# Patient Record
Sex: Female | Born: 1965 | Hispanic: Yes | Marital: Married | Smoking: Never smoker
Health system: Southern US, Community
[De-identification: ages and names within clinical notes are randomized; demographics above are authoritative.]

## PROBLEM LIST (undated history)

## (undated) DIAGNOSIS — K589 Irritable bowel syndrome without diarrhea: Secondary | ICD-10-CM

## (undated) DIAGNOSIS — K219 Gastro-esophageal reflux disease without esophagitis: Secondary | ICD-10-CM

## (undated) DIAGNOSIS — K5909 Other constipation: Secondary | ICD-10-CM

## (undated) DIAGNOSIS — K5792 Diverticulitis of intestine, part unspecified, without perforation or abscess without bleeding: Secondary | ICD-10-CM

## (undated) DIAGNOSIS — M5416 Radiculopathy, lumbar region: Secondary | ICD-10-CM

## (undated) DIAGNOSIS — IMO0001 Reserved for inherently not codable concepts without codable children: Secondary | ICD-10-CM

## (undated) HISTORY — DX: Gastro-esophageal reflux disease without esophagitis: K21.9

## (undated) HISTORY — PX: OTHER SURGICAL HISTORY: SHX169

## (undated) HISTORY — DX: Reserved for inherently not codable concepts without codable children: IMO0001

## (undated) HISTORY — DX: Irritable bowel syndrome, unspecified: K58.9

## (undated) HISTORY — DX: Radiculopathy, lumbar region: M54.16

## (undated) HISTORY — DX: Diverticulitis of intestine, part unspecified, without perforation or abscess without bleeding: K57.92

## (undated) HISTORY — DX: Other constipation: K59.09

---

## 1998-07-12 HISTORY — PX: BUNIONECTOMY: SHX129

## 2009-04-24 ENCOUNTER — Other Ambulatory Visit: Admission: RE | Admit: 2009-04-24 | Discharge: 2009-04-24 | Payer: Self-pay | Admitting: *Deleted

## 2009-05-26 ENCOUNTER — Encounter: Admission: RE | Admit: 2009-05-26 | Discharge: 2009-05-26 | Payer: Self-pay | Admitting: *Deleted

## 2010-04-27 ENCOUNTER — Other Ambulatory Visit: Admission: RE | Admit: 2010-04-27 | Discharge: 2010-04-27 | Payer: Self-pay | Admitting: Family Medicine

## 2010-06-29 ENCOUNTER — Encounter
Admission: RE | Admit: 2010-06-29 | Discharge: 2010-06-29 | Payer: Self-pay | Source: Home / Self Care | Attending: Internal Medicine | Admitting: Internal Medicine

## 2010-07-12 HISTORY — PX: BIOPSY TEAR SAC: PRO35

## 2010-10-12 ENCOUNTER — Ambulatory Visit (INDEPENDENT_AMBULATORY_CARE_PROVIDER_SITE_OTHER): Payer: BC Managed Care – PPO | Admitting: Internal Medicine

## 2010-10-12 ENCOUNTER — Encounter: Payer: Self-pay | Admitting: Internal Medicine

## 2010-10-12 VITALS — BP 118/82 | HR 85 | Temp 98.6°F | Wt 127.8 lb

## 2010-10-12 DIAGNOSIS — R51 Headache: Secondary | ICD-10-CM

## 2010-10-12 MED ORDER — GABAPENTIN 300 MG PO CAPS: 300.0000 mg | ORAL_CAPSULE | ORAL | Status: DC

## 2010-10-12 NOTE — Progress Notes (Signed)
  Subjective:    Patient ID: Karen Case, female    DOB: 21-Nov-1965, 45 y.o.   MRN: [id number]  HPI New patient Symptoms started about 2 months ago, she developed pain at the right upper eye lid. Along with the pain, she has tingling and a "anesthesia sensation" around the right eye, right cheek, right side of the face and neck. She went to her primary doctor, was prescribed antibiotics and nasal steroids, then refer to ophthalmologist who prescribed antibiotics by mouth. As the symptoms did not subside, she was referred to a sub-specialist ophthalmologist in [place], they just did a CT, chest x-ray, blood work and a PPD. They're talking about surgery to see "what is wrong" according to the patient. She is here for my opinion  Past Medical History  Diagnosis Date  . Birth control     husband's vasectomy   Past Surgical History  Procedure Date  . Bunionectomy 2000    Right   History   Social History  . Marital Status: Married    Spouse Name: N/A    Number of Children: N/A  . Years of Education: N/A   Occupational History  . home    . student     Social History Main Topics  . Smoking status: Never Smoker   . Smokeless tobacco: Not on file  . Alcohol Use: 0.0 oz/week  . Drug Use: Not on file  . Sexually Active: Not on file   Other Topics Concern  . Not on file   Social History Narrative   Original from [place], moved to [place] in 1994,  2 children       Review of Systems No fever or recent weight loss No history of facial rash No actual sinus symptoms like runny nose, sore throat or sinus congestion Some bilateral ear pressure and No neck pain per se although the tingling involves part of the neck      Objective:   Physical Exam Alert, oriented x3. Face is symmetric except for the right upper eyelid is slightly larger than the left. External ocular movements intact  throat is normal, uvula midline. Ears: Tympanic membranes normal TMJs without click Lungs  are clear to auscultation bilaterally CV: RRR, no murmur        Assessment & Plan:

## 2010-10-12 NOTE — Assessment & Plan Note (Signed)
Right eye lid pain along with a tingling feeling in the right face; ophthalmology workup in progress and according to the patient w/u negative so far I recommended patient to followup with ophthalmology but at the same time her symptoms suggest neuralgia.   Plan: Neurology referral Trial with Neurontin at bedtime (pain is worse at night)

## 2010-10-12 NOTE — Patient Instructions (Signed)
Call in 1 week, let me know how you are doing  Call if side effects

## 2010-11-06 ENCOUNTER — Other Ambulatory Visit: Payer: Self-pay | Admitting: Physician Assistant

## 2010-11-06 ENCOUNTER — Other Ambulatory Visit: Payer: Self-pay | Admitting: Unknown Physician Specialty

## 2010-11-06 DIAGNOSIS — IMO0002 Reserved for concepts with insufficient information to code with codable children: Secondary | ICD-10-CM

## 2010-11-09 ENCOUNTER — Ambulatory Visit
Admission: RE | Admit: 2010-11-09 | Discharge: 2010-11-09 | Disposition: A | Discharge status: Home / Self Care | Payer: BC Managed Care – PPO | Source: Ambulatory Visit | Attending: Physician Assistant | Admitting: Physician Assistant

## 2010-11-09 DIAGNOSIS — IMO0002 Reserved for concepts with insufficient information to code with codable children: Secondary | ICD-10-CM

## 2010-11-09 MED ORDER — GADOBENATE DIMEGLUMINE 529 MG/ML IV SOLN
11.0000 mL | Freq: Once | INTRAVENOUS | Status: AC | PRN
  Administered 2010-11-09: 11 mL via INTRAVENOUS

## 2010-12-21 ENCOUNTER — Ambulatory Visit (INDEPENDENT_AMBULATORY_CARE_PROVIDER_SITE_OTHER): Payer: BC Managed Care – PPO | Admitting: Internal Medicine

## 2010-12-21 ENCOUNTER — Encounter: Payer: Self-pay | Admitting: Internal Medicine

## 2010-12-21 DIAGNOSIS — R51 Headache: Secondary | ICD-10-CM

## 2010-12-21 MED ORDER — HYDROCODONE-ACETAMINOPHEN 2.5-500 MG PO TABS: 1.0000 | ORAL_TABLET | Freq: Four times a day (QID) | ORAL | Status: DC | PRN

## 2010-12-21 NOTE — Patient Instructions (Signed)
Tylenol-motrin for ear pain If the pain continue, use hydrocodone (drowsinress) Nasonex: 2 sprays on each side of the nose daily, call for a refill if needed

## 2010-12-21 NOTE — Assessment & Plan Note (Addendum)
Presents today with right otalgia and R>L sinus pressure The patient was recently diagnosed with possible trigeminal neuralgia, on Neurontin. She also just had a biopsy of the right lacrimal gland. She does not seem to have sinusitis, otitis or a TMJ issue. No dental problems that I can tell. Otalgia may be related to primary process which is neuralgia. Plan: Nasonex samples to alleviate the sinus symptoms Tylenol and Motrin, hydrocodone if pain continued. Patient will call if symptoms change, fever. nasal d/c [facility] Neu[facility]

## 2010-12-21 NOTE — Progress Notes (Signed)
  Subjective:    Patient ID: Karen Case, female    DOB: 03-18-1966, 45 y.o.   MRN: [id number]  HPI Today she complains of feet and had edema, feels like she is "retaining water" Also for the last day, has developed severe right otalgia described as "fluid in the ear" She was recently seen with the right facial pain, saw neurology, working diagnosis is neuralgia, currently on Neurontin. Had MRI of the brain recently ordered by neurology, reportedly it was okay but needs to be repeated in 3 months d/t some minor abnormalities (according to the patient). She also had a right lacrimal gland biopsy 12-16-10 under general anesthesia, results pending.  Past Medical History  Diagnosis Date  . Birth control     husband's vasectomy   Past Surgical History  Procedure Date  . Bunionectomy 2000    Right    Review of Systems Denies fevers Occasional dizziness, no cough, no nasal discharge, no URI type symptoms. No dental pain. Some discomfort in the face, R>L      Objective:   Physical Exam  Constitutional: She appears well-developed and well-nourished. No distress.  HENT:  Head: Normocephalic and atraumatic.  Right Ear: External ear normal.  Left Ear: External ear normal.  Nose: Nose normal.  Mouth/Throat: No oropharyngeal exudate.       TMJ without click or tenderness. Percussion of the teeth caused no pain. Face is symmetric, slightly tender over the right maxillary sinus  Neck: Normal range of motion. Neck supple.  Musculoskeletal: She exhibits no edema.  Skin: She is not diaphoretic.       No facial or otic rash           Assessment & Plan:  Reports feet and hand edema, on exam there is no objective evidence of pitting edema. Recommend observation and a low salt diet.

## 2011-01-21 ENCOUNTER — Other Ambulatory Visit: Payer: Self-pay | Admitting: Unknown Physician Specialty

## 2011-01-21 ENCOUNTER — Other Ambulatory Visit: Payer: Self-pay | Admitting: *Deleted

## 2011-01-21 DIAGNOSIS — R51 Headache: Secondary | ICD-10-CM

## 2011-01-21 DIAGNOSIS — R930 Abnormal findings on diagnostic imaging of skull and head, not elsewhere classified: Secondary | ICD-10-CM

## 2011-02-09 ENCOUNTER — Ambulatory Visit
Admission: RE | Admit: 2011-02-09 | Discharge: 2011-02-09 | Disposition: A | Discharge status: Home / Self Care | Payer: BC Managed Care – PPO | Source: Ambulatory Visit | Attending: *Deleted | Admitting: *Deleted

## 2011-02-09 ENCOUNTER — Other Ambulatory Visit: Payer: BC Managed Care – PPO

## 2011-02-09 DIAGNOSIS — R51 Headache: Secondary | ICD-10-CM

## 2011-02-09 DIAGNOSIS — R930 Abnormal findings on diagnostic imaging of skull and head, not elsewhere classified: Secondary | ICD-10-CM

## 2011-02-09 MED ORDER — GADOBENATE DIMEGLUMINE 529 MG/ML IV SOLN
12.0000 mL | Freq: Once | INTRAVENOUS | Status: AC | PRN
  Administered 2011-02-09: 12 mL via INTRAVENOUS

## 2011-06-14 ENCOUNTER — Other Ambulatory Visit: Payer: Self-pay | Admitting: Internal Medicine

## 2011-06-14 DIAGNOSIS — Z1231 Encounter for screening mammogram for malignant neoplasm of breast: Secondary | ICD-10-CM

## 2011-06-29 ENCOUNTER — Encounter: Payer: Self-pay | Admitting: Internal Medicine

## 2011-06-30 ENCOUNTER — Ambulatory Visit (INDEPENDENT_AMBULATORY_CARE_PROVIDER_SITE_OTHER): Payer: BC Managed Care – PPO | Admitting: Internal Medicine

## 2011-06-30 ENCOUNTER — Other Ambulatory Visit: Payer: Self-pay | Admitting: Internal Medicine

## 2011-06-30 ENCOUNTER — Encounter: Payer: Self-pay | Admitting: Internal Medicine

## 2011-06-30 DIAGNOSIS — R519 Headache, unspecified: Secondary | ICD-10-CM

## 2011-06-30 DIAGNOSIS — Z23 Encounter for immunization: Secondary | ICD-10-CM

## 2011-06-30 DIAGNOSIS — R1013 Epigastric pain: Secondary | ICD-10-CM

## 2011-06-30 DIAGNOSIS — M542 Cervicalgia: Secondary | ICD-10-CM

## 2011-06-30 DIAGNOSIS — K5909 Other constipation: Secondary | ICD-10-CM

## 2011-06-30 DIAGNOSIS — Z0000 Encounter for general adult medical examination without abnormal findings: Secondary | ICD-10-CM

## 2011-06-30 HISTORY — DX: Other constipation: K59.09

## 2011-06-30 MED ORDER — HYDROCODONE-ACETAMINOPHEN 2.5-500 MG PO TABS: 1.0000 | ORAL_TABLET | Freq: Four times a day (QID) | ORAL | Status: AC | PRN

## 2011-06-30 NOTE — Progress Notes (Signed)
  Subjective:    Patient ID: Karen Case, female    DOB: 1966-06-07, 45 y.o.   MRN: [id number]  HPI CPX X 2 months having early saciety-mild nausea- upper abdominal bloating feeling after meals, sx are worse if she over eats. Not taking nsaids  Past Medical History  Diagnosis Date  . Birth control     husband's vasectomy   Past Surgical History  Procedure Date  . Bunionectomy 2000    Right  . Skin biopsy     R eye lid    History   Social History  . Marital Status: Married    Spouse Name: N/A    Number of Children: 2  . Years of Education: N/A   Occupational History  . home    . student     Social History Main Topics  . Smoking status: Never Smoker   . Smokeless tobacco: Never Used  . Alcohol Use: 0.0 oz/week     socially   . Drug Use: No  . Sexually Active: Not on file   Other Topics Concern  . Not on file   Social History Narrative   Original from [place], moved to [place] in 1994 2 children     Family History  Problem Relation Age of Onset  . Hypertension      M  . Hyperlipidemia Neg Hx   . Coronary artery disease Neg Hx   . Stroke Neg Hx   . Colon cancer Neg Hx   . Breast cancer Neg Hx      Review of Systems No f/c, slt decreased appetite, no wt loss No CP (occ discomfort anteriorly when she stretches) No cough-SOB No vomiting-diarrhea-blood in stools occ has LLQ mild pain Periods irregular  occ GERD sx x 2 months No anxiety-depression    Objective:   Physical Exam  Constitutional: She is oriented to person, place, and time. She appears well-developed and well-nourished. No distress.  HENT:  Head: Normocephalic and atraumatic.  Neck: No thyromegaly present.  Cardiovascular: Normal rate, regular rhythm and normal heart sounds.   No murmur heard. Pulmonary/Chest: Effort normal and breath sounds normal. No respiratory distress. She has no wheezes. She exhibits no tenderness.  Abdominal: Soft. She exhibits no distension and no mass. There is no  rebound and no guarding.    Musculoskeletal: She exhibits no edema.  Neurological: She is alert and oriented to person, place, and time.  Skin: She is not diaphoretic.  Psychiatric: She has a normal mood and affect. Her behavior is normal. Judgment and thought content normal.       Assessment & Plan:

## 2011-06-30 NOTE — Assessment & Plan Note (Signed)
Today he also complains of neck pain on and off for a while, she was prescribed hydrocodone actually for facial pain but when she took it the neck pain responded very well. Request a prescription for hydrocodone, it was provided. Counseled about the possible overuse.

## 2011-06-30 NOTE — Patient Instructions (Addendum)
Please came back fasting: FLP CMP CBC TSH--- dx v70 prilosec every day x 6 weeks, if the stomach problem continue or if it gets worse: please call , will schedule a ultrasound

## 2011-06-30 NOTE — Assessment & Plan Note (Signed)
Td today Flu shot today Never had a cscope To see gyn soon, encouraged to discuss irregular periods and episodic RLQ pain (?ovulation) Has a MMG schedule  Diet-exercise discussed

## 2011-06-30 NOTE — Assessment & Plan Note (Signed)
Sx c/w dyspepsia Trial w/ prilosec If no better consider GB u/s and GI referral

## 2011-07-01 ENCOUNTER — Encounter: Payer: Self-pay | Admitting: Internal Medicine

## 2011-07-01 ENCOUNTER — Other Ambulatory Visit (INDEPENDENT_AMBULATORY_CARE_PROVIDER_SITE_OTHER): Payer: BC Managed Care – PPO

## 2011-07-01 DIAGNOSIS — Z0000 Encounter for general adult medical examination without abnormal findings: Secondary | ICD-10-CM

## 2011-07-01 LAB — COMPREHENSIVE METABOLIC PANEL
CO2: 30 mEq/L (ref 19–32)
Calcium: 9.2 mg/dL (ref 8.4–10.5)
Chloride: 104 mEq/L (ref 96–112)
GFR: 96.32 mL/min (ref >60.00)
Glucose, Bld: 95 mg/dL (ref 70–99)
Potassium: 3.8 mEq/L (ref 3.5–5.1)
Sodium: 141 mEq/L (ref 135–145)

## 2011-07-01 LAB — LIPID PANEL
Cholesterol: 178 mg/dL (ref 0–200)
LDL Cholesterol: 106 mg/dL — ABNORMAL HIGH (ref 0–99)
Total CHOL/HDL Ratio: 3

## 2011-07-01 LAB — CBC WITH DIFFERENTIAL/PLATELET
Eosinophils Relative: 1.6 % (ref 0.0–5.0)
HCT: 37.5 % (ref 36.0–46.0)
Hemoglobin: 13.1 g/dL (ref 12.0–15.0)
Lymphocytes Relative: 18.4 % (ref 12.0–46.0)
MCV: 95.8 fl (ref 78.0–100.0)
Monocytes Relative: 9.5 % (ref 3.0–12.0)
Neutrophils Relative %: 70.2 % (ref 43.0–77.0)
RBC: 3.91 Mil/uL (ref 3.87–5.11)
RDW: 12.2 % (ref 11.5–14.6)

## 2011-07-04 ENCOUNTER — Encounter: Payer: Self-pay | Admitting: Internal Medicine

## 2011-07-07 ENCOUNTER — Ambulatory Visit: Payer: BC Managed Care – PPO

## 2011-07-29 ENCOUNTER — Other Ambulatory Visit (HOSPITAL_COMMUNITY)
Admission: RE | Admit: 2011-07-29 | Discharge: 2011-07-29 | Disposition: A | Discharge status: Home / Self Care | Payer: BC Managed Care – PPO | Source: Ambulatory Visit | Attending: Obstetrics and Gynecology | Admitting: Obstetrics and Gynecology

## 2011-07-29 ENCOUNTER — Other Ambulatory Visit: Payer: Self-pay | Admitting: Obstetrics and Gynecology

## 2011-07-29 ENCOUNTER — Ambulatory Visit
Admission: RE | Admit: 2011-07-29 | Discharge: 2011-07-29 | Disposition: A | Discharge status: Home / Self Care | Payer: BC Managed Care – PPO | Source: Ambulatory Visit | Attending: Internal Medicine | Admitting: Internal Medicine

## 2011-07-29 DIAGNOSIS — Z1231 Encounter for screening mammogram for malignant neoplasm of breast: Secondary | ICD-10-CM

## 2011-07-29 DIAGNOSIS — Z01419 Encounter for gynecological examination (general) (routine) without abnormal findings: Secondary | ICD-10-CM | POA: Insufficient documentation

## 2012-07-28 ENCOUNTER — Other Ambulatory Visit: Payer: Self-pay | Admitting: Internal Medicine

## 2012-07-28 DIAGNOSIS — Z1231 Encounter for screening mammogram for malignant neoplasm of breast: Secondary | ICD-10-CM

## 2012-07-31 ENCOUNTER — Ambulatory Visit
Admission: RE | Admit: 2012-07-31 | Discharge: 2012-07-31 | Disposition: A | Discharge status: Home / Self Care | Payer: BC Managed Care – PPO | Source: Ambulatory Visit | Attending: Internal Medicine | Admitting: Internal Medicine

## 2012-07-31 DIAGNOSIS — Z1231 Encounter for screening mammogram for malignant neoplasm of breast: Secondary | ICD-10-CM

## 2012-08-11 ENCOUNTER — Other Ambulatory Visit (HOSPITAL_COMMUNITY)
Admission: RE | Admit: 2012-08-11 | Discharge: 2012-08-11 | Disposition: A | Discharge status: Home / Self Care | Payer: BC Managed Care – PPO | Source: Ambulatory Visit | Attending: Obstetrics and Gynecology | Admitting: Obstetrics and Gynecology

## 2012-08-11 ENCOUNTER — Other Ambulatory Visit: Payer: Self-pay | Admitting: Obstetrics and Gynecology

## 2012-08-11 DIAGNOSIS — Z01419 Encounter for gynecological examination (general) (routine) without abnormal findings: Secondary | ICD-10-CM | POA: Insufficient documentation

## 2012-08-11 DIAGNOSIS — Z1151 Encounter for screening for human papillomavirus (HPV): Secondary | ICD-10-CM | POA: Insufficient documentation

## 2012-08-16 ENCOUNTER — Encounter: Payer: Self-pay | Admitting: Internal Medicine

## 2012-08-16 ENCOUNTER — Ambulatory Visit (INDEPENDENT_AMBULATORY_CARE_PROVIDER_SITE_OTHER): Payer: BC Managed Care – PPO | Admitting: Internal Medicine

## 2012-08-16 VITALS — BP 118/74 | HR 86 | Temp 98.1°F | Ht 63.25 in | Wt 127.0 lb

## 2012-08-16 DIAGNOSIS — Z0000 Encounter for general adult medical examination without abnormal findings: Secondary | ICD-10-CM

## 2012-08-16 DIAGNOSIS — R1013 Epigastric pain: Secondary | ICD-10-CM

## 2012-08-16 MED ORDER — OMEPRAZOLE 40 MG PO CPDR: 40.0000 mg | DELAYED_RELEASE_CAPSULE | Freq: Every day | ORAL | Status: DC

## 2012-08-16 NOTE — Assessment & Plan Note (Addendum)
Continue with several GI symptoms: GERD, chronic constipation, feeling bloated. Plan: Take PPIs consistently, increase omeprazole from 20-40 mg.  Metamucil and MiraLax daily. Refer to GI --->  IBS?

## 2012-08-16 NOTE — Progress Notes (Signed)
  Subjective:    Patient ID: Karen Case, female    DOB: 03-Oct-1965, 47 y.o.   MRN: [id number]  HPI Complete physical exam  Past Medical History  Diagnosis Date  . Birth control     husband's vasectomy   Past Surgical History  Procedure Date  . Bunionectomy 2000    Right  . Skin biopsy     R eye lid    History   Social History  . Marital Status: Married    Spouse Name: N/A    Number of Children: 2  . Years of Education: N/A   Occupational History  . home , student   .      Social History Main Topics  . Smoking status: Never Smoker   . Smokeless tobacco: Never Used  . Alcohol Use: 0.0 oz/week     Comment: socially   . Drug Use: No  . Sexually Active: Not on file   Other Topics Concern  . Not on file   Social History Narrative   Original from [place], moved to [place] in 1994 ----2 daughters ---Diet: healthy ---Exercise: still active   Family History  Problem Relation Age of Onset  . Hypertension      M  . Hyperlipidemia Neg Hx   . Coronary artery disease Neg Hx   . Stroke Neg Hx   . Colon cancer Neg Hx   . Breast cancer Neg Hx      Review of Systems In general feels well, she still has acid reflux very frequently. She tried Prilosec 20 mg which helped but she stopped taking it consistently. Also has chronic constipation, occasionally gets really constipated and takes Dulcolax. She feels bloated frequently. Denies any nausea, vomiting or blood per rectum. No depression or anxiety. No fever chills No chest pain or shortness of breath. Next    Objective:   Physical Exam General -- alert, well-developed, and well-nourished.   Neck --no thyromegaly  Lungs -- normal respiratory effort, no intercostal retractions, no accessory muscle use, and normal breath sounds.   Heart-- normal rate, regular rhythm, no murmur, and no gallop.   Abdomen--soft, non-tender, no distention, no masses, no HSM, no guarding, and no rigidity.   Extremities-- no pretibial edema  bilaterally  Neurologic-- alert & oriented X3 and strength normal in all extremities. Psych-- Cognition and judgment appear intact. Alert and cooperative with normal attention span and concentration.  not anxious appearing and not depressed appearing.       Assessment & Plan:

## 2012-08-16 NOTE — Patient Instructions (Signed)
Please come back fasting: FLP, TSH, CBC, BMP--- dx V70 ---- MiraLax 17 g OTC 17 every day with lots of fluids Metamucil capsules, 2 a day.

## 2012-08-16 NOTE — Assessment & Plan Note (Addendum)
Td 2012 Never had a cscope Female care per gyn, MMG (-) 07-2012, PAP last week   Patient reports she was diagnosed with premenopause, taking BCPs, despite that she  has only 2 or 3 periods a year. Interestingly, her headaches are gone since she started [facility] Diet-exercise discussed  Labs

## 2012-08-17 ENCOUNTER — Encounter: Payer: Self-pay | Admitting: Gastroenterology

## 2012-08-17 ENCOUNTER — Other Ambulatory Visit (INDEPENDENT_AMBULATORY_CARE_PROVIDER_SITE_OTHER): Payer: BC Managed Care – PPO

## 2012-08-17 DIAGNOSIS — Z0000 Encounter for general adult medical examination without abnormal findings: Secondary | ICD-10-CM

## 2012-08-17 LAB — CBC WITH DIFFERENTIAL/PLATELET
Basophils Absolute: 0 10*3/uL (ref 0.0–0.1)
Eosinophils Relative: 2.5 % (ref 0.0–5.0)
Hemoglobin: 12.7 g/dL (ref 12.0–15.0)
MCHC: 33.9 g/dL (ref 30.0–36.0)
Monocytes Absolute: 0.3 10*3/uL (ref 0.1–1.0)
Monocytes Relative: 8 % (ref 3.0–12.0)
Neutro Abs: 2.4 10*3/uL (ref 1.4–7.7)
RDW: 12.8 % (ref 11.5–14.6)
WBC: 4.1 10*3/uL — ABNORMAL LOW (ref 4.5–10.5)

## 2012-08-17 LAB — LIPID PANEL
Cholesterol: 169 mg/dL (ref 0–200)
HDL: 59.1 mg/dL (ref >39.00)
LDL Cholesterol: 97 mg/dL (ref 0–99)
Total CHOL/HDL Ratio: 3
VLDL: 12.6 mg/dL (ref 0.0–40.0)

## 2012-08-17 LAB — BASIC METABOLIC PANEL
CO2: 28 mEq/L (ref 19–32)
Chloride: 101 mEq/L (ref 96–112)
Creatinine, Ser: 0.8 mg/dL (ref 0.4–1.2)
GFR: 77.65 mL/min (ref >60.00)
Glucose, Bld: 79 mg/dL (ref 70–99)

## 2012-08-17 LAB — TSH: TSH: 0.93 u[IU]/mL (ref 0.35–5.50)

## 2012-08-21 ENCOUNTER — Encounter: Payer: Self-pay | Admitting: *Deleted

## 2012-09-04 ENCOUNTER — Ambulatory Visit (INDEPENDENT_AMBULATORY_CARE_PROVIDER_SITE_OTHER): Payer: BC Managed Care – PPO | Admitting: Gastroenterology

## 2012-09-04 ENCOUNTER — Encounter: Payer: Self-pay | Admitting: Gastroenterology

## 2012-09-04 VITALS — BP 90/60 | HR 80

## 2012-09-04 DIAGNOSIS — K5900 Constipation, unspecified: Secondary | ICD-10-CM

## 2012-09-04 DIAGNOSIS — R109 Unspecified abdominal pain: Secondary | ICD-10-CM

## 2012-09-04 MED ORDER — LINACLOTIDE 145 MCG PO CAPS: 145.0000 ug | ORAL_CAPSULE | Freq: Every day | ORAL | Status: DC

## 2012-09-04 NOTE — Progress Notes (Signed)
History of Present Illness: This is a 47 year old female who has chronic constipation. She her symptoms have been present for many years and have worsened over the past few years. She reports epigastric pain and lower abdominal pain, occasionally her symptoms are burning. Pain is not routinely relieved with a bowel movement. She notes bloating and increased gas as well. Omeprazole has not been effective in controlling her symptoms. She tried MiraLax daily which was not effective. She tried Ducolax which led to abdominal cramping. Recent blood work obtained by Dr. Drue Novel was unremarkable. Denies weight loss, diarrhea, change in stool caliber, melena, hematochezia, nausea, vomiting, dysphagia, reflux symptoms, chest pain.  Review of Systems: Pertinent positive and negative review of systems were noted in the above HPI section. All other review of systems were otherwise negative.  Current Medications, Allergies, Past Medical History, Past Surgical History, Family History and Social History were reviewed in [employer] record.  Physical Exam: General: Well developed , well nourished, no acute distress Head: Normocephalic and atraumatic Eyes:  sclerae anicteric, EOMI Ears: Normal auditory acuity Mouth: No deformity or lesions Neck: Supple, no masses or thyromegaly Lungs: Clear throughout to auscultation Heart: Regular rate and rhythm; no murmurs, rubs or bruits Abdomen: Soft, mild tenderness in the epigastrium and lower abdomen-right greater than left-no rebound or guarding and non distended. No masses, hepatosplenomegaly or hernias noted. Normal Bowel sounds Rectal: Deferred Musculoskeletal: Symmetrical with no gross deformities  Skin: No lesions on visible extremities Pulses:  Normal pulses noted Extremities: No clubbing, cyanosis, edema or deformities noted Neurological: Alert oriented x 4, grossly nonfocal Cervical Nodes:  No significant cervical adenopathy Inguinal Nodes:  No significant inguinal adenopathy Psychological:  Alert and cooperative. Normal mood and affect  Assessment and Recommendations:  1. Chronic constipation and abdominal pain in multiple sites. Begin Linzess daily. Obtain stool Hemoccults. Schedule abdominal/pelvic CT. Consider colonoscopy. Discontinue omeprazole. Return office visit in 4 weeks.

## 2012-09-04 NOTE — Patient Instructions (Addendum)
Follow instructions on Hemoccult cards and mail them back to [place] when finished.   Stop taking your omeprazole.   You have been given information on constipation and a high fiber diet.   Start Linzess samples one tablet by mouth once daily x 2 weeks.    You have been scheduled for a CT scan of the abdomen and pelvis at [facility] ([address]---this is in the same building as Architectural [facility]).   You are scheduled on 09/06/12 at 10:00am. You should arrive 15 minutes prior to your appointment time for registration. Please follow the written instructions below on the day of your exam:  WARNING: IF YOU ARE ALLERGIC TO IODINE/X-RAY DYE, PLEASE NOTIFY RADIOLOGY IMMEDIATELY AT [phone]! YOU WILL BE GIVEN A 13 HOUR PREMEDICATION PREP.  1) Do not eat or drink anything after 6:00am (4 hours prior to your test) 2) You have been given 2 bottles of oral contrast to drink. The solution may taste better if refrigerated, but do NOT add ice or any other liquid to this solution. Shake well before drinking.    Drink 1 bottle of contrast @ 8:00am (2 hours prior to your exam)  Drink 1 bottle of contrast @ 9:00am (1 hour prior to your exam)  You may take any medications as prescribed with a small amount of water except for the following: Metformin, Glucophage, Glucovance, Avandamet, Riomet, Fortamet, Actoplus Met, Janumet, Glumetza or Metaglip. The above medications must be held the day of the exam AND 48 hours after the exam.  The purpose of you drinking the oral contrast is to aid in the visualization of your intestinal tract. The contrast solution may cause some diarrhea. Before your exam is started, you will be given a small amount of fluid to drink. Depending on your individual set of symptoms, you may also receive an intravenous injection of x-ray contrast/dye. Plan on being at [facility] for 30 minutes or long, depending on the type of exam you are having performed.  If you  have any questions regarding your exam or if you need to reschedule, you may call the CT department at [phone] between the hours of 8:00 am and 5:00 pm, Monday-Friday.  ________________________________________________________________________  Thank you for choosing me and [facility].  Venita Lick. Pleas Koch., MD., Clementeen Graham

## 2012-09-06 ENCOUNTER — Ambulatory Visit (INDEPENDENT_AMBULATORY_CARE_PROVIDER_SITE_OTHER)
Admission: RE | Admit: 2012-09-06 | Discharge: 2012-09-06 | Disposition: A | Discharge status: Home / Self Care | Payer: BC Managed Care – PPO | Source: Ambulatory Visit | Attending: Gastroenterology | Admitting: Gastroenterology

## 2012-09-06 DIAGNOSIS — K5900 Constipation, unspecified: Secondary | ICD-10-CM

## 2012-09-06 DIAGNOSIS — R109 Unspecified abdominal pain: Secondary | ICD-10-CM

## 2012-09-06 MED ORDER — IOHEXOL 300 MG/ML  SOLN
100.0000 mL | Freq: Once | INTRAMUSCULAR | Status: AC | PRN
  Administered 2012-09-06: 100 mL via INTRAVENOUS

## 2012-09-13 ENCOUNTER — Other Ambulatory Visit (INDEPENDENT_AMBULATORY_CARE_PROVIDER_SITE_OTHER): Payer: BC Managed Care – PPO

## 2012-09-13 DIAGNOSIS — K5900 Constipation, unspecified: Secondary | ICD-10-CM

## 2012-09-13 DIAGNOSIS — R109 Unspecified abdominal pain: Secondary | ICD-10-CM

## 2012-09-14 LAB — HEMOCCULT SLIDES (X 3 CARDS)
Fecal Occult Blood: NEGATIVE
OCCULT 1: NEGATIVE
OCCULT 4: NEGATIVE
OCCULT 5: NEGATIVE

## 2013-07-19 ENCOUNTER — Other Ambulatory Visit: Payer: Self-pay

## 2013-07-19 DIAGNOSIS — Z1231 Encounter for screening mammogram for malignant neoplasm of breast: Secondary | ICD-10-CM

## 2013-08-06 ENCOUNTER — Ambulatory Visit
Admission: RE | Admit: 2013-08-06 | Discharge: 2013-08-06 | Disposition: A | Discharge status: Home / Self Care | Payer: Self-pay | Source: Ambulatory Visit

## 2013-08-06 DIAGNOSIS — Z1231 Encounter for screening mammogram for malignant neoplasm of breast: Secondary | ICD-10-CM

## 2013-08-10 ENCOUNTER — Other Ambulatory Visit: Payer: Self-pay | Admitting: Obstetrics and Gynecology

## 2013-08-10 ENCOUNTER — Other Ambulatory Visit (HOSPITAL_COMMUNITY)
Admission: RE | Admit: 2013-08-10 | Discharge: 2013-08-10 | Disposition: A | Discharge status: Home / Self Care | Payer: BC Managed Care – PPO | Source: Ambulatory Visit | Attending: Obstetrics and Gynecology | Admitting: Obstetrics and Gynecology

## 2013-08-10 DIAGNOSIS — Z01419 Encounter for gynecological examination (general) (routine) without abnormal findings: Secondary | ICD-10-CM | POA: Insufficient documentation

## 2013-08-20 ENCOUNTER — Encounter: Payer: BC Managed Care – PPO | Admitting: Internal Medicine

## 2013-08-28 ENCOUNTER — Encounter: Payer: BC Managed Care – PPO | Admitting: Internal Medicine

## 2013-09-10 ENCOUNTER — Telehealth: Payer: Self-pay

## 2013-09-10 NOTE — Telephone Encounter (Addendum)
Left message for call back Non identifiable  Medication and allergies:  Reviewed and updated  90 day supply/mail order: na Local pharmacy: [facility] [address]   Immunizations due:  Flu?  A/P:   No changes to FH, PSH or Personal Hx Flu vaccine--did not get this season Pap--07/2013--benign findings  MMG--07/2013--neg bi rads Tdap--06/2011  To Discuss with Provider: Not at this time

## 2013-09-11 ENCOUNTER — Ambulatory Visit (INDEPENDENT_AMBULATORY_CARE_PROVIDER_SITE_OTHER): Payer: BC Managed Care – PPO | Admitting: Internal Medicine

## 2013-09-11 ENCOUNTER — Encounter: Payer: Self-pay | Admitting: Internal Medicine

## 2013-09-11 VITALS — BP 100/64 | HR 77 | Temp 97.9°F | Ht 63.0 in | Wt 130.0 lb

## 2013-09-11 DIAGNOSIS — Z0000 Encounter for general adult medical examination without abnormal findings: Secondary | ICD-10-CM

## 2013-09-11 DIAGNOSIS — K5909 Other constipation: Secondary | ICD-10-CM

## 2013-09-11 NOTE — Patient Instructions (Signed)
Schedule labs, fasting: FLP, CBC, TSH, C80, folic acid: --- dx E23   Metamucil 2 capsules daily with fluids  MiraLax 17 g daily with fluids  If you're not improving let us know.  Take Tylenol as needed for pain, if the pain at the thorax is not improving let us know   Next visit is for a physical exam in 1 year,  fasting Please make an appointment

## 2013-09-11 NOTE — Assessment & Plan Note (Addendum)
Was referred to GI w/ dyspepsia, eventually dx w chronic constipation, CT of the abdomen was unremarkable. She was prescribed linzess twice a day but it causes severe diarrhea consequently she discontinued it. Currently taking Dulcolax as needed but it caused cramps. Already has a high fiber diet Plan: High fiber diet, MiraLax Recommend to call if symptoms are not improving, re refer to GI as they wereconsidering a colonoscopy.

## 2013-09-11 NOTE — Progress Notes (Signed)
Subjective:    Patient ID: Karen Case, female    DOB: [birth year], 48 y.o.   MRN: [id number]  DOS:  09/11/2013 Type of  visit: CPX --Also represent continue dyspepsia, constipation. See assessment and plan. --Reports mild fatigue, she is able to do her Zumba classes without dyspnea on exertion or CP  but after she finished she gets really tired more than what she thinks is normal. --2 weeks history of on and off pain in the mid thoracic area, no radiation, symptoms   with or without bending her thorax.  ROS Diet-- regular to healthy Exercise-- x 3/week No  CP, SOB No palpitations, no lower extremity edema + Thirst  Denies  nausea, vomiting   Denies  blood in the stools No GERD  Sx. (-) cough, sputum production No anxiety, mild depression (weather related?) states is not a  major issue. Denies suicidal ideas     Past Medical History  Diagnosis Date  . Birth control     husband's vasectomy  . GERD (gastroesophageal reflux disease)   . Anxiety   . Chronic constipation 06/30/2011    Past Surgical History  Procedure Laterality Date  . Bunionectomy  2000    Right  . Skin biopsy      R eye lid     History   Social History  . Marital Status: Married    Spouse Name: N/A    Number of Children: 2  . Years of Education: N/A   Occupational History  . home , student   .      Social History Main Topics  . Smoking status: Never Smoker   . Smokeless tobacco: Never Used  . Alcohol Use: No     Comment: socially   . Drug Use: No  . Sexual Activity: Not on file   Other Topics Concern  . Not on file   Social History Narrative   Original from [place], moved to [place] in 1994    2 daughters (teens)     Family History  Problem Relation Age of Onset  . Hypertension Mother     M  . Hyperlipidemia Neg Hx   . Coronary artery disease Neg Hx   . Stroke Neg Hx   . Colon cancer Neg Hx   . Breast cancer Neg Hx        Medication List       This list is accurate as of:  09/11/13  5:05 PM.  Always use your most recent med list.               Linaclotide 145 MCG Caps capsule  Commonly known as:  LINZESS  Take 1 capsule (145 mcg total) by mouth daily.     norethindrone-ethinyl estradiol 1-20 MG-MCG tablet  Commonly known as:  JUNEL FE,GILDESS FE,LOESTRIN FE  Take 1 tablet by mouth daily.           Objective:   Physical Exam  Musculoskeletal:       Arms:  BP 100/64  Pulse 77  Temp(Src) 97.9 F (36.6 C)  Ht 5\' 3"  (1.6 m)  Wt 130 lb (58.968 kg)  BMI 23.03 kg/m2  SpO2 97% General -- alert, well-developed, NAD.  Neck --no thyromegaly , normal carotid pulse  HEENT-- Not pale.  Lungs -- normal respiratory effort, no intercostal retractions, no accessory muscle use, and normal breath sounds.  Heart-- normal rate, regular rhythm, no murmur.  Abdomen-- Not distended, good bowel sounds,soft, non-tender. Extremities-- no pretibial edema  bilaterally  Neurologic--  alert & oriented X3. Speech normal, gait normal, strength normal in all extremities.  Psych-- Cognition and judgment appear intact. Cooperative with normal attention span and concentration. No anxious or depressed appearing.      Assessment & Plan:  Thoracic pain, Symptoms started 2 weeks ago, no radiation, recommend patient to call me if not improving in the next 2 weeks, will need further eval.

## 2013-09-11 NOTE — Progress Notes (Signed)
Pre visit review using our clinic review tool, if applicable. No additional management support is needed unless otherwise documented below in the visit note. 

## 2013-09-11 NOTE — Assessment & Plan Note (Signed)
Td 2012 Never had a cscope Female care per gyn, MMG (-) 07-2013, PAP 07-2013 at gyn   Patient reports she was diagnosed with pre menopause s/p BCPs, occ hot flashes   Diet-exercise discussed  Labs Including a O83 and folic acid do do perceive fatigue, see history of present illness. She also reports being more thirsty than usual, we are checking a blood sugar.

## 2013-09-12 ENCOUNTER — Other Ambulatory Visit (INDEPENDENT_AMBULATORY_CARE_PROVIDER_SITE_OTHER): Payer: BC Managed Care – PPO

## 2013-09-12 DIAGNOSIS — Z0000 Encounter for general adult medical examination without abnormal findings: Secondary | ICD-10-CM

## 2013-09-12 LAB — CBC WITH DIFFERENTIAL/PLATELET
BASOS PCT: 0.5 % (ref 0.0–3.0)
Basophils Absolute: 0 10*3/uL (ref 0.0–0.1)
EOS ABS: 0.2 10*3/uL (ref 0.0–0.7)
Eosinophils Relative: 3.6 % (ref 0.0–5.0)
HCT: 41.3 % (ref 36.0–46.0)
Hemoglobin: 13.9 g/dL (ref 12.0–15.0)
LYMPHS PCT: 34.7 % (ref 12.0–46.0)
Lymphs Abs: 1.5 10*3/uL (ref 0.7–4.0)
MCHC: 33.6 g/dL (ref 30.0–36.0)
MCV: 97.2 fl (ref 78.0–100.0)
Monocytes Absolute: 0.4 10*3/uL (ref 0.1–1.0)
Monocytes Relative: 8.8 % (ref 3.0–12.0)
NEUTROS PCT: 52.4 % (ref 43.0–77.0)
Neutro Abs: 2.3 10*3/uL (ref 1.4–7.7)
Platelets: 339 10*3/uL (ref 150.0–400.0)
RBC: 4.26 Mil/uL (ref 3.87–5.11)
RDW: 12.3 % (ref 11.5–14.6)
WBC: 4.5 10*3/uL (ref 4.5–10.5)

## 2013-09-12 LAB — VITAMIN B12: VITAMIN B 12: 324 pg/mL (ref 211–911)

## 2013-09-12 LAB — TSH: TSH: 1.36 u[IU]/mL (ref 0.35–5.50)

## 2013-09-12 LAB — LIPID PANEL
CHOLESTEROL: 191 mg/dL (ref 0–200)
HDL: 50.2 mg/dL (ref >39.00)
LDL CALC: 124 mg/dL — AB (ref 0–99)
Total CHOL/HDL Ratio: 4
Triglycerides: 85 mg/dL (ref 0.0–149.0)
VLDL: 17 mg/dL (ref 0.0–40.0)

## 2013-09-12 LAB — FOLATE: FOLATE: 21.6 ng/mL (ref >5.9)

## 2013-09-13 ENCOUNTER — Encounter: Payer: Self-pay | Admitting: *Deleted

## 2014-08-09 ENCOUNTER — Other Ambulatory Visit (HOSPITAL_COMMUNITY)
Admission: RE | Admit: 2014-08-09 | Discharge: 2014-08-09 | Disposition: A | Discharge status: Home / Self Care | Payer: BLUE CROSS/BLUE SHIELD | Source: Ambulatory Visit | Attending: Obstetrics and Gynecology | Admitting: Obstetrics and Gynecology

## 2014-08-09 ENCOUNTER — Other Ambulatory Visit: Payer: Self-pay | Admitting: Obstetrics & Gynecology

## 2014-08-09 DIAGNOSIS — Z01419 Encounter for gynecological examination (general) (routine) without abnormal findings: Secondary | ICD-10-CM | POA: Diagnosis present

## 2014-08-12 LAB — CYTOLOGY - PAP

## 2014-08-22 ENCOUNTER — Ambulatory Visit (INDEPENDENT_AMBULATORY_CARE_PROVIDER_SITE_OTHER): Payer: BLUE CROSS/BLUE SHIELD | Admitting: Internal Medicine

## 2014-08-22 ENCOUNTER — Encounter: Payer: Self-pay | Admitting: Internal Medicine

## 2014-08-22 VITALS — BP 105/73 | HR 89 | Temp 98.2°F | Ht 63.0 in | Wt 127.1 lb

## 2014-08-22 DIAGNOSIS — M545 Low back pain, unspecified: Secondary | ICD-10-CM | POA: Insufficient documentation

## 2014-08-22 MED ORDER — CYCLOBENZAPRINE HCL 10 MG PO TABS: 10.0000 mg | ORAL_TABLET | Freq: Every evening | ORAL | Status: DC | PRN

## 2014-08-22 MED ORDER — HYDROCODONE-ACETAMINOPHEN 5-325 MG PO TABS: 1.0000 | ORAL_TABLET | Freq: Every evening | ORAL | Status: DC | PRN

## 2014-08-22 MED ORDER — PREDNISONE 10 MG PO TABS: ORAL_TABLET | ORAL | Status: DC

## 2014-08-22 NOTE — Progress Notes (Signed)
Pre visit review using our clinic review tool, if applicable. No additional management support is needed unless otherwise documented below in the visit note. 

## 2014-08-22 NOTE — Patient Instructions (Signed)
Take prednisone as prescribed For pain take OTC Tylenol 500 mg 2 tablets every 8 hours as needed At nighttime take Flexeril, a muscle relaxant. Will cause drowsiness. If the pain continue use hydrocodone as needed, again will cause drowsiness  Call me if you are not gradually improving in the next 2 weeks, call anytime if you get  worse.  Next physical exam by March 2016, please make an appointment

## 2014-08-22 NOTE — Progress Notes (Signed)
Subjective:    Patient ID: Karen Case, female    DOB: 02/02/1966, 49 y.o.   MRN: [id number]  DOS:  08/22/2014 Type of visit - description : acute Interval history: Symptoms started approximately 3 weeks ago with mild left lower back pain, symptoms got much worse 3 days after she shoveling snow. The pain is moderate to intense, decrease with Aleve and OTC local patches but never goes away. It is sharp, increased by any sort of movement: Walking, bending, twisting her torso. Pain is also present at night.   Review of Systems Denies fever or chills No abdominal pain nausea vomiting No rash in her back No dysuria or gross hematuria.   Past Medical History  Diagnosis Date  . Birth control     husband's vasectomy  . GERD (gastroesophageal reflux disease)   . Anxiety   . Chronic constipation 06/30/2011    Past Surgical History  Procedure Laterality Date  . Bunionectomy  2000    Right  . Skin biopsy      R eye lid     History   Social History  . Marital Status: Married    Spouse Name: N/A  . Number of Children: 2  . Years of Education: N/A   Occupational History  . home , student   .      Social History Main Topics  . Smoking status: Never Smoker   . Smokeless tobacco: Never Used  . Alcohol Use: No     Comment: socially   . Drug Use: No  . Sexual Activity: Not on file   Other Topics Concern  . Not on file   Social History Narrative   Original from [place], moved to [place] in 1994    2 daughters (teens)        Medication List       This list is accurate as of: 08/22/14 11:59 PM.  Always use your most recent med list.               cyclobenzaprine 10 MG tablet  Commonly known as:  FLEXERIL  Take 1 tablet (10 mg total) by mouth at bedtime as needed for muscle spasms.     HYDROcodone-acetaminophen 5-325 MG per tablet  Commonly known as:  NORCO/VICODIN  Take 1-2 tablets by mouth at bedtime as needed.     predniSONE 10 MG tablet  Commonly known as:   DELTASONE  4 tablets x 2 days, 3 tabs x 2 days, 2 tabs x 2 days, 1 tab x 2 days           Objective:   Physical Exam  Constitutional: She is oriented to person, place, and time. She appears well-developed and well-nourished. She appears distressed ( + antalgic gait and posture ).  Abdominal: Soft. Bowel sounds are normal. She exhibits no distension and no mass. There is no tenderness. There is no rebound and no guarding.  Musculoskeletal: She exhibits no edema.  Slightly TTP at the left sacroiliac area  Neurological: She is alert and oriented to person, place, and time. She displays normal reflexes. No cranial nerve deficit. She exhibits normal muscle tone. Coordination normal.  Straight leg test neg  Skin: Skin is warm and dry. She is not diaphoretic.     Psychiatric: She has a normal mood and affect. Her behavior is normal. Judgment and thought content normal.         Assessment & Plan:   Problem List Items Addressed This Visit  Other   Lumbalgia - Primary    Acute lumbalgia for 3 weeks, exacerbated after snow shoveling. Neurological exam normal. DDX includes sacroiliac syndrome, radiculopathy, sciatica. Plan: Prednisone, Tylenol, Flexeril, hydrocodone, heating pad. See instructions.        Relevant Medications   predniSONE (DELTASONE) tablet   cyclobenzaprine (FLEXERIL) tablet   vicodin 5-325-- 1-2 q 8 prn

## 2014-08-22 NOTE — Assessment & Plan Note (Signed)
Acute lumbalgia for 3 weeks, exacerbated after snow shoveling. Neurological exam normal. DDX includes sacroiliac syndrome, radiculopathy, sciatica. Plan: Prednisone, Tylenol, Flexeril, hydrocodone, heating pad. See instructions.

## 2014-09-16 ENCOUNTER — Encounter: Payer: BC Managed Care – PPO | Admitting: Internal Medicine

## 2014-10-16 ENCOUNTER — Other Ambulatory Visit: Payer: Self-pay

## 2014-10-30 ENCOUNTER — Telehealth: Payer: Self-pay | Admitting: Internal Medicine

## 2014-10-30 NOTE — Telephone Encounter (Signed)
Pre Visit letter sent  °

## 2014-11-20 ENCOUNTER — Encounter: Payer: Self-pay | Admitting: *Deleted

## 2014-11-20 ENCOUNTER — Telehealth: Payer: Self-pay | Admitting: *Deleted

## 2014-11-20 NOTE — Telephone Encounter (Signed)
Pre-Visit Call completed with patient and chart updated.   Pre-Visit Info documented in Specialty Comments under [employer].    

## 2014-11-22 ENCOUNTER — Encounter: Payer: BLUE CROSS/BLUE SHIELD | Admitting: Internal Medicine

## 2015-03-28 ENCOUNTER — Ambulatory Visit (INDEPENDENT_AMBULATORY_CARE_PROVIDER_SITE_OTHER): Payer: BLUE CROSS/BLUE SHIELD | Admitting: Internal Medicine

## 2015-03-28 ENCOUNTER — Encounter: Payer: Self-pay | Admitting: Internal Medicine

## 2015-03-28 ENCOUNTER — Ambulatory Visit (HOSPITAL_BASED_OUTPATIENT_CLINIC_OR_DEPARTMENT_OTHER)
Admission: RE | Admit: 2015-03-28 | Discharge: 2015-03-28 | Disposition: A | Discharge status: Home / Self Care | Payer: BLUE CROSS/BLUE SHIELD | Source: Ambulatory Visit | Attending: Internal Medicine | Admitting: Internal Medicine

## 2015-03-28 ENCOUNTER — Telehealth: Payer: Self-pay | Admitting: *Deleted

## 2015-03-28 VITALS — BP 116/74 | HR 67 | Temp 98.0°F | Ht 63.0 in | Wt 130.4 lb

## 2015-03-28 DIAGNOSIS — R109 Unspecified abdominal pain: Secondary | ICD-10-CM | POA: Diagnosis not present

## 2015-03-28 DIAGNOSIS — M545 Low back pain, unspecified: Secondary | ICD-10-CM

## 2015-03-28 DIAGNOSIS — R319 Hematuria, unspecified: Secondary | ICD-10-CM | POA: Diagnosis not present

## 2015-03-28 DIAGNOSIS — Z09 Encounter for follow-up examination after completed treatment for conditions other than malignant neoplasm: Secondary | ICD-10-CM

## 2015-03-28 LAB — CBC WITH DIFFERENTIAL/PLATELET
BASOS PCT: 0.5 % (ref 0.0–3.0)
Basophils Absolute: 0 10*3/uL (ref 0.0–0.1)
Eosinophils Absolute: 0.1 10*3/uL (ref 0.0–0.7)
Eosinophils Relative: 2.1 % (ref 0.0–5.0)
HEMATOCRIT: 38.2 % (ref 36.0–46.0)
Hemoglobin: 13.1 g/dL (ref 12.0–15.0)
LYMPHS PCT: 32.7 % (ref 12.0–46.0)
Lymphs Abs: 1.6 10*3/uL (ref 0.7–4.0)
MCHC: 34.3 g/dL (ref 30.0–36.0)
MCV: 95.3 fl (ref 78.0–100.0)
MONOS PCT: 7.3 % (ref 3.0–12.0)
Monocytes Absolute: 0.3 10*3/uL (ref 0.1–1.0)
NEUTROS ABS: 2.7 10*3/uL (ref 1.4–7.7)
Neutrophils Relative %: 57.4 % (ref 43.0–77.0)
PLATELETS: 318 10*3/uL (ref 150.0–400.0)
RBC: 4.01 Mil/uL (ref 3.87–5.11)
RDW: 12.3 % (ref 11.5–15.5)
WBC: 4.8 10*3/uL (ref 4.0–10.5)

## 2015-03-28 LAB — POCT URINALYSIS DIPSTICK
Bilirubin, UA: NEGATIVE
Blood, UA: NEGATIVE
Glucose, UA: NEGATIVE
KETONES UA: NEGATIVE
LEUKOCYTES UA: NEGATIVE
Nitrite, UA: NEGATIVE
PROTEIN UA: NEGATIVE
Spec Grav, UA: <=1.005
UROBILINOGEN UA: 0.2
pH, UA: 6

## 2015-03-28 LAB — URINALYSIS, ROUTINE W REFLEX MICROSCOPIC
BILIRUBIN URINE: NEGATIVE
Hgb urine dipstick: NEGATIVE
Ketones, ur: NEGATIVE
LEUKOCYTES UA: NEGATIVE
Nitrite: NEGATIVE
PH: 6.5 (ref 5.0–8.0)
Total Protein, Urine: NEGATIVE
Urine Glucose: NEGATIVE
Urobilinogen, UA: 0.2 (ref 0.0–1.0)

## 2015-03-28 LAB — BASIC METABOLIC PANEL
BUN: 14 mg/dL (ref 6–23)
CHLORIDE: 102 meq/L (ref 96–112)
CO2: 32 meq/L (ref 19–32)
Calcium: 9.4 mg/dL (ref 8.4–10.5)
Creatinine, Ser: 0.69 mg/dL (ref 0.40–1.20)
GFR: 96.35 mL/min (ref >60.00)
Glucose, Bld: 91 mg/dL (ref 70–99)
POTASSIUM: 3.7 meq/L (ref 3.5–5.1)
SODIUM: 137 meq/L (ref 135–145)

## 2015-03-28 MED ORDER — KETOROLAC TROMETHAMINE 10 MG PO TABS: 10.0000 mg | ORAL_TABLET | Freq: Four times a day (QID) | ORAL | Status: DC | PRN

## 2015-03-28 MED ORDER — CYCLOBENZAPRINE HCL 10 MG PO TABS: 10.0000 mg | ORAL_TABLET | Freq: Three times a day (TID) | ORAL | Status: DC | PRN

## 2015-03-28 MED ORDER — PREDNISONE 10 MG PO TABS: ORAL_TABLET | ORAL | Status: DC

## 2015-03-28 NOTE — Progress Notes (Signed)
Subjective:    Patient ID: Karen Case, female    DOB: December 22, 1965, 49 y.o.   MRN: [id number]  DOS:  03/28/2015 Type of visit - description : acute visit Interval history: Symptoms started accutely 03/26/2015 with left flank pain with some radiation anteriorly. no radiation to the buttocks or leg. Pain is steady,somewhat worse when she moves her torso. Went to urgent care yesterday, was told that she had someblood in the urine but was not prescribing antibiotics, they provide a injection for pain.(Toradol?) It only helped modestly. The pain is persisting today.  Review of Systems Denies any fever or chills. Over the last week she had occasional nausea but no vomiting, diarrhea or blood in the stools. Appetite is okay. No dysuria, gross hematuria. occ  has urinary frequency. No rash. Last menstrual period was2 years ago. Husband had a vasectomy, denies any spotting or vaginal discharge.  Past Medical History  Diagnosis Date  . Birth control     husband's vasectomy  . GERD (gastroesophageal reflux disease)   . Anxiety   . Chronic constipation 06/30/2011    Past Surgical History  Procedure Laterality Date  . Bunionectomy  2000    Right  . Skin biopsy      R eye lid     Social History   Social History  . Marital Status: Married    Spouse Name: N/A  . Number of Children: 2  . Years of Education: N/A   Occupational History  . home , student   .      Social History Main Topics  . Smoking status: Never Smoker   . Smokeless tobacco: Never Used  . Alcohol Use: No     Comment: socially   . Drug Use: No  . Sexual Activity: Not on file   Other Topics Concern  . Not on file   Social History Narrative   Original from [place], moved to [place] in 1994    2 daughters (teens)        Medication List       This list is accurate as of: 03/28/15  5:29 PM.  Always use your most recent med list.               cyclobenzaprine 10 MG tablet  Commonly known as:  FLEXERIL    Take 1 tablet (10 mg total) by mouth 3 (three) times daily as needed for muscle spasms.     ketorolac 10 MG tablet  Commonly known as:  TORADOL  Take 1 tablet (10 mg total) by mouth every 6 (six) hours as needed.     predniSONE 10 MG tablet  Commonly known as:  DELTASONE  4 tablets x 2 days, 3 tabs x 2 days, 2 tabs x 2 days, 1 tab x 2 days           Objective:   Physical Exam BP 116/74 mmHg  Pulse 67  Temp(Src) 98 F (36.7 C) (Oral)  Ht 5\' 3"  (1.6 m)  Wt 130 lb 6 oz (59.138 kg)  BMI 23.10 kg/m2  SpO2 98%  LMP 07/15/2012 General:   Well developed, well nourished . NAD.  HEENT:  Normocephalic . Face symmetric, atraumatic Lungs:  CTA B Normal respiratory effort, no intercostal retractions, no accessory muscle use. Heart: RRR,  no murmur.  no pretibial edema bilaterally  Abdomen:  Not distended, soft, lightly tender at the left lower quadrant without mass or rebound. + CVA tenderness on the left. MSK: No TTP  at the thoracic and cervical spine Skin: Not pale. Not jaundice. No rash Neurologic:  alert & oriented X3.  Speech normal, gait appropriate , not antalgic. Posterior normal Psych--  Cognition and judgment appear intact.  Cooperative with normal attention span and concentration.  Behavior appropriate. No anxious or depressed appearing.    Assessment & Plan:   Problem list> GERD Anxiety Chronic constipation  Dx  2012; saw GI, CT abd neg Menopausal: LMP ~ 2014 Birth-control: Husbands vasectomy  A/P Left flank pain. 49 year old female presented with left flank pain for 2 days, yesterday at urgent care reportedly had some blood in the urine, patient denies gross hematuria. She endorses some nausea, is a slightly TTP on the left lower quadrant. DDX: Diverticulitis,MSK pain, UTI, kidney stones, others. UA negative. Plan: BMP, CBC urine culture. CT abdomen( insurance won't okay with contrast)  Addendum: CT negative, MSK pain? Results discussed with the  patient, we agreed on try prednisone, Flexeril, Toradol. Discontinue hydrocodone it didn't help much this morning. Will call if not improving within the next few days, will call anytime if symptoms severe.

## 2015-03-28 NOTE — Progress Notes (Signed)
Pre visit review using our clinic review tool, if applicable. No additional management support is needed unless otherwise documented below in the visit note. 

## 2015-03-28 NOTE — Assessment & Plan Note (Signed)
Left flank pain. 48 year old female presented with left flank pain for 2 days, yesterday at urgent care reportedly had some blood in the urine, patient denies gross hematuria. She endorses some nausea, is a slightly TTP on the left lower quadrant. DDX: Diverticulitis,MSK pain, UTI, kidney stones, others. UA negative. Plan: BMP, CBC urine culture. CT abdomen( insurance won't okay with contrast)  Addendum: CT negative, MSK pain? Results discussed with the patient, we agreed on try prednisone, Flexeril, Toradol. Discontinue hydrocodone it didn't help much this morning. Will call if not improving within the next few days, will call anytime if symptoms severe.

## 2015-03-28 NOTE — Telephone Encounter (Signed)
CT negative.  MD notified.

## 2015-03-28 NOTE — Patient Instructions (Signed)
Get your blood work before you leave   We are doing the CT of the abdomen today  Call anytime if he has severe symptoms, fever, chills or a rash

## 2015-03-29 LAB — URINE CULTURE: Colony Count: 30000

## 2015-04-02 ENCOUNTER — Encounter: Payer: Self-pay | Admitting: Internal Medicine

## 2015-04-02 ENCOUNTER — Ambulatory Visit (INDEPENDENT_AMBULATORY_CARE_PROVIDER_SITE_OTHER): Payer: BLUE CROSS/BLUE SHIELD | Admitting: Internal Medicine

## 2015-04-02 VITALS — BP 116/64 | HR 90 | Temp 98.2°F | Ht 63.0 in | Wt 130.4 lb

## 2015-04-02 DIAGNOSIS — Z23 Encounter for immunization: Secondary | ICD-10-CM | POA: Diagnosis not present

## 2015-04-02 DIAGNOSIS — Z0000 Encounter for general adult medical examination without abnormal findings: Secondary | ICD-10-CM

## 2015-04-02 DIAGNOSIS — Z09 Encounter for follow-up examination after completed treatment for conditions other than malignant neoplasm: Secondary | ICD-10-CM

## 2015-04-02 MED ORDER — LUBIPROSTONE 8 MCG PO CAPS: 8.0000 ug | ORAL_CAPSULE | Freq: Two times a day (BID) | ORAL | Status: DC

## 2015-04-02 NOTE — Progress Notes (Signed)
Subjective:    Patient ID: Karen Case, female    DOB: 1966-02-16, 49 y.o.   MRN: [id number]  DOS:  04/02/2015 Type of visit - description : CPX Interval history: Was seen last week with left flank pain, left lower quadrant pain. The flank pain is better, no problems moving her torso, she still has residual left lower quadrant abdominal discomfort, steady, mild. No fever, chills, chronic constipation at baseline, no blood in the stools. Appetite and fluid tolerance normal     Review of Systems  Constitutional: No fever. No chills. No unexplained wt changes. No unusual sweats  HEENT: No dental problems, no ear discharge, no facial swelling, no voice changes. No eye discharge, no eye  redness , no  intolerance to light   Respiratory: No wheezing , no  difficulty breathing. No cough , no mucus production  Cardiovascular: No CP, no leg swelling , no  Palpitations  GI:  occ  Nausea which is a chronic problem, no vomiting, no diarrhea   No blood in the stools. No dysphagia, no odynophagia    Endocrine: No polyphagia, no polyuria , no polydipsia  GU: No dysuria, gross hematuria, difficulty urinating. No urinary urgency, no frequency.  Musculoskeletal: No joint swellings or unusual aches or pains  Skin: No change in the color of the skin, palor , no  Rash  Allergic, immunologic: No environmental allergies , no  food allergies  Neurological: No dizziness no  syncope. No headaches. No diplopia, no slurred, no slurred speech, no motor deficits, no facial  Numbness  Hematological: No enlarged lymph nodes, no easy bruising , no unusual bleedings  Psychiatry: No suicidal ideas, no hallucinations, no beavior problems, no confusion.  No unusual/severe anxiety, no depression   Past Medical History  Diagnosis Date  . Birth control     husband's vasectomy  . GERD (gastroesophageal reflux disease)   . Anxiety   . Chronic constipation 06/30/2011    Past Surgical History    Procedure Laterality Date  . Bunionectomy  2000    Right  . Skin biopsy      R eye lid    Family History  Problem Relation Age of Onset  . Hypertension Mother     M  . Hyperlipidemia Neg Hx   . Coronary artery disease Neg Hx   . Stroke Neg Hx   . Colon cancer Neg Hx   . Breast cancer Neg Hx     Social History   Social History  . Marital Status: Married    Spouse Name: N/A  . Number of Children: 2  . Years of Education: N/A   Occupational History  . home , student   .      Social History Main Topics  . Smoking status: Never Smoker   . Smokeless tobacco: Never Used  . Alcohol Use: 0.0 oz/week    0 Standard drinks or equivalent per week     Comment: socially   . Drug Use: No  . Sexual Activity: Not on file   Other Topics Concern  . Not on file   Social History Narrative   Original from [place], moved to [place] in 1994    2 daughters (teens)        Medication List       This list is accurate as of: 04/02/15 11:59 PM.  Always use your most recent med list.               cyclobenzaprine 10  MG tablet  Commonly known as:  FLEXERIL  Take 1 tablet (10 mg total) by mouth 3 (three) times daily as needed for muscle spasms.     ketorolac 10 MG tablet  Commonly known as:  TORADOL  Take 1 tablet (10 mg total) by mouth every 6 (six) hours as needed.     lubiprostone 8 MCG capsule  Commonly known as:  AMITIZA  Take 1 capsule (8 mcg total) by mouth 2 (two) times daily with a meal.     predniSONE 10 MG tablet  Commonly known as:  DELTASONE  4 tablets x 2 days, 3 tabs x 2 days, 2 tabs x 2 days, 1 tab x 2 days           Objective:   Physical Exam BP 116/64 mmHg  Pulse 90  Temp(Src) 98.2 F (36.8 C) (Oral)  Ht 5\' 3"  (1.6 m)  Wt 130 lb 6 oz (59.138 kg)  BMI 23.10 kg/m2  SpO2 99%  LMP 07/15/2012 General:   Well developed, well nourished . NAD.  HEENT:  Normocephalic . Face symmetric, atraumatic Neck: No thyromegaly Lungs:  CTA B Normal respiratory  effort, no intercostal retractions, no accessory muscle use. Heart: RRR,  no murmur.  no pretibial edema bilaterally  Abdomen:  Not distended, soft, slightly tender at the left lower quadrant, no mass, rebound. Back: No TTP, no CVA tenderness Skin: Not pale. Not jaundice Neurologic:  alert & oriented X3.  Speech normal, gait appropriate for age and unassisted Psych--  Cognition and judgment appear intact.  Cooperative with normal attention span and concentration.  Behavior appropriate. No anxious or depressed appearing.    Assessment & Plan:   Assessment> GERD Anxiety Chronic constipation  Dx  2012; saw GI 2014, CT abd neg; linzess >>diarrhea, trial w/ amitiza (03-2015) Menopausal: LMP ~ 2014 Birth-control: Husbands vasectomy  Plan: Left flank pain: Flank pain resolved, has residual left lower quadrant discomfort. Urine culture, CT were negative. White count was normal. Recommend observation for now, this may have been after all MSK pain. Chronic constipation: Since 2012, GI rx a CT in 2014 and prescribed Linzess ---> caused diarrhea Plan: Continue with MiraLAX daily, trial with Amitiza (once the LLQ pain is gone) Next visit in 3 months.

## 2015-04-02 NOTE — Patient Instructions (Addendum)
Please schedule labs to be done within few days (fasting)  Continue with MiraLAX  Try AMITIZA  1 tablet twice a day, okay to take only one tablet a day if you feel is  is too much. Start a trial after the pain at the left abdomen is resolved.     Next visit  for a  routine checkup, in 3 months, nonfasting  (15 minutes) Please schedule an appointment at the front desk

## 2015-04-02 NOTE — Assessment & Plan Note (Signed)
Td 2012, flu shot today Never had a cscope Female care per gyn  Diet-exercise discussed  Labs : LFTs, FLP, TSH

## 2015-04-02 NOTE — Progress Notes (Signed)
Pre visit review using our clinic review tool, if applicable. No additional management support is needed unless otherwise documented below in the visit note. 

## 2015-04-03 NOTE — Assessment & Plan Note (Signed)
Left flank pain: Flank pain resolved, has residual left lower quadrant discomfort. Urine culture, CT were negative. White count was normal. Recommend observation for now, this may have been after all MSK pain. Chronic constipation: Since 2012, GI rx a CT in 2014 and prescribed Linzess ---> caused diarrhea Plan: Continue with MiraLAX daily, trial with Amitiza (once the LLQ pain is gone) Next visit in 3 months.

## 2015-04-04 ENCOUNTER — Other Ambulatory Visit (INDEPENDENT_AMBULATORY_CARE_PROVIDER_SITE_OTHER): Payer: BLUE CROSS/BLUE SHIELD

## 2015-04-04 DIAGNOSIS — Z0000 Encounter for general adult medical examination without abnormal findings: Secondary | ICD-10-CM | POA: Diagnosis not present

## 2015-04-04 LAB — LIPID PANEL
CHOL/HDL RATIO: 3
Cholesterol: 179 mg/dL (ref 0–200)
HDL: 51.4 mg/dL (ref >39.00)
LDL Cholesterol: 101 mg/dL — ABNORMAL HIGH (ref 0–99)
NONHDL: 127.47
Triglycerides: 134 mg/dL (ref 0.0–149.0)
VLDL: 26.8 mg/dL (ref 0.0–40.0)

## 2015-04-04 LAB — AST: AST: 14 U/L (ref 0–37)

## 2015-04-04 LAB — TSH: TSH: 2.14 u[IU]/mL (ref 0.35–4.50)

## 2015-04-04 LAB — ALT: ALT: 14 U/L (ref 0–35)

## 2015-07-02 ENCOUNTER — Ambulatory Visit: Payer: BLUE CROSS/BLUE SHIELD | Admitting: Internal Medicine

## 2015-09-29 ENCOUNTER — Other Ambulatory Visit: Payer: Self-pay

## 2015-09-29 DIAGNOSIS — Z1231 Encounter for screening mammogram for malignant neoplasm of breast: Secondary | ICD-10-CM

## 2015-10-10 ENCOUNTER — Ambulatory Visit
Admission: RE | Admit: 2015-10-10 | Discharge: 2015-10-10 | Disposition: A | Discharge status: Home / Self Care | Payer: BLUE CROSS/BLUE SHIELD | Source: Ambulatory Visit

## 2015-10-10 DIAGNOSIS — Z1231 Encounter for screening mammogram for malignant neoplasm of breast: Secondary | ICD-10-CM

## 2016-05-19 ENCOUNTER — Other Ambulatory Visit: Payer: Self-pay | Admitting: Obstetrics & Gynecology

## 2016-05-19 DIAGNOSIS — N63 Unspecified lump in unspecified breast: Secondary | ICD-10-CM

## 2016-05-25 ENCOUNTER — Ambulatory Visit
Admission: RE | Admit: 2016-05-25 | Discharge: 2016-05-25 | Disposition: A | Discharge status: Home / Self Care | Payer: BLUE CROSS/BLUE SHIELD | Source: Ambulatory Visit | Attending: Obstetrics & Gynecology | Admitting: Obstetrics & Gynecology

## 2016-05-25 ENCOUNTER — Other Ambulatory Visit: Payer: Self-pay | Admitting: Obstetrics & Gynecology

## 2016-05-25 DIAGNOSIS — N63 Unspecified lump in unspecified breast: Secondary | ICD-10-CM

## 2016-06-22 ENCOUNTER — Ambulatory Visit
Admission: RE | Admit: 2016-06-22 | Discharge: 2016-06-22 | Disposition: A | Discharge status: Home / Self Care | Payer: BLUE CROSS/BLUE SHIELD | Source: Ambulatory Visit | Attending: Obstetrics & Gynecology | Admitting: Obstetrics & Gynecology

## 2016-06-22 DIAGNOSIS — N63 Unspecified lump in unspecified breast: Secondary | ICD-10-CM

## 2017-07-21 ENCOUNTER — Encounter: Payer: Self-pay | Admitting: Internal Medicine

## 2017-07-21 ENCOUNTER — Ambulatory Visit (INDEPENDENT_AMBULATORY_CARE_PROVIDER_SITE_OTHER): Payer: BLUE CROSS/BLUE SHIELD | Admitting: Internal Medicine

## 2017-07-21 VITALS — BP 116/70 | HR 76 | Temp 97.8°F | Resp 14 | Ht 63.0 in | Wt 136.0 lb

## 2017-07-21 DIAGNOSIS — Z114 Encounter for screening for human immunodeficiency virus [HIV]: Secondary | ICD-10-CM

## 2017-07-21 DIAGNOSIS — Z1211 Encounter for screening for malignant neoplasm of colon: Secondary | ICD-10-CM

## 2017-07-21 DIAGNOSIS — R399 Unspecified symptoms and signs involving the genitourinary system: Secondary | ICD-10-CM | POA: Diagnosis not present

## 2017-07-21 DIAGNOSIS — Z0000 Encounter for general adult medical examination without abnormal findings: Secondary | ICD-10-CM | POA: Diagnosis not present

## 2017-07-21 LAB — LIPID PANEL
CHOL/HDL RATIO: 3
Cholesterol: 183 mg/dL (ref 0–200)
HDL: 57.1 mg/dL (ref >39.00)
LDL Cholesterol: 106 mg/dL — ABNORMAL HIGH (ref 0–99)
NONHDL: 125.82
TRIGLYCERIDES: 98 mg/dL (ref 0.0–149.0)
VLDL: 19.6 mg/dL (ref 0.0–40.0)

## 2017-07-21 LAB — URINALYSIS, ROUTINE W REFLEX MICROSCOPIC
Bilirubin Urine: NEGATIVE
KETONES UR: NEGATIVE
LEUKOCYTES UA: NEGATIVE
Nitrite: NEGATIVE
PH: 5.5 (ref 5.0–8.0)
Total Protein, Urine: NEGATIVE
Urine Glucose: NEGATIVE
Urobilinogen, UA: 0.2 (ref 0.0–1.0)

## 2017-07-21 LAB — CBC WITH DIFFERENTIAL/PLATELET
Basophils Absolute: 0 10*3/uL (ref 0.0–0.1)
Basophils Relative: 0.5 % (ref 0.0–3.0)
EOS PCT: 2.6 % (ref 0.0–5.0)
Eosinophils Absolute: 0.1 10*3/uL (ref 0.0–0.7)
HCT: 40.4 % (ref 36.0–46.0)
Hemoglobin: 13.4 g/dL (ref 12.0–15.0)
LYMPHS ABS: 1.4 10*3/uL (ref 0.7–4.0)
Lymphocytes Relative: 30.7 % (ref 12.0–46.0)
MCHC: 33.1 g/dL (ref 30.0–36.0)
MCV: 98 fl (ref 78.0–100.0)
MONO ABS: 0.4 10*3/uL (ref 0.1–1.0)
Monocytes Relative: 9.5 % (ref 3.0–12.0)
NEUTROS ABS: 2.5 10*3/uL (ref 1.4–7.7)
NEUTROS PCT: 56.7 % (ref 43.0–77.0)
PLATELETS: 330 10*3/uL (ref 150.0–400.0)
RBC: 4.13 Mil/uL (ref 3.87–5.11)
RDW: 12.9 % (ref 11.5–15.5)
WBC: 4.5 10*3/uL (ref 4.0–10.5)

## 2017-07-21 LAB — COMPREHENSIVE METABOLIC PANEL
ALK PHOS: 77 U/L (ref 39–117)
ALT: 24 U/L (ref 0–35)
AST: 18 U/L (ref 0–37)
Albumin: 4.4 g/dL (ref 3.5–5.2)
BILIRUBIN TOTAL: 0.8 mg/dL (ref 0.2–1.2)
BUN: 12 mg/dL (ref 6–23)
CO2: 30 mEq/L (ref 19–32)
Calcium: 9.4 mg/dL (ref 8.4–10.5)
Chloride: 104 mEq/L (ref 96–112)
Creatinine, Ser: 0.67 mg/dL (ref 0.40–1.20)
GFR: 98.73 mL/min (ref >60.00)
Glucose, Bld: 101 mg/dL — ABNORMAL HIGH (ref 70–99)
Potassium: 3.4 mEq/L — ABNORMAL LOW (ref 3.5–5.1)
SODIUM: 142 meq/L (ref 135–145)
TOTAL PROTEIN: 7.3 g/dL (ref 6.0–8.3)

## 2017-07-21 LAB — TSH: TSH: 2.11 u[IU]/mL (ref 0.35–4.50)

## 2017-07-21 NOTE — Progress Notes (Signed)
Pre visit review using our clinic review tool, if applicable. No additional management support is needed unless otherwise documented below in the visit note. 

## 2017-07-21 NOTE — Assessment & Plan Note (Signed)
Index finger pain: Overuse?  She works with her hands a lot.  Observation for now, call if severe. Varicose veins: Recommend to use compression stocking daily when she goes to work. RTC 1 year

## 2017-07-21 NOTE — Progress Notes (Signed)
Subjective:    Patient ID: Karen Case, female    DOB: 03/26/1966, 52 y.o.   MRN: [id number]  DOS:  07/21/2017 Type of visit - description : cpx Interval history: In general feeling well, does have some symptoms   Review of Systems Occasional pain at the PIP in both indexes (fingers), no redness or swelling. Started a new position at work, stands most of the time, her varicose veins has been more noticeable and occasionally feels some heaviness in the legs, worse on the right. Occasionally has some urinary urgency.  No gross hematuria, minimal dysuria sometimes.  Denies vaginal dryness or discharge.  No history of UTIs.  Other than above, a 14 point review of systems is negative    Past Medical History:  Diagnosis Date  . Birth control    husband's vasectomy  . Chronic constipation 06/30/2011  . GERD (gastroesophageal reflux disease)     Past Surgical History:  Procedure Laterality Date  . BUNIONECTOMY  2000   Right  . SKIN BIOPSY     R eye lid     Social History   Socioeconomic History  . Marital status: Married    Spouse name: Not on file  . Number of children: 2  . Years of education: Not on file  . Highest education level: Not on file  Social Needs  . Financial resource strain: Not on file  . Food insecurity - worry: Not on file  . Food insecurity - inability: Not on file  . Transportation needs - medical: Not on file  . Transportation needs - non-medical: Not on file  Occupational History  . Occupation:  [employer]  Tobacco Use  . Smoking status: Never Smoker  . Smokeless tobacco: Never Used  Substance and Sexual Activity  . Alcohol use: Yes    Alcohol/week: 0.0 oz    Comment: socially   . Drug use: No  . Sexual activity: Not on file  Other Topics Concern  . Not on file  Social History Narrative   Original from [place], moved to [place] in 1994    2 daughters   [place]       Family History  Problem Relation Age of Onset  .  Hypertension Mother        M  . Hyperlipidemia Neg Hx   . Coronary artery disease Neg Hx   . Stroke Neg Hx   . Colon cancer Neg Hx   . Breast cancer Neg Hx      Allergies as of 07/21/2017   No Known Allergies     Medication List    as of 07/21/2017  4:05 PM   You have not been prescribed any medications.        Objective:   Physical Exam BP 116/70 (BP Location: Left Arm, Patient Position: Sitting, Cuff Size: Small)   Pulse 76   Temp 97.8 F (36.6 C) (Oral)   Resp 14   Ht 5\' 3"  (1.6 m)   Wt 136 lb (61.7 kg)   LMP 07/15/2012   SpO2 99%   BMI 24.09 kg/m  General:   Well developed, well nourished . NAD.  Neck: No  thyromegaly  HEENT:  Normocephalic . Face symmetric, atraumatic Lungs:  CTA B Normal respiratory effort, no intercostal retractions, no accessory muscle use. Heart: RRR,  no murmur.  Lower extremities: No edema, skin normal, has few bilateral varicose veins without redness or tenderness. Abdomen:  Not distended, soft, non-tender.  No rebound or rigidity.   Skin: Exposed areas without rash. Not pale. Not jaundice MSK: Hands and wrists without synovitis Neurologic:  alert & oriented X3.  Speech normal, gait appropriate for age and unassisted Strength symmetric and appropriate for age.  Psych: Cognition and judgment appear intact.  Cooperative with normal attention span and concentration.  Behavior appropriate. No anxious or depressed appearing.     Assessment & Plan:    Assessment Constipation, GERD Menopausal age ~37  Plan: Index finger pain: Overuse?  She works with her hands a lot.  Observation for now, call if severe. Varicose veins: Recommend to use compression stocking daily when she goes to work. RTC 1 year

## 2017-07-21 NOTE — Assessment & Plan Note (Signed)
-  Td 2012, flu shot declined d/t s/e  -CCS: Never had a cscope, 3 modalities d/w pt, elected Cscope, GI referral -Female care per gyn next OV few months , [facility] 05-2016.  Will consider a DEXA in the next few years -Diet-exercise discussed.  Recommend vitamin D daily, avoid calcium due to history of constipation. -Labs : CMP, FLP, CBC, TSH, HIV, UA urine culture (has few urinary symptoms, see ROS)

## 2017-07-21 NOTE — Patient Instructions (Addendum)
GO TO THE LAB : Get the blood work     GO TO THE FRONT DESK Schedule your next appointment for a  Physical exam in 1 year  Use compression stockings at work

## 2017-07-22 LAB — URINE CULTURE
MICRO NUMBER: 90040378
SPECIMEN QUALITY: ADEQUATE

## 2017-07-22 LAB — HIV ANTIBODY (ROUTINE TESTING W REFLEX): HIV: NONREACTIVE

## 2017-09-05 ENCOUNTER — Other Ambulatory Visit: Payer: Self-pay

## 2017-09-05 ENCOUNTER — Ambulatory Visit (AMBULATORY_SURGERY_CENTER): Payer: Self-pay

## 2017-09-05 VITALS — Ht 62.0 in | Wt 136.4 lb

## 2017-09-05 DIAGNOSIS — Z1211 Encounter for screening for malignant neoplasm of colon: Secondary | ICD-10-CM

## 2017-09-05 MED ORDER — NA SULFATE-K SULFATE-MG SULF 17.5-3.13-1.6 GM/177ML PO SOLN: 1.0000 | Freq: Once | ORAL | 0 refills | Status: AC

## 2017-09-05 NOTE — Progress Notes (Signed)
No egg or soy allergy known to patient  No past sedation  No diet pills per patient No home 02 use per patient  No blood thinners per patient  Pt has history of chronic constipation  No A fib or A flutter  EMMI video sent to pt's e mail

## 2017-09-19 ENCOUNTER — Encounter: Payer: BLUE CROSS/BLUE SHIELD | Admitting: Internal Medicine

## 2017-09-21 ENCOUNTER — Encounter: Payer: Self-pay | Admitting: Gastroenterology

## 2017-09-23 ENCOUNTER — Encounter: Payer: Self-pay | Admitting: Internal Medicine

## 2017-09-23 ENCOUNTER — Ambulatory Visit: Payer: BLUE CROSS/BLUE SHIELD | Admitting: Internal Medicine

## 2017-09-23 VITALS — BP 118/68 | HR 72 | Temp 97.8°F | Resp 14 | Ht 63.0 in | Wt 134.0 lb

## 2017-09-23 DIAGNOSIS — M25562 Pain in left knee: Secondary | ICD-10-CM

## 2017-09-23 NOTE — Patient Instructions (Signed)
ICE  Knee sleeve  IBUPROFEN (Advil or Motrin) 200 mg 2 tablets every 6 hours as needed for pain.  Always take it with food because may cause gastritis and ulcers.  If you notice nausea, stomach pain, change in the color of stools --->  Stop the medicine and let us know

## 2017-09-23 NOTE — Progress Notes (Signed)
Subjective:    Patient ID: Karen Case, female    DOB: [birth year], 52 y.o.   MRN: [id number]  DOS:  09/23/2017 Type of visit - description : acute Interval history:  Left knee pain for 3 weeks: Most of the pain is anteriorly, worse with certain movements, is even painful at night depending on her position. She went to [place] last week and was able to walk for hours but did experience pain.  Review of Systems Denies any injury. No calf swelling or pain No previous injury  Past Medical History:  Diagnosis Date  . Birth control    husband's vasectomy  . Chronic constipation 06/30/2011  . GERD (gastroesophageal reflux disease)     Past Surgical History:  Procedure Laterality Date  . BUNIONECTOMY  2000   Right  . SKIN BIOPSY     R eye lid     Social History   Socioeconomic History  . Marital status: Married    Spouse name: Not on file  . Number of children: 2  . Years of education: Not on file  . Highest education level: Not on file  Social Needs  . Financial resource strain: Not on file  . Food insecurity - worry: Not on file  . Food insecurity - inability: Not on file  . Transportation needs - medical: Not on file  . Transportation needs - non-medical: Not on file  Occupational History  . Occupation:  [employer]  Tobacco Use  . Smoking status: Never Smoker  . Smokeless tobacco: Never Used  Substance and Sexual Activity  . Alcohol use: Yes    Alcohol/week: 0.0 oz    Comment: socially   . Drug use: No  . Sexual activity: Not on file  Other Topics Concern  . Not on file  Social History Narrative   Original from [place], moved to [place] in 1994    2 daughters   [place]        Allergies as of 09/23/2017   No Known Allergies     Medication List        Accurate as of 09/23/17 11:48 AM. Always use your most recent med list.          DULCOLAX 5 MG EC tablet Generic drug:  bisacodyl Take 5 mg by mouth once. Colon x 4   MIRALAX  powder Generic drug:  polyethylene glycol powder Take 1 Container by mouth once.          Objective:   Physical Exam BP 118/68 (BP Location: Left Arm, Patient Position: Sitting, Cuff Size: Small)   Pulse 72   Temp 97.8 F (36.6 C) (Oral)   Resp 14   Ht 5\' 3"  (1.6 m)   Wt 134 lb (60.8 kg)   LMP 07/15/2012   SpO2 97%   BMI 23.74 kg/m  General:   Well developed, well nourished . NAD.  HEENT:  Normocephalic . Face symmetric, atraumatic MSK: Right knee normal Left knee: No effusion, deformity, range of motion is normal (+ pain with hyperflexion).  Twisting the knee with pressure cause some pain. Calves symmetric. Skin: Not pale. Not jaundice Neurologic:  alert & oriented X3.  Speech normal, gait appropriate for age and unassisted Psych--  Cognition and judgment appear intact.  Cooperative with normal attention span and concentration.  Behavior appropriate. No anxious or depressed appearing.      Assessment & Plan:   Assessment Constipation, GERD Menopausal age ~53  Plan: Left  knee pain: tendinitis vs  internal derangement, meniscal tear?   Rx to see sports medicine.  In the meantime will do ice, knee sleeve, Motrin with GI precautions.  Patient in agreement.

## 2017-09-23 NOTE — Progress Notes (Signed)
Pre visit review using our clinic review tool, if applicable. No additional management support is needed unless otherwise documented below in the visit note. 

## 2017-09-25 NOTE — Assessment & Plan Note (Signed)
Left knee pain: tendinitis vs  internal derangement, meniscal tear?   Rx to see sports medicine.  In the meantime will do ice, knee sleeve, Motrin with GI precautions.  Patient in agreement.

## 2017-09-29 ENCOUNTER — Encounter: Payer: Self-pay | Admitting: Gastroenterology

## 2017-09-29 ENCOUNTER — Ambulatory Visit (AMBULATORY_SURGERY_CENTER): Payer: BLUE CROSS/BLUE SHIELD | Admitting: Gastroenterology

## 2017-09-29 ENCOUNTER — Other Ambulatory Visit: Payer: Self-pay

## 2017-09-29 VITALS — BP 95/62 | HR 66 | Temp 98.4°F | Resp 13 | Ht 62.0 in | Wt 136.0 lb

## 2017-09-29 DIAGNOSIS — Z1211 Encounter for screening for malignant neoplasm of colon: Secondary | ICD-10-CM | POA: Diagnosis present

## 2017-09-29 DIAGNOSIS — D120 Benign neoplasm of cecum: Secondary | ICD-10-CM

## 2017-09-29 DIAGNOSIS — K635 Polyp of colon: Secondary | ICD-10-CM

## 2017-09-29 DIAGNOSIS — D123 Benign neoplasm of transverse colon: Secondary | ICD-10-CM | POA: Diagnosis not present

## 2017-09-29 DIAGNOSIS — D126 Benign neoplasm of colon, unspecified: Secondary | ICD-10-CM | POA: Diagnosis not present

## 2017-09-29 MED ORDER — SODIUM CHLORIDE 0.9 % IV SOLN: 500.0000 mL | Freq: Once | INTRAVENOUS | Status: DC

## 2017-09-29 NOTE — Progress Notes (Signed)
Pt's states no medical or surgical changes since previsit or office visit. 

## 2017-09-29 NOTE — Progress Notes (Signed)
A and O x3. Report to RN. Tolerated MAC anesthesia well.

## 2017-09-29 NOTE — Patient Instructions (Signed)
**  Handouts given on polyps, Diverticulosis and High fiber diet**   YOU HAD AN ENDOSCOPIC PROCEDURE TODAY: Refer to the procedure report and other information in the discharge instructions given to you for any specific questions about what was found during the examination. If this information does not answer your questions, please call [place] office at [phone] to clarify.   YOU SHOULD EXPECT: Some feelings of bloating in the abdomen. Passage of more gas than usual. Walking can help get rid of the air that was put into your GI tract during the procedure and reduce the bloating. If you had a lower endoscopy (such as a colonoscopy or flexible sigmoidoscopy) you may notice spotting of blood in your stool or on the toilet paper. Some abdominal soreness may be present for a day or two, also.  DIET: Your first meal following the procedure should be a light meal and then it is ok to progress to your normal diet. A half-sandwich or bowl of soup is an example of a good first meal. Heavy or fried foods are harder to digest and may make you feel nauseous or bloated. Drink plenty of fluids but you should avoid alcoholic beverages for 24 hours. If you had a esophageal dilation, please see attached instructions for diet.    ACTIVITY: Your care partner should take you home directly after the procedure. You should plan to take it easy, moving slowly for the rest of the day. You can resume normal activity the day after the procedure however YOU SHOULD NOT DRIVE, use power tools, machinery or perform tasks that involve climbing or major physical exertion for 24 hours (because of the sedation medicines used during the test).   SYMPTOMS TO REPORT IMMEDIATELY: A gastroenterologist can be reached at any hour. Please call [phone]  for any of the following symptoms:  Following lower endoscopy (colonoscopy, flexible sigmoidoscopy) Excessive amounts of blood in the stool  Significant tenderness, worsening of abdominal  pains  Swelling of the abdomen that is new, acute  Fever of 100 or higher    FOLLOW UP:  If any biopsies were taken you will be contacted by phone or by letter within the next 1-3 weeks. Call [phone]  if you have not heard about the biopsies in 3 weeks.  Please also call with any specific questions about appointments or follow up tests.

## 2017-09-29 NOTE — Progress Notes (Signed)
Called to room to assist during endoscopic procedure.  Patient ID and intended procedure confirmed with present staff. Received instructions for my participation in the procedure from the performing physician.  

## 2017-09-29 NOTE — Op Note (Signed)
[place] Patient Name: Karen Case Procedure Date: 09/29/2017 11:17 AM MRN: [id number] Endoscopist: Ladene Artist , MD Age: 52 Referring MD:  Date of Birth: Nov 16, 1965 Gender: Female Account #: [id number] Procedure:                Colonoscopy Indications:              Screening for colorectal malignant neoplasm Medicines:                Monitored Anesthesia Care Procedure:                Pre-Anesthesia Assessment:                           - Prior to the procedure, a History and Physical                            was performed, and patient medications and                            allergies were reviewed. The patient's tolerance of                            previous anesthesia was also reviewed. The risks                            and benefits of the procedure and the sedation                            options and risks were discussed with the patient.                            All questions were answered, and informed consent                            was obtained. Prior Anticoagulants: The patient has                            taken no previous anticoagulant or antiplatelet                            agents. ASA Grade Assessment: II - A patient with                            mild systemic disease. After reviewing the risks                            and benefits, the patient was deemed in                            satisfactory condition to undergo the procedure.                           After obtaining informed consent, the colonoscope  was passed under direct vision. Throughout the                            procedure, the patient's blood pressure, pulse, and                            oxygen saturations were monitored continuously. The                            Model PCF-[id number]) scope was introduced                            through the anus and advanced to the the cecum,                            identified by  appendiceal orifice and ileocecal                            valve. The ileocecal valve, appendiceal orifice,                            and rectum were photographed. The quality of the                            bowel preparation was excellent. The colonoscopy                            was performed without difficulty. The patient                            tolerated the procedure well. Scope In: 11:24:02 AM Scope Out: 11:40:50 AM Scope Withdrawal Time: 0 hours 14 minutes 7 seconds  Total Procedure Duration: 0 hours 16 minutes 48 seconds  Findings:                 The perianal and digital rectal examinations were                            normal.                           Four sessile polyps were found in the transverse                            colon (1), hepatic flexure (2) and cecum (1). The                            polyps were 5 to 7 mm in size. These polyps were                            removed with a cold snare. Resection and retrieval                            were complete.  A few small-mouthed diverticula were found in the                            left colon.                           The exam was otherwise without abnormality on                            direct and retroflexion views. Complications:            No immediate complications. Estimated blood loss:                            None. Estimated Blood Loss:     Estimated blood loss: none. Impression:               - Four 5 to 7 mm polyps in the transverse colon, at                            the hepatic flexure and in the cecum, removed with                            a cold snare. Resected and retrieved.                           - Diverticulosis in the left colon.                           - The examination was otherwise normal on direct                            and retroflexion views. Recommendation:           - Repeat colonoscopy in 3 - 5 years for                             surveillance pending pathology review.                           - Patient has a contact number available for                            emergencies. The signs and symptoms of potential                            delayed complications were discussed with the                            patient. Return to normal activities tomorrow.                            Written discharge instructions were provided to the                            patient.                           -  High fiber diet.                           - Continue present medications.                           - Await pathology results. Ladene Artist, MD 09/29/2017 11:44:11 AM This report has been signed electronically.

## 2017-09-30 ENCOUNTER — Telehealth: Payer: Self-pay

## 2017-09-30 ENCOUNTER — Telehealth: Payer: Self-pay | Admitting: *Deleted

## 2017-09-30 ENCOUNTER — Ambulatory Visit (INDEPENDENT_AMBULATORY_CARE_PROVIDER_SITE_OTHER): Payer: BLUE CROSS/BLUE SHIELD | Admitting: Family Medicine

## 2017-09-30 ENCOUNTER — Encounter: Payer: Self-pay | Admitting: Family Medicine

## 2017-09-30 DIAGNOSIS — M25562 Pain in left knee: Secondary | ICD-10-CM

## 2017-09-30 MED ORDER — METHYLPREDNISOLONE ACETATE 40 MG/ML IJ SUSP
40.0000 mg | Freq: Once | INTRAMUSCULAR | Status: AC
  Administered 2017-09-30: 40 mg via INTRA_ARTICULAR

## 2017-09-30 NOTE — Patient Instructions (Signed)
Your pain is due to arthritis. These are the different medications you can take for this: Tylenol 500mg 1-2 tabs three times a day for pain. Capsaicin, aspercreme, or biofreeze topically up to four times a day may also help with pain. Some supplements that may help for arthritis: Boswellia extract, curcumin, pycnogenol Aleve 1-2 tabs twice a day with food Cortisone injections are an option - you were given this today If cortisone injections do not help, there are different types of shots that may help but they take longer to take effect. It's important that you continue to stay active. Straight leg raises, knee extensions 3 sets of 10 once a day (add ankle weight if these become too easy). Consider physical therapy to strengthen muscles around the joint that hurts to take pressure off of the joint itself. Shoe inserts with good arch support may be helpful. Heat or ice 15 minutes at a time 3-4 times a day as needed to help with pain. Water aerobics and cycling with low resistance are the best two types of exercise for arthritis though any exercise is ok as long as it doesn't worsen the pain. Follow up with me in 1 month.  

## 2017-09-30 NOTE — Telephone Encounter (Signed)
  Follow up Call-  Call back number 09/29/2017  Post procedure Call Back phone  # [phone] cell  Permission to leave phone message Yes  Some recent data might be hidden     Patient questions:  Do you have a fever, pain , or abdominal swelling? No. Pain Score  0 *  Have you tolerated food without any problems? Yes.    Have you been able to return to your normal activities? Yes.    Do you have any questions about your discharge instructions: Diet   No. Medications  No. Follow up visit  No.  Do you have questions or concerns about your Care? No.  Actions: * If pain score is 4 or above: No action needed, pain <4.

## 2017-09-30 NOTE — Telephone Encounter (Signed)
Attempted to reach pt. With follow up call following endoscopic procedure 09/29/17.  LM on pt. Ans. Machine.  Will try to reach pt. Again later today.

## 2017-10-02 ENCOUNTER — Encounter: Payer: Self-pay | Admitting: Family Medicine

## 2017-10-02 DIAGNOSIS — M25562 Pain in left knee: Secondary | ICD-10-CM | POA: Insufficient documentation

## 2017-10-02 NOTE — Assessment & Plan Note (Signed)
patient exam is reassuring, consistent with tricompartmental arthritis.  Discussed tylenol, topical medications, aleve, supplements that may help.  Intraarticular cortisone injection given today.  Shown home exercises to do daily.  Heat/ice.  F/u in 1 month.  After informed written consent timeout was performed, patient was seated on exam table. Left knee was prepped with alcohol swab and utilizing anteromedial approach, patient's left knee was injected intraarticularly with 3:1 bupivicaine: depomedrol. Patient tolerated the procedure well without immediate complications.

## 2017-10-02 NOTE — Progress Notes (Signed)
PCP and consultation requested by: Colon Branch, MD  Subjective:   HPI: Patient is a 52 y.o. female here for left knee pain.  Patient reports for about 2 months she's had medial and lateral left knee pain. Pain worsened more past 3 weeks and is sharp. Pain level 10/10. Cannot lie on either side due to pain when knees rub against each other. Taking tylenol and motrin. Can radiate up her leg. No skin changes, numbness.  Past Medical History:  Diagnosis Date  . Birth control    husband's vasectomy  . Chronic constipation 06/30/2011  . GERD (gastroesophageal reflux disease)     Current Outpatient Medications on File Prior to Visit  Medication Sig Dispense Refill  . Aspirin-Acetaminophen-Caffeine (EXCEDRIN PO) Take by mouth.     Current Facility-Administered Medications on File Prior to Visit  Medication Dose Route Frequency Provider Last Rate Last Dose  . 0.9 %  sodium chloride infusion  500 mL Intravenous Once Ladene Artist, MD        Past Surgical History:  Procedure Laterality Date  . BUNIONECTOMY  2000   Right  . SKIN BIOPSY     R eye lid     No Known Allergies  Social History   Socioeconomic History  . Marital status: Married    Spouse name: Not on file  . Number of children: 2  . Years of education: Not on file  . Highest education level: Not on file  Occupational History  . Occupation:  Engineer, production  . Financial resource strain: Not on file  . Food insecurity:    Worry: Not on file    Inability: Not on file  . Transportation needs:    Medical: Not on file    Non-medical: Not on file  Tobacco Use  . Smoking status: Never Smoker  . Smokeless tobacco: Never Used  Substance and Sexual Activity  . Alcohol use: Yes    Alcohol/week: 0.0 oz    Comment: socially   . Drug use: No  . Sexual activity: Not on file  Lifestyle  . Physical activity:    Days per week: Not on file    Minutes per session: Not on file  . Stress: Not on file   Relationships  . Social connections:    Talks on phone: Not on file    Gets together: Not on file    Attends religious service: Not on file    Active member of club or organization: Not on file    Attends meetings of clubs or organizations: Not on file    Relationship status: Not on file  . Intimate partner violence:    Fear of current or ex partner: Not on file    Emotionally abused: Not on file    Physically abused: Not on file    Forced sexual activity: Not on file  Other Topics Concern  . Not on file  Social History Narrative   Original from [place], moved to [place] in 1994    2 daughters   [place]      Family History  Problem Relation Age of Onset  . Hypertension Mother        M  . Hyperlipidemia Neg Hx   . Coronary artery disease Neg Hx   . Stroke Neg Hx   . Colon cancer Neg Hx   . Breast cancer Neg Hx   . Esophageal cancer Neg Hx   . Liver cancer  Neg Hx   . Pancreatic cancer Neg Hx   . Prostate cancer Neg Hx   . Rectal cancer Neg Hx   . Stomach cancer Neg Hx     BP 106/70   Pulse 85   Ht 5' (1.524 m)   Wt 134 lb (60.8 kg)   LMP 07/15/2012   BMI 26.17 kg/m   Review of Systems: See HPI above.     Objective:  Physical Exam:  Gen: NAD, comfortable in exam room  Left knee: No gross deformity, ecchymoses, effusion. TTP medial, lateral joint lines and post patellar facets. FROM with 5/5 strength. Negative ant/post drawers. Negative valgus/varus testing. Negative lachmanns. Negative mcmurrays, apleys, patellar apprehension. NV intact distally.  Right knee: No deformity. FROM with 5/5 strength. No tenderness to palpation. NVI distally.   Assessment & Plan:  1. Left knee pain - patient exam is reassuring, consistent with tricompartmental arthritis.  Discussed tylenol, topical medications, aleve, supplements that may help.  Intraarticular cortisone injection given today.  Shown home exercises to do daily.  Heat/ice.  F/u in 1  month.  After informed written consent timeout was performed, patient was seated on exam table. Left knee was prepped with alcohol swab and utilizing anteromedial approach, patient's left knee was injected intraarticularly with 3:1 bupivicaine: depomedrol. Patient tolerated the procedure well without immediate complications.

## 2017-10-10 ENCOUNTER — Other Ambulatory Visit: Payer: Self-pay | Admitting: Obstetrics & Gynecology

## 2017-10-10 DIAGNOSIS — Z1231 Encounter for screening mammogram for malignant neoplasm of breast: Secondary | ICD-10-CM

## 2017-10-10 DIAGNOSIS — Z01419 Encounter for gynecological examination (general) (routine) without abnormal findings: Secondary | ICD-10-CM | POA: Diagnosis not present

## 2017-10-10 LAB — HM PAP SMEAR

## 2017-10-12 ENCOUNTER — Encounter: Payer: Self-pay | Admitting: Gastroenterology

## 2017-10-19 DIAGNOSIS — M79674 Pain in right toe(s): Secondary | ICD-10-CM | POA: Diagnosis not present

## 2017-10-19 DIAGNOSIS — L03031 Cellulitis of right toe: Secondary | ICD-10-CM | POA: Diagnosis not present

## 2017-10-19 DIAGNOSIS — L602 Onychogryphosis: Secondary | ICD-10-CM | POA: Diagnosis not present

## 2017-10-31 ENCOUNTER — Ambulatory Visit (INDEPENDENT_AMBULATORY_CARE_PROVIDER_SITE_OTHER): Payer: BLUE CROSS/BLUE SHIELD | Admitting: Family Medicine

## 2017-10-31 ENCOUNTER — Encounter: Payer: Self-pay | Admitting: Family Medicine

## 2017-10-31 DIAGNOSIS — M25562 Pain in left knee: Secondary | ICD-10-CM | POA: Diagnosis not present

## 2017-10-31 NOTE — Assessment & Plan Note (Signed)
2/2 flare of tricompartmental DJD.  Much improved following injection.  Tylenol or aleve only if needed.  Encouraged exercise and quad strengthening.  F/u prn.

## 2017-10-31 NOTE — Patient Instructions (Signed)
You're doing great! Continue with straight leg raises, knee extensions, exercising. Call me if you need anything otherwise follow up as needed.

## 2017-10-31 NOTE — Progress Notes (Signed)
PCP and consultation requested by: Colon Branch, MD  Subjective:   HPI: Patient is a 52 y.o. female here for left knee pain.  3/22: Patient reports for about 2 months she's had medial and lateral left knee pain. Pain worsened more past 3 weeks and is sharp. Pain level 10/10. Cannot lie on either side due to pain when knees rub against each other. Taking tylenol and motrin. Can radiate up her leg. No skin changes, numbness.  4/22: Patient reports she's doing better. Pain up to 1-2/10 at worst, a soreness now. Has been walking for exercise and doing well. Not taking any medication for this. No skin changes.  Past Medical History:  Diagnosis Date  . Birth control    husband's vasectomy  . Chronic constipation 06/30/2011  . GERD (gastroesophageal reflux disease)     Current Outpatient Medications on File Prior to Visit  Medication Sig Dispense Refill  . Aspirin-Acetaminophen-Caffeine (EXCEDRIN PO) Take by mouth.     Current Facility-Administered Medications on File Prior to Visit  Medication Dose Route Frequency Provider Last Rate Last Dose  . 0.9 %  sodium chloride infusion  500 mL Intravenous Once Ladene Artist, MD        Past Surgical History:  Procedure Laterality Date  . BUNIONECTOMY  2000   Right  . SKIN BIOPSY     R eye lid     No Known Allergies  Social History   Socioeconomic History  . Marital status: Married    Spouse name: Not on file  . Number of children: 2  . Years of education: Not on file  . Highest education level: Not on file  Occupational History  . Occupation:  Engineer, production  . Financial resource strain: Not on file  . Food insecurity:    Worry: Not on file    Inability: Not on file  . Transportation needs:    Medical: Not on file    Non-medical: Not on file  Tobacco Use  . Smoking status: Never Smoker  . Smokeless tobacco: Never Used  Substance and Sexual Activity  . Alcohol use: Yes    Alcohol/week: 0.0 oz    Comment:  socially   . Drug use: No  . Sexual activity: Not on file  Lifestyle  . Physical activity:    Days per week: Not on file    Minutes per session: Not on file  . Stress: Not on file  Relationships  . Social connections:    Talks on phone: Not on file    Gets together: Not on file    Attends religious service: Not on file    Active member of club or organization: Not on file    Attends meetings of clubs or organizations: Not on file    Relationship status: Not on file  . Intimate partner violence:    Fear of current or ex partner: Not on file    Emotionally abused: Not on file    Physically abused: Not on file    Forced sexual activity: Not on file  Other Topics Concern  . Not on file  Social History Narrative   Original from [place], moved to [place] in 1994    2 daughters   [place]      Family History  Problem Relation Age of Onset  . Hypertension Mother        M  . Hyperlipidemia Neg Hx   . Coronary artery disease  Neg Hx   . Stroke Neg Hx   . Colon cancer Neg Hx   . Breast cancer Neg Hx   . Esophageal cancer Neg Hx   . Liver cancer Neg Hx   . Pancreatic cancer Neg Hx   . Prostate cancer Neg Hx   . Rectal cancer Neg Hx   . Stomach cancer Neg Hx     BP 106/73   Pulse 78   Ht 5\' 2"  (1.575 m)   Wt 138 lb (62.6 kg)   LMP 07/15/2012   BMI 25.24 kg/m   Review of Systems: See HPI above.     Objective:  Physical Exam:  Gen: NAD, comfortable in exam room  Left knee: No gross deformity, ecchymoses, swelling. No TTP. FROM with 5/5 strength. Negative ant/post drawers. Negative valgus/varus testing. Negative lachmanns. Negative mcmurrays, apleys, patellar apprehension. NV intact distally.   Assessment & Plan:  1. Left knee pain - 2/2 flare of tricompartmental DJD.  Much improved following injection.  Tylenol or aleve only if needed.  Encouraged exercise and quad strengthening.  F/u prn.

## 2017-11-01 ENCOUNTER — Ambulatory Visit
Admission: RE | Admit: 2017-11-01 | Discharge: 2017-11-01 | Disposition: A | Discharge status: Home / Self Care | Payer: BLUE CROSS/BLUE SHIELD | Source: Ambulatory Visit | Attending: Obstetrics & Gynecology | Admitting: Obstetrics & Gynecology

## 2017-11-01 DIAGNOSIS — Z1231 Encounter for screening mammogram for malignant neoplasm of breast: Secondary | ICD-10-CM | POA: Diagnosis not present

## 2017-11-01 LAB — HM MAMMOGRAPHY

## 2018-01-13 ENCOUNTER — Ambulatory Visit: Payer: BLUE CROSS/BLUE SHIELD | Admitting: Internal Medicine

## 2018-01-13 ENCOUNTER — Encounter: Payer: Self-pay | Admitting: Internal Medicine

## 2018-01-13 VITALS — BP 126/70 | HR 73 | Temp 98.0°F | Resp 16 | Ht 62.0 in | Wt 139.0 lb

## 2018-01-13 DIAGNOSIS — J069 Acute upper respiratory infection, unspecified: Secondary | ICD-10-CM | POA: Diagnosis not present

## 2018-01-13 DIAGNOSIS — M545 Low back pain: Secondary | ICD-10-CM

## 2018-01-13 DIAGNOSIS — R300 Dysuria: Secondary | ICD-10-CM | POA: Diagnosis not present

## 2018-01-13 MED ORDER — AZELASTINE HCL 0.1 % NA SOLN: 2.0000 | Freq: Every evening | NASAL | 3 refills | Status: DC | PRN

## 2018-01-13 MED ORDER — CYCLOBENZAPRINE HCL 10 MG PO TABS: 10.0000 mg | ORAL_TABLET | Freq: Every evening | ORAL | 0 refills | Status: DC | PRN

## 2018-01-13 NOTE — Progress Notes (Signed)
Pre visit review using our clinic review tool, if applicable. No additional management support is needed unless otherwise documented below in the visit note. 

## 2018-01-13 NOTE — Progress Notes (Signed)
Subjective:    Patient ID: Karen Case, female    DOB: [birth year], 52 y.o.   MRN: [id number]  DOS:  01/13/2018 Type of visit - description : Acute visit, multiple symptoms Interval history: 34-month history of on and off low back pain, sometimes left, other times  right or bilateral. Denies any injury however the last 4 weeks she has been packing and  moving furniture. Symptoms occasionally worse when she urinates.  Also, 3 days history of sinus pain and congestion, mild nasal discharge. Today she developed sore throat and has a mild headache.  Also, found a nail on her right shoe but reports no bleeding or skin breakdown, wonders about her Tdap (last was 2012, no need for a booster at this point)   Review of Systems No fever chills Occasionally has dysuria but no gross hematuria No abdominal pain No sputum production. Occasional radiation of the back pain to the R leg? Occasional paresthesias of the right leg and cramps?  Patient not sure.  Past Medical History:  Diagnosis Date  . Birth control    husband's vasectomy  . Chronic constipation 06/30/2011  . GERD (gastroesophageal reflux disease)     Past Surgical History:  Procedure Laterality Date  . BUNIONECTOMY  2000   Right  . SKIN BIOPSY     R eye lid     Social History   Socioeconomic History  . Marital status: Married    Spouse name: Not on file  . Number of children: 2  . Years of education: Not on file  . Highest education level: Not on file  Occupational History  . Occupation:  Engineer, production  . Financial resource strain: Not on file  . Food insecurity:    Worry: Not on file    Inability: Not on file  . Transportation needs:    Medical: Not on file    Non-medical: Not on file  Tobacco Use  . Smoking status: Never Smoker  . Smokeless tobacco: Never Used  Substance and Sexual Activity  . Alcohol use: Yes    Alcohol/week: 0.0 oz    Comment: socially   . Drug use: No  . Sexual activity:  Not on file  Lifestyle  . Physical activity:    Days per week: Not on file    Minutes per session: Not on file  . Stress: Not on file  Relationships  . Social connections:    Talks on phone: Not on file    Gets together: Not on file    Attends religious service: Not on file    Active member of club or organization: Not on file    Attends meetings of clubs or organizations: Not on file    Relationship status: Not on file  . Intimate partner violence:    Fear of current or ex partner: Not on file    Emotionally abused: Not on file    Physically abused: Not on file    Forced sexual activity: Not on file  Other Topics Concern  . Not on file  Social History Narrative   Original from [place], moved to [place] in 1994    2 daughters   [place]        Allergies as of 01/13/2018   No Known Allergies     Medication List        Accurate as of 01/13/18  2:17 PM. Always use your most recent med list.  EXCEDRIN PO Take by mouth.          Objective:   Physical Exam BP 126/70 (BP Location: Left Arm, Patient Position: Sitting, Cuff Size: Small)   Pulse 73   Temp 98 F (36.7 C) (Oral)   Resp 16   Ht 5\' 2"  (1.575 m)   Wt 139 lb (63 kg)   LMP 07/15/2012   SpO2 96%   BMI 25.42 kg/m  General:   Well developed, NAD, see BMI.  HEENT:  Normocephalic . Face symmetric, atraumatic. TMs normal, throat symmetric, no red.  EOMI. Neck: Full range of motion Lungs:  CTA B Normal respiratory effort, no intercostal retractions, no accessory muscle use. Heart: RRR,  no murmur.  No pretibial edema bilaterally  Skin: Not pale. Not jaundice MSK: No TTP of the lumbar spine or SI joints Neurologic:  alert & oriented X3.  Speech normal, gait appropriate for age and unassisted. DTRs and motor symmetric.  Straight leg test: question of + on the R  side Psych--  Cognition and judgment appear intact.  Cooperative with normal attention span and concentration.    Behavior appropriate. No anxious or depressed appearing.      Assessment & Plan:    Assessment Constipation, GERD Menopausal age ~68  Plan: Acute visit, here with multiple concerns: Low back pain: Neurological exam is benign, question of mild right-sided radiculopathy?. Plan: Stretching, NSAIDs, GI precautions discussed.  Also Flexeril at night.  Drowsiness discussed.  Will call if not improving in few days for further eval. Dysuria: Sporadic symptom, check a UA urine culture. URI?:  Symptoms started to 3 days ago with sinus pain congestion and a mild headache.  Exam is benign, will treat conservatively with Flonase, Astelin.  Call if not better.

## 2018-01-13 NOTE — Patient Instructions (Signed)
GO TO THE LAB : provide a urine sample  For back pain: --Stretching --IBUPROFEN (Advil or Motrin) 200 mg 2 tablets every 8 hours as needed for pain.  Always take it with food because may cause gastritis and ulcers.  If you notice nausea, stomach pain, change in the color of stools --->  Stop the medicine and let us know --Flexeril, a muscle relaxant at night.  Will cause drowsiness   For sinus congestion: If cough:  Take Mucinex DM twice a day as needed until better  For nasal congestion: Use OTC Nasocort or Flonase : 2 nasal sprays on each side of the nose in the morning until you feel better Use ASTELIN a prescribed spray : 2 nasal sprays on each side of the nose at night until you feel better    Call if not gradually better over the next  10 days  Call anytime if the symptoms are severe

## 2018-01-14 LAB — URINE CULTURE
MICRO NUMBER:: 90799227
SPECIMEN QUALITY:: ADEQUATE

## 2018-01-14 LAB — URINALYSIS, ROUTINE W REFLEX MICROSCOPIC
Bacteria, UA: NONE SEEN /HPF
Bilirubin Urine: NEGATIVE
Glucose, UA: NEGATIVE
HYALINE CAST: NONE SEEN /LPF
Hgb urine dipstick: NEGATIVE
Ketones, ur: NEGATIVE
Nitrite: NEGATIVE
PROTEIN: NEGATIVE
RBC / HPF: NONE SEEN /HPF (ref 0–2)
SPECIFIC GRAVITY, URINE: 1.008 (ref 1.001–1.03)
Squamous Epithelial / LPF: NONE SEEN /HPF (ref <=5)
pH: 7 (ref 5.0–8.0)

## 2018-01-14 NOTE — Assessment & Plan Note (Signed)
Acute visit, here with multiple concerns: Low back pain: Neurological exam is benign, question of mild right-sided radiculopathy?. Plan: Stretching, NSAIDs, GI precautions discussed.  Also Flexeril at night.  Drowsiness discussed.  Will call if not improving in few days for further eval. Dysuria: Sporadic symptom, check a UA urine culture. URI?:  Symptoms started to 3 days ago with sinus pain congestion and a mild headache.  Exam is benign, will treat conservatively with Flonase, Astelin.  Call if not better.

## 2018-03-01 DIAGNOSIS — S6392XA Sprain of unspecified part of left wrist and hand, initial encounter: Secondary | ICD-10-CM | POA: Diagnosis not present

## 2018-03-03 ENCOUNTER — Telehealth: Payer: Self-pay | Admitting: *Deleted

## 2018-03-03 NOTE — Telephone Encounter (Signed)
Received Medical records from [facility]; forwarded to provider/SLS 08/23

## 2018-03-15 ENCOUNTER — Encounter: Payer: Self-pay | Admitting: Internal Medicine

## 2018-03-17 ENCOUNTER — Ambulatory Visit (HOSPITAL_BASED_OUTPATIENT_CLINIC_OR_DEPARTMENT_OTHER)
Admission: RE | Admit: 2018-03-17 | Discharge: 2018-03-17 | Disposition: A | Discharge status: Home / Self Care | Payer: BLUE CROSS/BLUE SHIELD | Source: Ambulatory Visit | Attending: Internal Medicine | Admitting: Internal Medicine

## 2018-03-17 ENCOUNTER — Encounter: Payer: Self-pay | Admitting: Internal Medicine

## 2018-03-17 ENCOUNTER — Ambulatory Visit: Payer: BLUE CROSS/BLUE SHIELD | Admitting: Internal Medicine

## 2018-03-17 VITALS — BP 122/62 | HR 91 | Temp 98.1°F | Resp 14 | Ht 62.0 in | Wt 141.1 lb

## 2018-03-17 DIAGNOSIS — M79645 Pain in left finger(s): Secondary | ICD-10-CM

## 2018-03-17 DIAGNOSIS — S6992XA Unspecified injury of left wrist, hand and finger(s), initial encounter: Secondary | ICD-10-CM | POA: Diagnosis not present

## 2018-03-17 MED ORDER — PREDNISONE 10 MG PO TABS: ORAL_TABLET | ORAL | 0 refills | Status: DC

## 2018-03-17 NOTE — Progress Notes (Signed)
Subjective:    Patient ID: Karen Case, female    DOB: January 01, 1966, 52 y.o.   MRN: [id number]  DOS:  03/17/2018 Type of visit - description : Acute visit Interval history: 4 weeks ago she fell, injury to her left thumb.  Denies other injuries. Developed pain and swelling at the base of the left thumb, went to urgent care, x-ray was normal, was recommended Aleve. Here because the area continues to bother her. Pain increases with gripping.   Review of Systems Denies wrist or other fingers pain. Overall, swelling has minimally decreased over the last 2 weeks  Past Medical History:  Diagnosis Date  . Birth control    husband's vasectomy  . Chronic constipation 06/30/2011  . GERD (gastroesophageal reflux disease)     Past Surgical History:  Procedure Laterality Date  . BUNIONECTOMY  2000   Right  . SKIN BIOPSY     R eye lid     Social History   Socioeconomic History  . Marital status: Married    Spouse name: Not on file  . Number of children: 2  . Years of education: Not on file  . Highest education level: Not on file  Occupational History  . Occupation:  Engineer, production  . Financial resource strain: Not on file  . Food insecurity:    Worry: Not on file    Inability: Not on file  . Transportation needs:    Medical: Not on file    Non-medical: Not on file  Tobacco Use  . Smoking status: Never Smoker  . Smokeless tobacco: Never Used  Substance and Sexual Activity  . Alcohol use: Yes    Alcohol/week: 0.0 standard drinks    Comment: socially   . Drug use: No  . Sexual activity: Not on file  Lifestyle  . Physical activity:    Days per week: Not on file    Minutes per session: Not on file  . Stress: Not on file  Relationships  . Social connections:    Talks on phone: Not on file    Gets together: Not on file    Attends religious service: Not on file    Active member of club or organization: Not on file    Attends meetings of clubs or organizations:  Not on file    Relationship status: Not on file  . Intimate partner violence:    Fear of current or ex partner: Not on file    Emotionally abused: Not on file    Physically abused: Not on file    Forced sexual activity: Not on file  Other Topics Concern  . Not on file  Social History Narrative   Original from [place], moved to [place] in 1994    2 daughters   [place]        Allergies as of 03/17/2018   No Known Allergies     Medication List        Accurate as of 03/17/18 11:59 PM. Always use your most recent med list.          azelastine 0.1 % nasal spray Commonly known as:  ASTELIN Place 2 sprays into both nostrils at bedtime as needed for rhinitis. Use in each nostril as directed   cyclobenzaprine 10 MG tablet Commonly known as:  FLEXERIL Take 1 tablet (10 mg total) by mouth at bedtime as needed for muscle spasms.   EXCEDRIN PO Take by mouth.   predniSONE  10 MG tablet Commonly known as:  DELTASONE 2 tabs a day x 5 days          Objective:   Physical Exam BP 122/62 (BP Location: Left Arm, Patient Position: Sitting, Cuff Size: Small)   Pulse 91   Temp 98.1 F (36.7 C) (Oral)   Resp 14   Ht 5\' 2"  (1.575 m)   Wt 141 lb 2 oz (64 kg)   LMP 07/15/2012   SpO2 98%   BMI 25.81 kg/m  General:   Well developed, NAD, see BMI.  HEENT:  Normocephalic . Face symmetric, atraumatic MSK: Right hand normal Left hand: Normal to inspection and palpation except for minimal swelling at the base of the thumb.  Range of motion essentially normal, no deformities Neurologic:  alert & oriented X3.  Speech normal, gait appropriate for age and unassisted Psych--  Cognition and judgment appear intact.  Cooperative with normal attention span and concentration.  Behavior appropriate. No anxious or depressed appearing.      Assessment & Plan:    Assessment Constipation, GERD Menopausal age ~28  Plan: Left thumb injury: To be sure will re-x-ray the  area, anti-inflammatory treatment with prednisone for 5 days, call if not gradually improving, suspect pain probably will self resolve.

## 2018-03-17 NOTE — Patient Instructions (Signed)
    STOP BY THE FIRST FLOOR:  get the XR    take prednisone as prescribed  Call if no better in 2 weeks

## 2018-03-17 NOTE — Progress Notes (Signed)
Pre visit review using our clinic review tool, if applicable. No additional management support is needed unless otherwise documented below in the visit note. 

## 2018-03-18 NOTE — Assessment & Plan Note (Signed)
Left thumb injury: To be sure will re-x-ray the area, anti-inflammatory treatment with prednisone for 5 days, call if not gradually improving, suspect pain probably will self resolve.

## 2018-04-18 ENCOUNTER — Ambulatory Visit: Payer: BLUE CROSS/BLUE SHIELD | Admitting: Internal Medicine

## 2018-04-18 ENCOUNTER — Encounter: Payer: Self-pay | Admitting: Internal Medicine

## 2018-04-18 VITALS — BP 118/64 | HR 64 | Temp 98.2°F | Resp 16 | Ht 62.0 in | Wt 142.2 lb

## 2018-04-18 DIAGNOSIS — M25561 Pain in right knee: Secondary | ICD-10-CM

## 2018-04-18 DIAGNOSIS — S6992XD Unspecified injury of left wrist, hand and finger(s), subsequent encounter: Secondary | ICD-10-CM

## 2018-04-18 NOTE — Progress Notes (Signed)
Pre visit review using our clinic review tool, if applicable. No additional management support is needed unless otherwise documented below in the visit note. 

## 2018-04-18 NOTE — Progress Notes (Signed)
Subjective:    Patient ID: Karen Case, female    DOB: [birth year], 52 y.o.   MRN: [id number]  DOS:  04/18/2018 Type of visit - description : acute Interval history: See last visit, she had a injury of the left thumb, continue with pain, range of motion has decreased a little. No swelling, no reinjury. Also having right anterior knee pain. Denies any redness, warmness, swelling. Worse with certain movements, sometimes has pain at night.   Review of Systems No fever chills  Past Medical History:  Diagnosis Date  . Birth control    husband's vasectomy  . Chronic constipation 06/30/2011  . GERD (gastroesophageal reflux disease)     Past Surgical History:  Procedure Laterality Date  . BUNIONECTOMY  2000   Right  . SKIN BIOPSY     R eye lid     Social History   Socioeconomic History  . Marital status: Married    Spouse name: Not on file  . Number of children: 2  . Years of education: Not on file  . Highest education level: Not on file  Occupational History  . Occupation:  Engineer, production  . Financial resource strain: Not on file  . Food insecurity:    Worry: Not on file    Inability: Not on file  . Transportation needs:    Medical: Not on file    Non-medical: Not on file  Tobacco Use  . Smoking status: Never Smoker  . Smokeless tobacco: Never Used  Substance and Sexual Activity  . Alcohol use: Yes    Alcohol/week: 0.0 standard drinks    Comment: socially   . Drug use: No  . Sexual activity: Not on file  Lifestyle  . Physical activity:    Days per week: Not on file    Minutes per session: Not on file  . Stress: Not on file  Relationships  . Social connections:    Talks on phone: Not on file    Gets together: Not on file    Attends religious service: Not on file    Active member of club or organization: Not on file    Attends meetings of clubs or organizations: Not on file    Relationship status: Not on file  . Intimate partner violence:   Fear of current or ex partner: Not on file    Emotionally abused: Not on file    Physically abused: Not on file    Forced sexual activity: Not on file  Other Topics Concern  . Not on file  Social History Narrative   Original from [place], moved to [place] in 1994    2 daughters   [place]        Allergies as of 04/18/2018   No Known Allergies     Medication List        Accurate as of 04/18/18 11:59 PM. Always use your most recent med list.          azelastine 0.1 % nasal spray Commonly known as:  ASTELIN Place 2 sprays into both nostrils at bedtime as needed for rhinitis. Use in each nostril as directed   cyclobenzaprine 10 MG tablet Commonly known as:  FLEXERIL Take 1 tablet (10 mg total) by mouth at bedtime as needed for muscle spasms.   EXCEDRIN PO Take by mouth.   predniSONE 10 MG tablet Commonly known as:  DELTASONE 2 tabs a day x 5 days  Objective:   Physical Exam  Musculoskeletal:       Hands:  BP 118/64 (BP Location: Left Arm, Patient Position: Sitting, Cuff Size: Small)   Pulse 64   Temp 98.2 F (36.8 C) (Oral)   Resp 16   Ht 5\' 2"  (1.575 m)   Wt 142 lb 4 oz (64.5 kg)   LMP 07/15/2012   SpO2 98%   BMI 26.02 kg/m  General:   Well developed, NAD, see BMI.  HEENT:  Normocephalic . Face symmetric, atraumatic  Skin: Not pale. Not jaundice MSK: Knees: Symmetric, no swelling, range of motion normal.  No deformity, no TTP. Neurologic:  alert & oriented X3.  Speech normal, gait appropriate for age and unassisted Psych--  Cognition and judgment appear intact.  Cooperative with normal attention span and concentration.  Behavior appropriate. No anxious or depressed appearing.      Assessment & Plan:     Assessment Constipation, GERD Menopausal age ~44  Plan: Left thumb injury: Incident happened approximately 8 weeks ago, x-ray at a urgent care was negative, ongoing symptoms, refer to orthopedic/hand. Knee  pain: Exam is benign, pain anterior, recommend conservative treatment with ice, rest, judicious use of Tylenol and ibuprofen, call if not better, sports medicine referral?.

## 2018-04-18 NOTE — Patient Instructions (Signed)
Will refer you to the hand specialist

## 2018-04-19 NOTE — Assessment & Plan Note (Signed)
Left thumb injury: Incident happened approximately 8 weeks ago, x-ray at a urgent care was negative, ongoing symptoms, refer to orthopedic/hand. Knee pain: Exam is benign, pain anterior, recommend conservative treatment with ice, rest, judicious use of Tylenol and ibuprofen, call if not better, sports medicine referral?.

## 2018-05-04 DIAGNOSIS — M1812 Unilateral primary osteoarthritis of first carpometacarpal joint, left hand: Secondary | ICD-10-CM | POA: Diagnosis not present

## 2018-05-04 DIAGNOSIS — M79645 Pain in left finger(s): Secondary | ICD-10-CM | POA: Diagnosis not present

## 2018-05-17 DIAGNOSIS — H16223 Keratoconjunctivitis sicca, not specified as Sjogren's, bilateral: Secondary | ICD-10-CM | POA: Diagnosis not present

## 2018-05-30 DIAGNOSIS — M79645 Pain in left finger(s): Secondary | ICD-10-CM | POA: Diagnosis not present

## 2018-06-21 DIAGNOSIS — H16223 Keratoconjunctivitis sicca, not specified as Sjogren's, bilateral: Secondary | ICD-10-CM | POA: Diagnosis not present

## 2018-07-24 ENCOUNTER — Encounter: Payer: Self-pay | Admitting: Internal Medicine

## 2018-07-24 ENCOUNTER — Ambulatory Visit (INDEPENDENT_AMBULATORY_CARE_PROVIDER_SITE_OTHER): Payer: BLUE CROSS/BLUE SHIELD | Admitting: Internal Medicine

## 2018-07-24 VITALS — BP 98/60 | HR 71 | Temp 98.1°F | Resp 16 | Ht 62.0 in | Wt 141.2 lb

## 2018-07-24 DIAGNOSIS — W57XXXA Bitten or stung by nonvenomous insect and other nonvenomous arthropods, initial encounter: Secondary | ICD-10-CM

## 2018-07-24 DIAGNOSIS — S30861A Insect bite (nonvenomous) of abdominal wall, initial encounter: Secondary | ICD-10-CM | POA: Diagnosis not present

## 2018-07-24 DIAGNOSIS — Z0000 Encounter for general adult medical examination without abnormal findings: Secondary | ICD-10-CM | POA: Diagnosis not present

## 2018-07-24 LAB — COMPREHENSIVE METABOLIC PANEL
ALBUMIN: 4.3 g/dL (ref 3.5–5.2)
ALK PHOS: 85 U/L (ref 39–117)
ALT: 46 U/L — ABNORMAL HIGH (ref 0–35)
AST: 31 U/L (ref 0–37)
BUN: 13 mg/dL (ref 6–23)
CALCIUM: 9.8 mg/dL (ref 8.4–10.5)
CHLORIDE: 103 meq/L (ref 96–112)
CO2: 33 mEq/L — ABNORMAL HIGH (ref 19–32)
Creatinine, Ser: 0.71 mg/dL (ref 0.40–1.20)
GFR: 91.98 mL/min (ref >60.00)
Glucose, Bld: 101 mg/dL — ABNORMAL HIGH (ref 70–99)
Potassium: 4.3 mEq/L (ref 3.5–5.1)
SODIUM: 141 meq/L (ref 135–145)
Total Bilirubin: 0.7 mg/dL (ref 0.2–1.2)
Total Protein: 6.8 g/dL (ref 6.0–8.3)

## 2018-07-24 LAB — CBC WITH DIFFERENTIAL/PLATELET
BASOS PCT: 0.8 % (ref 0.0–3.0)
Basophils Absolute: 0 10*3/uL (ref 0.0–0.1)
EOS ABS: 0.4 10*3/uL (ref 0.0–0.7)
EOS PCT: 8.7 % — AB (ref 0.0–5.0)
HCT: 39.7 % (ref 36.0–46.0)
Hemoglobin: 13.7 g/dL (ref 12.0–15.0)
Lymphocytes Relative: 25.5 % (ref 12.0–46.0)
Lymphs Abs: 1.3 10*3/uL (ref 0.7–4.0)
MCHC: 34.6 g/dL (ref 30.0–36.0)
MCV: 96.3 fl (ref 78.0–100.0)
MONO ABS: 0.4 10*3/uL (ref 0.1–1.0)
Monocytes Relative: 7.2 % (ref 3.0–12.0)
NEUTROS ABS: 3 10*3/uL (ref 1.4–7.7)
Neutrophils Relative %: 57.8 % (ref 43.0–77.0)
PLATELETS: 377 10*3/uL (ref 150.0–400.0)
RBC: 4.12 Mil/uL (ref 3.87–5.11)
RDW: 12.3 % (ref 11.5–15.5)
WBC: 5.1 10*3/uL (ref 4.0–10.5)

## 2018-07-24 LAB — LIPID PANEL
CHOLESTEROL: 196 mg/dL (ref 0–200)
HDL: 56.7 mg/dL (ref >39.00)
LDL CALC: 122 mg/dL — AB (ref 0–99)
NonHDL: 139.18
TRIGLYCERIDES: 87 mg/dL (ref 0.0–149.0)
Total CHOL/HDL Ratio: 3
VLDL: 17.4 mg/dL (ref 0.0–40.0)

## 2018-07-24 LAB — CK: CK TOTAL: 45 U/L (ref 7–177)

## 2018-07-24 LAB — SEDIMENTATION RATE: SED RATE: 1 mm/h (ref 0–30)

## 2018-07-24 NOTE — Patient Instructions (Addendum)
GO TO THE LAB : Get the blood work     GO TO THE FRONT DESK Schedule your next appointment for a  Check up in 2 months   Vitamin D ~ 1,000 units daily   HEALTHY SLEEP Sleep hygiene: Basic rules for a good night's sleep  Sleep only as much as you need to feel rested and then get out of bed  Keep a regular sleep schedule  Avoid forcing sleep  Exercise regularly for at least 20 minutes, preferably 4 to 5 hours before bedtime  Avoid caffeinated beverages after lunch  Avoid alcohol near bedtime: no "night cap"  Avoid smoking, especially in the evening  Do not go to bed hungry  Adjust bedroom environment  Avoid prolonged use of light-emitting screens before bedtime   Deal with your worries before bedtime    Melatonin at night

## 2018-07-24 NOTE — Progress Notes (Signed)
Pre visit review using our clinic review tool, if applicable. No additional management support is needed unless otherwise documented below in the visit note. 

## 2018-07-24 NOTE — Assessment & Plan Note (Signed)
-  Td 2012, flu shot at work -CCS:  s/p cscope, 09/2017, next per GI -Female care per gyn , [facility] 10/2017 -Diet-exercise discussed.  - rec vit D qd -Labs : CMP, FLP, CBC, sed rate, CK, Lyme serology

## 2018-07-24 NOTE — Progress Notes (Addendum)
Subjective:    Patient ID: Karen Case, female    DOB: September 01, 1965, 53 y.o.   MRN: [id number]  DOS:  07/24/2018 Type of visit - description: CPX  Reports multiple symptoms  Review of Systems 2 to 3 months history of generalized achiness, at the muscles and joints, increase by walking.  Does not recall seeing any joint at the hand or wrists swollen or red but they do feel stiff. She is concerned because approximately 2 months ago she removed 2 ticks from her abdomen, area was a slightly red but not in a bull's eye fashion.  Does not recall if ticks were flat or engorged.  Lyme disease?Marland Kitchen She denies fever chills or weight loss, some headaches occasionally. No rash  3 months history of insomnia, fall asleep okay but then she wakes up. History of menopause, hot flashes are decreasing No particular anxious or stressed    Other than above, a 14 point review of systems is negative    Past Medical History:  Diagnosis Date  . Birth control    husband's vasectomy  . Chronic constipation 06/30/2011  . GERD (gastroesophageal reflux disease)     Past Surgical History:  Procedure Laterality Date  . BUNIONECTOMY  2000   Right  . SKIN BIOPSY     R eye lid     Social History   Socioeconomic History  . Marital status: Married    Spouse name: Not on file  . Number of children: 2  . Years of education: Not on file  . Highest education level: Not on file  Occupational History  . Occupation: part time job  Social Needs  . Financial resource strain: Not on file  . Food insecurity:    Worry: Not on file    Inability: Not on file  . Transportation needs:    Medical: Not on file    Non-medical: Not on file  Tobacco Use  . Smoking status: Never Smoker  . Smokeless tobacco: Never Used  Substance and Sexual Activity  . Alcohol use: Yes    Alcohol/week: 0.0 standard drinks    Comment: socially   . Drug use: No  . Sexual activity: Not on file  Lifestyle  . Physical activity:      Days per week: Not on file    Minutes per session: Not on file  . Stress: Not on file  Relationships  . Social connections:    Talks on phone: Not on file    Gets together: Not on file    Attends religious service: Not on file    Active member of club or organization: Not on file    Attends meetings of clubs or organizations: Not on file    Relationship status: Not on file  . Intimate partner violence:    Fear of current or ex partner: Not on file    Emotionally abused: Not on file    Physically abused: Not on file    Forced sexual activity: Not on file  Other Topics Concern  . Not on file  Social History Narrative   Original from [place], moved to [place] in 1994    2 daughters   [place]     Family History  Problem Relation Age of Onset  . Hypertension Mother        M  . Hyperlipidemia Neg Hx   . Coronary artery disease Neg Hx   . Stroke Neg Hx   . Colon  cancer Neg Hx   . Breast cancer Neg Hx   . Esophageal cancer Neg Hx   . Liver cancer Neg Hx   . Pancreatic cancer Neg Hx   . Prostate cancer Neg Hx   . Rectal cancer Neg Hx   . Stomach cancer Neg Hx      Allergies as of 07/24/2018   No Known Allergies     Medication List    as of July 24, 2018  5:54 PM   You have not been prescribed any medications.         Objective:   Physical Exam BP 98/60 (BP Location: Left Arm, Patient Position: Sitting, Cuff Size: Small)   Pulse 71   Temp 98.1 F (36.7 C) (Oral)   Resp 16   Ht 5\' 2"  (1.575 m)   Wt 141 lb 4 oz (64.1 kg)   LMP 07/15/2012   SpO2 97%   BMI 25.83 kg/m   General: Well developed, NAD, BMI noted Neck: No  thyromegaly  HEENT:  Normocephalic . Face symmetric, atraumatic Lungs:  CTA B Normal respiratory effort, no intercostal retractions, no accessory muscle use. Heart: RRR,  no murmur.  No pretibial edema bilaterally  Abdomen:  Not distended, soft, non-tender. No rebound or rigidity.   Skin: Exposed areas without  rash. Not pale. Not jaundice MSK: Hands and wrists without synovitis Neurologic:  alert & oriented X3.  Speech normal, gait appropriate for age and unassisted Strength symmetric and appropriate for age.  Psych: Cognition and judgment appear intact.  Cooperative with normal attention span and concentration.  Behavior appropriate. No anxious or depressed appearing.     Assessment     Assessment Constipation, GERD Menopausal age ~72  Plan: Here for CPX, has multiple concerns Myalgias, arthralgias, h/o tick bite: Exam seems normal, will check CK, sed rate and Lyme serology.  Explained to patient that Lyme serology might be difficult to interpretate Insomnia: As described above, Benadryl initially helped but then it stopped helping.  Recommend good sleep hygiene,ie  if she wakes up in middle of the night,  needs to get out of bed for 30 minutes then go back to bed.  Okay to take melatonin. Low BP: Not unusual for her, no actual dizziness at this point. RTC 2 months to reassess multiple symptoms

## 2018-07-24 NOTE — Assessment & Plan Note (Addendum)
Here for CPX, has multiple concerns Myalgias, arthralgias, h/o tick bite: Exam seems normal, will check CK, sed rate and Lyme serology.  Explained to patient that Lyme serology might be difficult to interpretate Insomnia: As described above, Benadryl initially helped but then it stopped helping.  Recommend good sleep hygiene,ie  if she wakes up in middle of the night,  needs to get out of bed for 30 minutes then go back to bed.  Okay to take melatonin. Low BP: Not unusual for her, no actual dizziness at this point. RTC 2 months to reassess multiple symptoms

## 2018-07-25 LAB — B. BURGDORFI ANTIBODIES

## 2018-09-13 DIAGNOSIS — S60939A Unspecified superficial injury of unspecified thumb, initial encounter: Secondary | ICD-10-CM | POA: Diagnosis not present

## 2018-09-18 DIAGNOSIS — M79644 Pain in right finger(s): Secondary | ICD-10-CM | POA: Diagnosis not present

## 2018-09-18 DIAGNOSIS — S60111A Contusion of right thumb with damage to nail, initial encounter: Secondary | ICD-10-CM | POA: Diagnosis not present

## 2018-09-22 ENCOUNTER — Ambulatory Visit: Payer: BLUE CROSS/BLUE SHIELD | Admitting: Internal Medicine

## 2018-09-26 ENCOUNTER — Ambulatory Visit: Payer: BLUE CROSS/BLUE SHIELD | Admitting: Internal Medicine

## 2018-10-03 ENCOUNTER — Ambulatory Visit (INDEPENDENT_AMBULATORY_CARE_PROVIDER_SITE_OTHER): Payer: BLUE CROSS/BLUE SHIELD | Admitting: Internal Medicine

## 2018-10-03 ENCOUNTER — Encounter: Payer: Self-pay | Admitting: Internal Medicine

## 2018-10-03 ENCOUNTER — Other Ambulatory Visit: Payer: Self-pay

## 2018-10-03 VITALS — BP 116/70 | HR 72 | Temp 98.1°F | Resp 16 | Ht 62.0 in | Wt 141.2 lb

## 2018-10-03 DIAGNOSIS — R21 Rash and other nonspecific skin eruption: Secondary | ICD-10-CM | POA: Diagnosis not present

## 2018-10-03 DIAGNOSIS — M2550 Pain in unspecified joint: Secondary | ICD-10-CM | POA: Diagnosis not present

## 2018-10-03 MED ORDER — KETOCONAZOLE 2 % EX CREA: 1.0000 "application " | TOPICAL_CREAM | Freq: Every day | CUTANEOUS | 0 refills | Status: DC

## 2018-10-03 NOTE — Progress Notes (Signed)
Subjective:    Patient ID: Karen Case, female    DOB: 1966/05/27, 53 y.o.   MRN: [id number]  DOS:  10/03/2018 Type of visit - description: f/u See last office visit, she had multiple complaints. Arthralgias, myalgias: Still have symptoms, mostly located at the shoulders, fingers (PIPs bilaterally) and the right hip. No knee pain, no wrist pain.  Also had a rash few weeks ago below the left breast, looks better but is still itching.   Review of Systems Insomnia: Improved Denies fever, chills, weight loss, rash. When asked, admits to ill-defined paresthesias in both hands mostly at the fingers, worse in the morning, occasional neck pain.  Past Medical History:  Diagnosis Date  . Birth control    husband's vasectomy  . Chronic constipation 06/30/2011  . GERD (gastroesophageal reflux disease)     Past Surgical History:  Procedure Laterality Date  . BUNIONECTOMY  2000   Right  . SKIN BIOPSY     R eye lid     Social History   Socioeconomic History  . Marital status: Married    Spouse name: Not on file  . Number of children: 2  . Years of education: Not on file  . Highest education level: Not on file  Occupational History  . Occupation: part time job  Social Needs  . Financial resource strain: Not on file  . Food insecurity:    Worry: Not on file    Inability: Not on file  . Transportation needs:    Medical: Not on file    Non-medical: Not on file  Tobacco Use  . Smoking status: Never Smoker  . Smokeless tobacco: Never Used  Substance and Sexual Activity  . Alcohol use: Yes    Alcohol/week: 0.0 standard drinks    Comment: socially   . Drug use: No  . Sexual activity: Not on file  Lifestyle  . Physical activity:    Days per week: Not on file    Minutes per session: Not on file  . Stress: Not on file  Relationships  . Social connections:    Talks on phone: Not on file    Gets together: Not on file    Attends religious service: Not on file    Active  member of club or organization: Not on file    Attends meetings of clubs or organizations: Not on file    Relationship status: Not on file  . Intimate partner violence:    Fear of current or ex partner: Not on file    Emotionally abused: Not on file    Physically abused: Not on file    Forced sexual activity: Not on file  Other Topics Concern  . Not on file  Social History Narrative   Original from [place], moved to [place] in 1994    2 daughters   [employer]        Allergies as of 10/03/2018   No Known Allergies     Medication List    as of October 03, 2018 10:38 AM   You have not been prescribed any medications.         Objective:   Physical Exam Skin:        BP 116/70 (BP Location: Left Arm, Patient Position: Sitting, Cuff Size: Small)   Pulse 72   Temp 98.1 F (36.7 C) (Oral)   Resp 16   Ht 5\' 2"  (1.575 m)   Wt 141 lb 4 oz (  64.1 kg)   LMP 07/15/2012   SpO2 98%   BMI 25.83 kg/m  General:   Well developed, NAD, BMI noted. HEENT:  Normocephalic . Face symmetric, atraumatic Lungs:  CTA B Normal respiratory effort, no intercostal retractions, no accessory muscle use. Heart: RRR,  no murmur.  No pretibial edema bilaterally  MSK: Hands and wrists: No synovitis on exam, no muscle atrophy Neurologic:  alert & oriented X3.  Speech normal, gait appropriate for age and unassisted Psych--  Cognition and judgment appear intact.  Cooperative with normal attention span and concentration.  Behavior appropriate. No anxious or depressed appearing.      Assessment      Assessment Constipation, GERD Menopausal age ~31  Plan: Myalgias, arthralgias: Symptoms are about the same.  Recent labs including sed rate/CK/Lyme serology were negative.  Low suspicious for inflammatory arthritis, DJD?Marland Kitchen  The patient remains concerned, will refer to rheumatology.  She may need further eval. Rash: Likely fungal: Ketoconazole and hydrocortisone OTC twice a  day for 1 week Insomnia: Essentially resolved RTC 6 months

## 2018-10-03 NOTE — Assessment & Plan Note (Signed)
Myalgias, arthralgias: Symptoms are about the same.  Recent labs including sed rate/CK/Lyme serology were negative.  Low suspicious for inflammatory arthritis, DJD?Marland Kitchen  The patient remains concerned, will refer to rheumatology.  She may need further eval. Rash: Likely fungal: Ketoconazole and hydrocortisone OTC twice a day for 1 week Insomnia: Essentially resolved RTC 6 months

## 2018-10-03 NOTE — Patient Instructions (Signed)
  GO TO THE FRONT DESK Schedule your next appointment for a check up in 6 months     

## 2018-10-03 NOTE — Progress Notes (Signed)
Pre visit review using our clinic review tool, if applicable. No additional management support is needed unless otherwise documented below in the visit note. 

## 2018-10-12 DIAGNOSIS — Z01419 Encounter for gynecological examination (general) (routine) without abnormal findings: Secondary | ICD-10-CM | POA: Diagnosis not present

## 2018-10-27 ENCOUNTER — Emergency Department (HOSPITAL_COMMUNITY)
Admission: EM | Admit: 2018-10-27 | Discharge: 2018-10-28 | Disposition: A | Discharge status: Home / Self Care | Payer: BLUE CROSS/BLUE SHIELD | Attending: Emergency Medicine | Admitting: Emergency Medicine

## 2018-10-27 ENCOUNTER — Other Ambulatory Visit: Payer: Self-pay

## 2018-10-27 ENCOUNTER — Encounter (HOSPITAL_COMMUNITY): Payer: Self-pay | Admitting: Emergency Medicine

## 2018-10-27 ENCOUNTER — Emergency Department (HOSPITAL_COMMUNITY): Payer: BLUE CROSS/BLUE SHIELD

## 2018-10-27 DIAGNOSIS — R51 Headache: Secondary | ICD-10-CM | POA: Insufficient documentation

## 2018-10-27 DIAGNOSIS — R202 Paresthesia of skin: Secondary | ICD-10-CM | POA: Diagnosis not present

## 2018-10-27 DIAGNOSIS — R200 Anesthesia of skin: Secondary | ICD-10-CM | POA: Insufficient documentation

## 2018-10-27 DIAGNOSIS — G4489 Other headache syndrome: Secondary | ICD-10-CM | POA: Diagnosis not present

## 2018-10-27 DIAGNOSIS — Z79899 Other long term (current) drug therapy: Secondary | ICD-10-CM | POA: Insufficient documentation

## 2018-10-27 LAB — DIFFERENTIAL
Abs Immature Granulocytes: 0.01 10*3/uL (ref 0.00–0.07)
Basophils Absolute: 0.1 10*3/uL (ref 0.0–0.1)
Basophils Relative: 1 %
Eosinophils Absolute: 0.4 10*3/uL (ref 0.0–0.5)
Eosinophils Relative: 7 %
Immature Granulocytes: 0 %
Lymphocytes Relative: 37 %
Lymphs Abs: 2.1 10*3/uL (ref 0.7–4.0)
Monocytes Absolute: 0.4 10*3/uL (ref 0.1–1.0)
Monocytes Relative: 7 %
Neutro Abs: 2.8 10*3/uL (ref 1.7–7.7)
Neutrophils Relative %: 48 %

## 2018-10-27 LAB — COMPREHENSIVE METABOLIC PANEL
ALT: 30 U/L (ref 0–44)
AST: 22 U/L (ref 15–41)
Albumin: 4.3 g/dL (ref 3.5–5.0)
Alkaline Phosphatase: 91 U/L (ref 38–126)
Anion gap: 10 (ref 5–15)
BUN: 7 mg/dL (ref 6–20)
CO2: 25 mmol/L (ref 22–32)
Calcium: 9.3 mg/dL (ref 8.9–10.3)
Chloride: 105 mmol/L (ref 98–111)
Creatinine, Ser: 0.63 mg/dL (ref 0.44–1.00)
GFR calc Af Amer: >60 mL/min (ref >60)
GFR calc non Af Amer: >60 mL/min (ref >60)
Glucose, Bld: 104 mg/dL — ABNORMAL HIGH (ref 70–99)
Potassium: 3.9 mmol/L (ref 3.5–5.1)
Sodium: 140 mmol/L (ref 135–145)
Total Bilirubin: 0.4 mg/dL (ref 0.3–1.2)
Total Protein: 7.3 g/dL (ref 6.5–8.1)

## 2018-10-27 LAB — I-STAT BETA HCG BLOOD, ED (MC, WL, AP ONLY): I-stat hCG, quantitative: <5 m[IU]/mL (ref <5)

## 2018-10-27 LAB — CBC
HCT: 40.9 % (ref 36.0–46.0)
Hemoglobin: 13.4 g/dL (ref 12.0–15.0)
MCH: 31.7 pg (ref 26.0–34.0)
MCHC: 32.8 g/dL (ref 30.0–36.0)
MCV: 96.7 fL (ref 80.0–100.0)
Platelets: 354 10*3/uL (ref 150–400)
RBC: 4.23 MIL/uL (ref 3.87–5.11)
RDW: 11.4 % — ABNORMAL LOW (ref 11.5–15.5)
WBC: 5.8 10*3/uL (ref 4.0–10.5)
nRBC: 0 % (ref 0.0–0.2)

## 2018-10-27 LAB — APTT: aPTT: 26 seconds (ref 24–36)

## 2018-10-27 LAB — I-STAT CREATININE, ED: Creatinine, Ser: 0.6 mg/dL (ref 0.44–1.00)

## 2018-10-27 LAB — PROTIME-INR
INR: 1 (ref 0.8–1.2)
Prothrombin Time: 13.2 seconds (ref 11.4–15.2)

## 2018-10-27 MED ORDER — SODIUM CHLORIDE 0.9% FLUSH: 3.0000 mL | Freq: Once | INTRAVENOUS | Status: DC

## 2018-10-27 NOTE — ED Triage Notes (Signed)
Sent from [facility] clinic for numbness and subjective weakness on the right side of her body.  No neuro deficits in triage.  Dr Sabra Heck ordered stroke work up - out side of window

## 2018-10-28 MED ORDER — ACETAMINOPHEN 325 MG PO TABS
650.0000 mg | ORAL_TABLET | Freq: Once | ORAL | Status: AC
  Administered 2018-10-28: 650 mg via ORAL
  Filled 2018-10-28: qty 2

## 2018-10-28 NOTE — ED Provider Notes (Signed)
[facility]PART[facility] Provider Note   CSN: [id number] Arrival date & time: 10/27/18  1951    History   Chief Complaint Chief Complaint  Patient presents with  . Numbness  . Weakness    HPI Karen Case is a 53 y.o. female.     The history is provided by the patient.  Headache  Pain location:  Generalized Onset quality:  Gradual Timing:  Constant Progression:  Worsening Chronicity:  New Relieved by:  Nothing Worsened by:  Nothing Associated symptoms: numbness   Associated symptoms: no abdominal pain, no blurred vision, no fever, no visual change, no vomiting and no weakness   Patient reports onset of generalized headache several days ago.  This was then followed with numbness throughout her face.  The numbness was mostly on the right side.  No weakness or numbness in her arms or legs.  No difficulty speaking.  No visual changes.  She recalls hitting her head about 2 weeks ago but no other injuries.  No history of stroke. Seen at a walk-in clinic and sent for further evaluation.  Past Medical History:  Diagnosis Date  . Birth control    husband's vasectomy  . Chronic constipation 06/30/2011  . GERD (gastroesophageal reflux disease)     Patient Active Problem List   Diagnosis Date Noted  . Left knee pain 10/02/2017  . PCP NOTES >>>>> 03/28/2015  . Lumbalgia 08/22/2014  . Annual physical exam 06/30/2011  . Chronic constipation 06/30/2011  . Neck pain 06/30/2011    Past Surgical History:  Procedure Laterality Date  . BUNIONECTOMY  2000   Right  . SKIN BIOPSY     R eye lid      OB History   No obstetric history on file.      Home Medications    Prior to Admission medications   Medication Sig Start Date End Date Taking? Authorizing Provider  cholecalciferol (VITAMIN D3) 25 MCG (1000 UT) tablet Take 1,000 Units by mouth daily.   Yes [provider]  ketoconazole (NIZORAL) 2 % cream Apply 1 application topically  daily. Patient not taking: Reported on 10/28/2018 10/03/18   Colon Branch, MD    Family History Family History  Problem Relation Age of Onset  . Hypertension Mother        M  . Hyperlipidemia Neg Hx   . Coronary artery disease Neg Hx   . Stroke Neg Hx   . Colon cancer Neg Hx   . Breast cancer Neg Hx   . Esophageal cancer Neg Hx   . Liver cancer Neg Hx   . Pancreatic cancer Neg Hx   . Prostate cancer Neg Hx   . Rectal cancer Neg Hx   . Stomach cancer Neg Hx     Social History Social History   Tobacco Use  . Smoking status: Never Smoker  . Smokeless tobacco: Never Used  Substance Use Topics  . Alcohol use: Yes    Alcohol/week: 0.0 standard drinks    Comment: socially   . Drug use: No     Allergies   Patient has no known allergies.   Review of Systems Review of Systems  Constitutional: Negative for fever.  Eyes: Negative for blurred vision.  Respiratory: Negative for shortness of breath.   Gastrointestinal: Negative for abdominal pain and vomiting.  Neurological: Positive for numbness and headaches. Negative for facial asymmetry, speech difficulty and weakness.  All other systems reviewed and are negative.  Physical Exam Updated Vital Signs BP 133/83   Pulse 75   Temp 97.9 F (36.6 C) (Oral)   Resp 19   Ht 1.575 m (5\' 2" )   Wt 64.9 kg   LMP 07/15/2012   SpO2 98%   BMI 26.16 kg/m   Physical Exam  CONSTITUTIONAL: Well developed/well nourished HEAD: Normocephalic/atraumatic EYES: EOMI/PERRL, no nystagmus, no ptosis, no visual fields deficits ENMT: Mucous membranes moist NECK: supple no meningeal signs, no bruits SPINE/BACK:entire spine nontender CV: S1/S2 noted, no murmurs/rubs/gallops noted LUNGS: Lungs are clear to auscultation bilaterally, no apparent distress ABDOMEN: soft, nontender, no rebound or guarding GU:no cva tenderness NEURO:Awake/alert, face symmetric, no arm or leg drift is noted Equal 5/5 strength with shoulder abduction, elbow  flex/extension, wrist flex/extension in upper extremities and equal hand grips bilaterally Equal 5/5 strength with hip flexion,knee flex/extension, foot dorsi/plantar flexion Cranial nerves 3/4/5/6/01/17/09/11/12 tested and intact Gait normal without ataxia No past pointing Sensation to light touch intact in all extremities EXTREMITIES: pulses normal, full ROM SKIN: warm, color normal PSYCH: no abnormalities of mood noted, alert and oriented to situation   ED Treatments / Results  Labs (all labs ordered are listed, but only abnormal results are displayed) Labs Reviewed  CBC - Abnormal; Notable for the following components:      Result Value   RDW 11.4 (*)    All other components within normal limits  COMPREHENSIVE METABOLIC PANEL - Abnormal; Notable for the following components:   Glucose, Bld 104 (*)    All other components within normal limits  PROTIME-INR  APTT  DIFFERENTIAL  I-STAT CREATININE, ED  I-STAT BETA HCG BLOOD, ED ([facility], [facility], [facility] ONLY)    EKG EKG Interpretation  Date/Time:  Saturday October 28 2018 00:55:01 EDT Ventricular Rate:  72 PR Interval:    QRS Duration: 84 QT Interval:  385 QTC Calculation: 422 R Axis:   83 Text Interpretation:  Sinus rhythm Confirmed by Ripley Fraise [phone]) on 10/28/2018 12:59:52 AM   Radiology Ct Head Wo Contrast  Result Date: 10/27/2018 CLINICAL DATA:  Severe headache. Head numbness. Symptoms for 3 days. EXAM: CT HEAD WITHOUT CONTRAST TECHNIQUE: Contiguous axial images were obtained from the base of the skull through the vertex without intravenous contrast. COMPARISON:  Brain MRI 02/09/2011 FINDINGS: Brain: There is no evidence of acute infarct, intracranial hemorrhage, mass, midline shift, or extra-axial fluid collection. The ventricles and sulci are normal. There is a subtle focus of mild hypoattenuation anterior to the left putamen corresponding to the T2/FLAIR hyperintensity on prior MRI. Vascular: No hyperdense vessel. Skull: No  fracture or focal osseous lesion. Sinuses/Orbits: Visualized paranasal sinuses and mastoid air cells are clear. Orbits are unremarkable. Other: None. IMPRESSION: No evidence of acute intracranial abnormality. Electronically Signed   By: Logan Bores M.D.   On: 10/27/2018 20:58    Procedures Procedures  Medications Ordered in ED Medications  sodium chloride flush (NS) 0.9 % injection 3 mL (3 mLs Intravenous Not Given 10/28/18 0131)  acetaminophen (TYLENOL) tablet 650 mg (650 mg Oral Given 10/28/18 0277)     Initial Impression / Assessment and Plan / ED Course  I have reviewed the triage vital signs and the nursing notes.  Pertinent labs  results that were available during my care of the patient were reviewed by me and considered in my medical decision making (see chart for details).        Work-up is negative.  Patient has no focal neurologic deficits on my exam. Reports ongoing symptoms for  days, mostly headache. She is well-appearing walking around the ER  My suspicion for acute neurologic emergency is low  Will discharge home.  We discussed tricked return precautions Final Clinical Impressions(s) / ED Diagnoses   Final diagnoses:  Other headache syndrome    ED Discharge Orders    None       Ripley Fraise, MD 10/28/18 [phone]

## 2018-10-28 NOTE — Discharge Instructions (Addendum)

## 2018-10-30 ENCOUNTER — Telehealth: Payer: Self-pay

## 2018-10-30 NOTE — Telephone Encounter (Signed)
LMOM informing Pt to return call to schedule virtual ED f/u.

## 2018-11-17 ENCOUNTER — Ambulatory Visit (INDEPENDENT_AMBULATORY_CARE_PROVIDER_SITE_OTHER): Payer: BLUE CROSS/BLUE SHIELD | Admitting: Internal Medicine

## 2018-11-17 ENCOUNTER — Encounter: Payer: Self-pay | Admitting: Internal Medicine

## 2018-11-17 ENCOUNTER — Other Ambulatory Visit: Payer: Self-pay

## 2018-11-17 DIAGNOSIS — R202 Paresthesia of skin: Secondary | ICD-10-CM

## 2018-11-17 DIAGNOSIS — S6990XA Unspecified injury of unspecified wrist, hand and finger(s), initial encounter: Secondary | ICD-10-CM

## 2018-11-17 NOTE — Progress Notes (Signed)
Subjective:    Patient ID: Karen Case, female    DOB: 07/28/1965, 53 y.o.   MRN: [id number]  DOS:  11/17/2018 Type of visit - description: Virtual Visit via Video Note  I connected with@ on 11/17/18 at 11:00 AM EDT by a video enabled telemedicine application and verified that I am speaking with the correct person using two identifiers.   THIS ENCOUNTER IS A VIRTUAL VISIT DUE TO COVID-19 - PATIENT WAS NOT SEEN IN THE OFFICE. PATIENT HAS CONSENTED TO VIRTUAL VISIT / TELEMEDICINE VISIT   Location of patient: home  Location of provider: office  I discussed the limitations of evaluation and management by telemedicine and the availability of in person appointments. The patient expressed understanding and agreed to proceed.  History of Present Illness: Acute visit, has 2 issues:  On 11/10/2018, she lifted a heavy box, she felt a snap at the left middle finger and since then she has some pain and trigger phenomena at the DIP.  The area does not seem to be swelling but is TTP on self palpation.  Also, went to the ER 10/27/2018 with right-sided head numbness. Work-up included CBC and CMP were essentially normal. Head CT nonacute. The patient is somewhat concerned because he continue with mild sporadic numbness. She had similar symptoms in 2012.   Review of Systems Denies fever, chills or cough. Last month around the time of the ER visit she had right otalgia which is now resolved. No rash No dizziness, diplopia, slurred speech, motor deficits.  Also, she continues with some discomfort at the base of the left thumb  Past Medical History:  Diagnosis Date  . Birth control    husband's vasectomy  . Chronic constipation 06/30/2011  . GERD (gastroesophageal reflux disease)     Past Surgical History:  Procedure Laterality Date  . BUNIONECTOMY  2000   Right  . SKIN BIOPSY     R eye lid     Social History   Socioeconomic History  . Marital status: Married    Spouse name: Not  on file  . Number of children: 2  . Years of education: Not on file  . Highest education level: Not on file  Occupational History  . Occupation: part time job  Social Needs  . Financial resource strain: Not on file  . Food insecurity:    Worry: Not on file    Inability: Not on file  . Transportation needs:    Medical: Not on file    Non-medical: Not on file  Tobacco Use  . Smoking status: Never Smoker  . Smokeless tobacco: Never Used  Substance and Sexual Activity  . Alcohol use: Yes    Alcohol/week: 0.0 standard drinks    Comment: socially   . Drug use: No  . Sexual activity: Not on file  Lifestyle  . Physical activity:    Days per week: Not on file    Minutes per session: Not on file  . Stress: Not on file  Relationships  . Social connections:    Talks on phone: Not on file    Gets together: Not on file    Attends religious service: Not on file    Active member of club or organization: Not on file    Attends meetings of clubs or organizations: Not on file    Relationship status: Not on file  . Intimate partner violence:    Fear of current or ex partner: Not on file    Emotionally abused:  Not on file    Physically abused: Not on file    Forced sexual activity: Not on file  Other Topics Concern  . Not on file  Social History Narrative   Original from [place], moved to [place] in 1994    2 daughters   [place]        Allergies as of 11/17/2018   No Known Allergies     Medication List       Accurate as of Nov 17, 2018 11:00 AM. If you have any questions, ask your nurse or doctor.        cholecalciferol 25 MCG (1000 UT) tablet Commonly known as:  VITAMIN D3 Take 1,000 Units by mouth daily.   ketoconazole 2 % cream Commonly known as:  NIZORAL Apply 1 application topically daily.           Objective:   Physical Exam LMP 07/15/2012  This is a virtual video visit, alert oriented x3, speech clear and fluent, face seem symmetric, she  is in good spirits.    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  Tendon tear?  We agreed that she will call Dr. Burney Gauze who already know her and discussed the injury of the left middle finger and a somewhat persistent pain at the base of the thumb, likely DJD.  She will call me if she needs referral Paresthesias, right head: She went to the ER, CBC, CMP and CT head normal are nonacute. She still have on and off paresthesias. On chart review, in 2012 she had a work-up for right eye swelling, had CT and MRIs, see MRI reports below.  She had a right lacrimal gland biopsy showing chronic inflammation. At this point, the patient continue with paresthesias, right head, refer to neurology. MRIs from 2012: 1.  Single ovoid T2 and FLAIR hyperintensity along the anterior imb left internal capsule.  This may be within normal limits for patient of this age.  A demyelinating lesion or focal ischemic lesion is also included in the differential. 2.  No acute or focal abnormality to explain the patient's symptoms.  Impression. 1. No evidence of acute ischemia 2.  Continued presence of well-defined oval 6.2 mm hyperintensity in the anterior aspect of the left putamen extending to the anterior limb of the left internal capsule.  Differential considerations remain a focal area of gliosis possibly related to remote ischemia versus a a demyelinating process.  Similar changes can be associated with chronic vascular headaches.  Clinical correlation is suggested. 3. Moderate inflammatory mucosal changes in the paranasal sinuses.

## 2018-11-19 NOTE — Assessment & Plan Note (Signed)
Injury, left middle finger: Tendinitis?  Tendon tear?  We agreed that she will call Dr. Burney Gauze who already know her and discussed the injury of the left middle finger and a somewhat persistent pain at the base of the thumb, likely DJD.  She will call me if she needs referral Paresthesias, right head: She went to the ER, CBC, CMP and CT head normal are nonacute. She still have on and off paresthesias. On chart review, in 2012 she had a work-up for right eye swelling, had CT and MRIs, see MRI reports below.  She had a right lacrimal gland biopsy showing chronic inflammation. At this point, the patient continue with paresthesias, right head, refer to neurology. MRIs from 2012: 1.  Single ovoid T2 and FLAIR hyperintensity along the anterior imb left internal capsule.  This may be within normal limits for patient of this age.  A demyelinating lesion or focal ischemic lesion is also included in the differential. 2.  No acute or focal abnormality to explain the patient's symptoms.  Impression. 1. No evidence of acute ischemia 2.  Continued presence of well-defined oval 6.2 mm hyperintensity in the anterior aspect of the left putamen extending to the anterior limb of the left internal capsule.  Differential considerations remain a focal area of gliosis possibly related to remote ischemia versus a a demyelinating process.  Similar changes can be associated with chronic vascular headaches.  Clinical correlation is suggested. 3. Moderate inflammatory mucosal changes in the paranasal sinuses.

## 2018-11-22 NOTE — Progress Notes (Signed)
Virtual Visit via Video Note The purpose of this virtual visit is to provide medical care while limiting exposure to the novel coronavirus.    Consent was obtained for video visit:  Yes.   Answered questions that patient had about telehealth interaction:  Yes.   I discussed the limitations, risks, security and privacy concerns of performing an evaluation and management service by telemedicine. I also discussed with the patient that there may be a patient responsible charge related to this service. The patient expressed understanding and agreed to proceed.  Pt location: Home Physician Location: Home Name of referring provider:  Colon Branch, MD I connected with Karen Case at patients initiation/request on 11/23/2018 at  9:00 AM EDT by video enabled telemedicine application and verified that I am speaking with the correct person using two identifiers. Pt MRN:  [id number] Pt DOB:  25-Jun-1966 Video Participants:  Karen Case   History of Present Illness:  Karen Case is a 53 year old woman who presents for paresthesias.  History supplemented by ED and referring provider note.  In 2012, she developed swelling in the right eye and her eyelid was shut.  Eye exam was unremarkable but she noted that the entire right side of her head and face was numb from the back of her head and involving the V1-V3 distribution.  MRI of brain with and without contrast from 11/09/10 was personally reviewed and demonstrated single ovoid T2 and FLAIR hyperintensity along the putamen and anterior limb of the left internal capsule without abnormal enhancement.  She had a repeat MRI of brain with and without contrast on 02/09/11 which again demonstrated the non-enhancing 6.2 mm hyperintense lesion, unchanged.  Symptoms lasted about 2 months and resolved.  She went to the ED on 10/27/18 for a similar event.  They history she provides me is different than what is reported in the ED note.  The ED note states she that  several days prior to visit, she developed a diffuse headache followed by onset of right sided facial and head numbness.  The patient reports to me that she developed right sided numbness and tingling of the face and head in February and it never resolved.  She also denied associated headache.  Objective neurologic exam reportedly normal.  Labs including CBC and CMP were unremarkable.  CT of head was personally reviewed and demonstrated subtle hypodense focus anterior to the left putamen, corresponding to the prior lesion on MRI, but no acute abnormality.  She continues to have the numbness and paresthesias, unchanged.  There is no associated facial weakness, visual disturbance, gait abnormality or numbness or weakness of her extremities.  She did have a severe pounding headache last night, not associated with nausea, vomiting, photophobia or phonophobia, but that has since resolved.  She reports history of similar headaches once in awhile.  She has some occasional neck soreness but denies any significant neck pain or radicular pain down the arms.  She denies family history of neurologic diseases.  Past Medical History: Past Medical History:  Diagnosis Date  . Birth control    husband's vasectomy  . Chronic constipation 06/30/2011  . GERD (gastroesophageal reflux disease)     Medications: Outpatient Encounter Medications as of 11/23/2018  Medication Sig  . cholecalciferol (VITAMIN D3) 25 MCG (1000 UT) tablet Take 1,000 Units by mouth daily.  Marland Kitchen ketoconazole (NIZORAL) 2 % cream Apply 1 application topically daily. (Patient not taking: Reported on 10/28/2018)   No facility-administered encounter medications on  file as of 11/23/2018.     Allergies: No Known Allergies  Family History: Family History  Problem Relation Age of Onset  . Hypertension Mother        M  . Hyperlipidemia Neg Hx   . Coronary artery disease Neg Hx   . Stroke Neg Hx   . Colon cancer Neg Hx   . Breast cancer Neg Hx   .  Esophageal cancer Neg Hx   . Liver cancer Neg Hx   . Pancreatic cancer Neg Hx   . Prostate cancer Neg Hx   . Rectal cancer Neg Hx   . Stomach cancer Neg Hx     Social History: Social History   Socioeconomic History  . Marital status: Married    Spouse name: Not on file  . Number of children: 2  . Years of education: Not on file  . Highest education level: Not on file  Occupational History  . Occupation: part time job  Social Needs  . Financial resource strain: Not on file  . Food insecurity:    Worry: Not on file    Inability: Not on file  . Transportation needs:    Medical: Not on file    Non-medical: Not on file  Tobacco Use  . Smoking status: Never Smoker  . Smokeless tobacco: Never Used  Substance and Sexual Activity  . Alcohol use: Yes    Alcohol/week: 0.0 standard drinks    Comment: socially   . Drug use: No  . Sexual activity: Not on file  Lifestyle  . Physical activity:    Days per week: Not on file    Minutes per session: Not on file  . Stress: Not on file  Relationships  . Social connections:    Talks on phone: Not on file    Gets together: Not on file    Attends religious service: Not on file    Active member of club or organization: Not on file    Attends meetings of clubs or organizations: Not on file    Relationship status: Not on file  . Intimate partner violence:    Fear of current or ex partner: Not on file    Emotionally abused: Not on file    Physically abused: Not on file    Forced sexual activity: Not on file  Other Topics Concern  . Not on file  Social History Narrative   Original from [place], moved to [place] in 1994    2 daughters   [place]     Observations/Objective:   Height 5\' 2"  (1.575 m), weight 141 lb (64 kg), last menstrual period 07/15/2012. No acute distress.  Alert and oriented.  Speech fluent and not dysarthric.  Language intact.  Eyes orthophoric on primary gaze.  Face symmetric.  Assessment and  Plan:   1.  Right sided facial and cranial numbness and paresthesias, involving multiple dermatomal distributions (trigeminal nerve as well as cervical radic).  Prior brain MRIs from 2012 demonstrated a single hyperintense focus which is nonspecific.  I think it is reasonable to repeat MRI of brain to evaluate for any changes that more likely may suggest demyelinating disease.  As this is a similar event to 2012, also consider an atypical migraine with prolonged aura.  She does have occasional headaches but nothing significant that was associated with this event.  1.  MRI of brain with and without contrast.  Further recommendations pending results. 2.  Follow up after  testing.  Follow Up Instructions:    -I discussed the assessment and treatment plan with the patient. The patient was provided an opportunity to ask questions and all were answered. The patient agreed with the plan and demonstrated an understanding of the instructions.   The patient was advised to call back or seek an in-person evaluation if the symptoms worsen or if the condition fails to improve as anticipated.  Dudley Major, DO

## 2018-11-23 ENCOUNTER — Encounter: Payer: Self-pay | Admitting: Neurology

## 2018-11-23 ENCOUNTER — Other Ambulatory Visit: Payer: Self-pay

## 2018-11-23 ENCOUNTER — Telehealth (INDEPENDENT_AMBULATORY_CARE_PROVIDER_SITE_OTHER): Payer: BLUE CROSS/BLUE SHIELD | Admitting: Neurology

## 2018-11-23 VITALS — Ht 62.0 in | Wt 141.0 lb

## 2018-11-23 DIAGNOSIS — R200 Anesthesia of skin: Secondary | ICD-10-CM

## 2018-11-23 NOTE — Addendum Note (Signed)
Addended by: Clois Comber on: 11/23/2018 09:43 AM   Modules accepted: Orders

## 2018-12-07 NOTE — Progress Notes (Signed)
Office Visit Note  Patient: Karen Case             Date of Birth: June 20, 1966           MRN: [id number]             PCP: Colon Branch, MD Referring: Colon Branch, MD Visit Date: 12/21/2018 Occupation: School supply management  Subjective:  Pain in multiple joints.   History of Present Illness: Karen Case is a 53 y.o. female seen in consultation per request of her PCP.  According to patient in August 2019 she fell and hurt her left thumb which was painful.  At that time she was referred to [facility] office by Dr. Larose Kells.  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  She was also given a cortisone injection.  She states that injection initially helped and then the symptoms came back.  She is been having increased pain in bilateral hands which involves multiple digits now.  She has difficulty opening her hand and making a fist.  She states she also has discomfort in her bilateral wrist.  She has been having pain in her left knee joint and right shoulder joint intermittently.  Activities of Daily Living:  Patient reports morning stiffness for 0 minute.   Patient Reports nocturnal pain.  Difficulty dressing/grooming: Denies Difficulty climbing stairs: Reports Difficulty getting out of chair: Denies Difficulty using hands for taps, buttons, cutlery, and/or writing: Denies  Review of Systems  Constitutional: Positive for fatigue. Negative for night sweats, weight gain and weight loss.  HENT: Negative for mouth sores, trouble swallowing, trouble swallowing, mouth dryness and nose dryness.   Eyes: Negative for pain, redness, visual disturbance and dryness.  Respiratory: Negative for cough, shortness of breath, wheezing and difficulty breathing.   Cardiovascular: Negative for chest pain, palpitations, hypertension, irregular heartbeat and swelling in legs/feet.  Gastrointestinal: Negative for abdominal pain, blood in stool, constipation and diarrhea.  Endocrine:  Negative for excessive thirst and increased urination.  Genitourinary: Negative for difficulty urinating and vaginal dryness.  Musculoskeletal: Positive for arthralgias, joint pain, joint swelling and muscle tenderness. Negative for myalgias, muscle weakness, morning stiffness and myalgias.  Skin: Negative for color change, rash, hair loss, redness, skin tightness, ulcers and sensitivity to sunlight.  Allergic/Immunologic: Negative for susceptible to infections.  Neurological: Negative for dizziness, headaches, memory loss, night sweats and weakness.  Hematological: Negative for bruising/bleeding tendency and swollen glands.  Psychiatric/Behavioral: Positive for sleep disturbance. Negative for depressed mood. The patient is not nervous/anxious.     PMFS History:  Patient Active Problem List   Diagnosis Date Noted   Left knee pain 10/02/2017   PCP NOTES >>>>> 03/28/2015   Lumbalgia 08/22/2014   Annual physical exam 06/30/2011   Chronic constipation 06/30/2011   Neck pain 06/30/2011    Past Medical History:  Diagnosis Date   Birth control    husband's vasectomy   Chronic constipation 06/30/2011   GERD (gastroesophageal reflux disease)     Family History  Problem Relation Age of Onset   Hypertension Mother        M   Heart attack Father    Hyperlipidemia Neg Hx    Coronary artery disease Neg Hx    Stroke Neg Hx    Colon cancer Neg Hx    Breast cancer Neg Hx    Esophageal cancer Neg Hx    Liver cancer Neg Hx    Pancreatic cancer Neg Hx  Prostate cancer Neg Hx    Rectal cancer Neg Hx    Stomach cancer Neg Hx    Past Surgical History:  Procedure Laterality Date   BIOPSY TEAR [facility] Right 2012   Right lacrimal gland biopsy, chronic inflammation, no malignancy   BUNIONECTOMY  2000   Right   Social History   Social History Narrative   Original from [place], moved to [place] in 1994    2 daughters   [place]       Patient is  right-handed. She lives with her husband in a 2 level home. She drinks one large mug of coffee a day and sometimes tea. She walks her dog, and before COVID restrictions, she went to the gym 2-3 x a week for Zumba.   Immunization History  Administered Date(s) Administered   Influenza Split 06/30/2011   Influenza,inj,Quad PF,6+ Mos 04/02/2015   Influenza-Unspecified 04/11/2018   Tdap 06/30/2011     Objective: Vital Signs: BP 110/69 (BP Location: Right Arm, Patient Position: Sitting, Cuff Size: Normal)    Pulse 71    Resp 11    Ht 5' 4"  (1.626 m)    Wt 136 lb (61.7 kg)    LMP 07/15/2012    BMI 23.34 kg/m    Physical Exam Vitals signs and nursing note reviewed.  Constitutional:      Appearance: She is well-developed.  HENT:     Head: Normocephalic and atraumatic.  Eyes:     Conjunctiva/sclera: Conjunctivae normal.  Neck:     Musculoskeletal: Normal range of motion.  Cardiovascular:     Rate and Rhythm: Normal rate and regular rhythm.     Heart sounds: Normal heart sounds.  Pulmonary:     Effort: Pulmonary effort is normal.     Breath sounds: Normal breath sounds.  Abdominal:     General: Bowel sounds are normal.     Palpations: Abdomen is soft.  Lymphadenopathy:     Cervical: No cervical adenopathy.  Skin:    General: Skin is warm and dry.     Capillary Refill: Capillary refill takes less than 2 seconds.  Neurological:     Mental Status: She is alert and oriented to person, place, and time.  Psychiatric:        Behavior: Behavior normal.      Musculoskeletal Exam: C-spine thoracic and lumbar spine good range of motion.  Shoulder joints elbow joints wrist joints with good range of motion.  She has some tenderness over bilateral CMC's, MCPs and PIPs as described below.  No obvious synovitis was noted.  She has discomfort range of motion of her left knee joint.  No warmth was noted.  None of the other joints are painful.  CDAI Exam: CDAI Score: 8.9  Patient Global: 5 mm;  Provider Global: 4 mm Swollen: 0 ; Tender: 9  Joint Exam      Right  Left  Glenohumeral   Tender     CMC   Tender     MCP 1   Tender   Tender  MCP 4   Tender     IP      Tender  PIP 3   Tender   Tender  Knee      Tender     Investigation: Findings:  07/24/18: Sed rate 1, CK 45, B burg <0.90  Component     Latest Ref Rng & Units 07/24/2018  Sed Rate     0 - 30  mm/hr 1  CK Total     7 - 177 U/L 45  B burgdorferi Ab IgG+IgM     index <0.90   Imaging: Mr Jeri Cos Wo Contrast  Result Date: 12/08/2018 CLINICAL DATA:  Right facial numbness and headache. Prior abnormal MRI. EXAM: MRI HEAD WITHOUT AND WITH CONTRAST TECHNIQUE: Multiplanar, multiecho pulse sequences of the brain and surrounding structures were obtained without and with intravenous contrast. CONTRAST:  42m MULTIHANCE GADOBENATE DIMEGLUMINE 529 MG/ML IV SOLN COMPARISON:  Head CT 10/27/2018 and MRI 02/09/2011 FINDINGS: Brain: There is no evidence of acute infarct, intracranial hemorrhage, mass, midline shift, or extra-axial fluid collection. The ventricles and sulci are normal. A 6 mm focus of T2/FLAIR hyperintensity at the anterior aspect of the anterior limb of the left internal capsule is unchanged from the prior MRI. No other focal brain parenchymal signal abnormality is identified. No abnormal brain parenchymal or meningeal enhancement is seen. Vascular: Major intracranial vascular flow voids are preserved. Skull and upper cervical spine: Unremarkable bone marrow signal. Sinuses/Orbits: Unremarkable orbits. Paranasal sinuses and mastoid air cells are clear. Other: None. IMPRESSION: 1. No acute intracranial abnormality. 2. Unchanged subcentimeter T2 hyperintensity at the anterior aspect of the left internal capsule suggesting gliosis from a nonspecific remote insult. Otherwise unremarkable appearance of the brain without progressive findings to suggest demyelinating disease or other active process. Electronically Signed   By:  ALogan BoresM.D.   On: 12/08/2018 09:00   Xr Hand 2 View Left  Result Date: 12/21/2018 Minimal PIP and DIP narrowing was noted.  No MCP, intercarpal radiocarpal joint space narrowing was noted.  No erosive changes were noted. 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  Xr Hand 2 View Right  Result Date: 12/21/2018 Minimal PIP and DIP narrowing was noted.  No MCP, intercarpal radiocarpal joint space narrowing was noted.  No erosive changes were noted. 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  Xr Knee 3 View Left  Result Date: 12/21/2018 Moderate medial compartment narrowing was noted.  No chondrocalcinosis was noted.  Moderate patellofemoral narrowing was noted. 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   Recent Labs: Lab Results  Component Value Date   WBC 5.8 10/27/2018   HGB 13.4 10/27/2018   PLT 354 10/27/2018   NA 140 10/27/2018   K 3.9 10/27/2018   CL 105 10/27/2018   CO2 25 10/27/2018   GLUCOSE 104 (H) 10/27/2018   BUN 7 10/27/2018   CREATININE 0.60 10/27/2018   BILITOT 0.4 10/27/2018   ALKPHOS 91 10/27/2018   AST 22 10/27/2018   ALT 30 10/27/2018   PROT 7.3 10/27/2018   ALBUMIN 4.3 10/27/2018   CALCIUM 9.3 10/27/2018   GFRAA >60 10/27/2018    Speciality Comments: No specialty comments available.  Procedures:  No procedures performed Allergies: Patient has no known allergies.   Assessment / Plan:     Visit Diagnoses: Polyarthralgia -patient complains of pain in multiple joints including her right shoulder, bilateral hands and left knee joint.  She gives history of intermittent swelling in her hands.  07/24/18: Sed rate 1, CK 45, B burg <0.90   Pain in both hands -patient had tenderness over several of the MCPs and PIPs as described above.  She also had tenderness over bilateral CMC joints.  No synovitis was noted.  The x-ray  of the hands were consistent with osteoarthritis.  I will obtain following autoimmune labs today.  Plan: XR Hand 2 View Right, XR Hand 2 View Left, Sedimentation rate, Uric acid, Rheumatoid factor, ANA, Cyclic citrul peptide antibody, IgG, 14-3-3 eta Protein, HLA-B27 antigen  Chronic pain of left knee -she has pain in her left knee joint.  No warmth swelling or effusion was noted.  Plan: XR KNEE 3 VIEW LEFT, x-ray was consistent with moderate osteoarthritis of the knee joint.  Moderate chondromalacia was noted.  Chronic constipation -patient has been taking over-the-counter products.  Other fatigue -she has been experiencing increased fatigue in the last year.  Plan: CBC with Differential/Platelet, COMPLETE METABOLIC PANEL WITH GFR, CK, TSH, Glucose 6 phosphate dehydrogenase,   Orders: Orders Placed This Encounter  Procedures   XR Hand 2 View Right   XR Hand 2 View Left   XR KNEE 3 VIEW LEFT   CBC with Differential/Platelet   COMPLETE METABOLIC PANEL WITH GFR   CK   TSH   Sedimentation rate   Uric acid   Rheumatoid factor   ANA   Cyclic citrul peptide antibody, IgG   14-3-3 eta Protein   Glucose 6 phosphate dehydrogenase   HLA-B27 antigen   No orders of the defined types were placed in this encounter.   Face-to-face time spent with patient was 45 minutes. Greater than 50% of time was spent in counseling and coordination of care.  Follow-Up Instructions: Return for Pain in hands.   Bo Merino, MD  Note - This record has been created using Editor, commissioning.  Chart creation errors have been sought, but may not always  have been located. Such creation errors do not reflect on  the standard of medical care.

## 2018-12-08 ENCOUNTER — Other Ambulatory Visit: Payer: Self-pay

## 2018-12-08 ENCOUNTER — Ambulatory Visit
Admission: RE | Admit: 2018-12-08 | Discharge: 2018-12-08 | Disposition: A | Discharge status: Home / Self Care | Payer: BLUE CROSS/BLUE SHIELD | Source: Ambulatory Visit | Attending: Neurology | Admitting: Neurology

## 2018-12-08 DIAGNOSIS — R200 Anesthesia of skin: Secondary | ICD-10-CM

## 2018-12-08 DIAGNOSIS — R51 Headache: Secondary | ICD-10-CM | POA: Diagnosis not present

## 2018-12-08 MED ORDER — GADOBENATE DIMEGLUMINE 529 MG/ML IV SOLN
13.0000 mL | Freq: Once | INTRAVENOUS | Status: AC | PRN
  Administered 2018-12-08: 13 mL via INTRAVENOUS

## 2018-12-18 ENCOUNTER — Telehealth: Payer: Self-pay

## 2018-12-18 NOTE — Telephone Encounter (Signed)
Pt returned call, she requested I have an interpreter. I advised her I will call back with an interpreter. Called the interpreter line, spoke with [place] [phone]. Pt was advised of MRI results. She had appt to come into the office on 6/17, we changed to virtual visit.

## 2018-12-18 NOTE — Telephone Encounter (Signed)
Called spanish interpreter, Angie @ [phone]. She called Pt with me on the line. There was no answer. She left a message for Pt to return call for MRI results.

## 2018-12-18 NOTE — Telephone Encounter (Signed)
-----   Message from Pieter Partridge, DO sent at 12/15/2018  8:22 AM EDT ----- MRI of brain looks unchanged compared to her prior imaging.  Nothing to explain facial numbness.  It may be persistent symptoms from a migraine.  I would like her to follow up (may be e-visit if she desires) to discuss further.

## 2018-12-21 ENCOUNTER — Ambulatory Visit (INDEPENDENT_AMBULATORY_CARE_PROVIDER_SITE_OTHER): Payer: BC Managed Care – PPO | Admitting: Rheumatology

## 2018-12-21 ENCOUNTER — Ambulatory Visit (INDEPENDENT_AMBULATORY_CARE_PROVIDER_SITE_OTHER): Payer: BC Managed Care – PPO

## 2018-12-21 ENCOUNTER — Encounter: Payer: Self-pay | Admitting: Rheumatology

## 2018-12-21 ENCOUNTER — Ambulatory Visit: Payer: Self-pay

## 2018-12-21 ENCOUNTER — Other Ambulatory Visit: Payer: Self-pay

## 2018-12-21 VITALS — BP 110/69 | HR 71 | Resp 11 | Ht 64.0 in | Wt 136.0 lb

## 2018-12-21 DIAGNOSIS — M79641 Pain in right hand: Secondary | ICD-10-CM

## 2018-12-21 DIAGNOSIS — M2550 Pain in unspecified joint: Secondary | ICD-10-CM

## 2018-12-21 DIAGNOSIS — G8929 Other chronic pain: Secondary | ICD-10-CM | POA: Diagnosis not present

## 2018-12-21 DIAGNOSIS — M79642 Pain in left hand: Secondary | ICD-10-CM

## 2018-12-21 DIAGNOSIS — M25562 Pain in left knee: Secondary | ICD-10-CM | POA: Diagnosis not present

## 2018-12-21 DIAGNOSIS — K5909 Other constipation: Secondary | ICD-10-CM | POA: Diagnosis not present

## 2018-12-21 DIAGNOSIS — R5383 Other fatigue: Secondary | ICD-10-CM

## 2018-12-26 ENCOUNTER — Encounter: Payer: Self-pay | Admitting: Neurology

## 2018-12-26 NOTE — Progress Notes (Signed)
Virtual Visit via Video Note The purpose of this virtual visit is to provide medical care while limiting exposure to the novel coronavirus.    Consent was obtained for video visit:  Yes.   Answered questions that patient had about telehealth interaction:  Yes.   I discussed the limitations, risks, security and privacy concerns of performing an evaluation and management service by telemedicine. I also discussed with the patient that there may be a patient responsible charge related to this service. The patient expressed understanding and agreed to proceed.  Pt location: Home Physician Location: Home Name of referring provider:  Colon Branch, MD I connected with [name] at patients initiation/request on 12/27/2018 at  8:50 AM EDT by video enabled telemedicine application and verified that I am speaking with the correct person using two identifiers. Pt MRN:  [id number] Pt DOB:  [birth year] Video Participants:  [name]   History of Present Illness:  Karen Case is a 53 year old woman who presents for paresthesias.  History supplemented by ED and referring provider note.  UPDATE: Repeat MRI of brain with and without contrast from 12/08/18 was personally reviewed and again demonstrated stable subcentimeter T2 hyperintensity anterior aspect of the left internal capsule with no changes since prior MRI.  Finding suggests gliosis from a nonspecific remote insult.  Numbness improved but still persists.  She had a severe headache last Saturday that responded to Motrin.  Other than that, no recurrent headaches.    She was evaluated by rheumatology for polyarthralgias.  Labs including ANA, Sed Rate, RF, CK, cyclic citrullin peptide antibody and TSH unremarkable.  Of note, Lyme antibody from January was negative.  HISTORY: In 2012, she developed swelling in the right eye and her eyelid was shut.  Eye exam was unremarkable but she noted that the entire right side of her head and face  was numb from the back of her head and involving the V1-V3 distribution.  MRI of brain with and without contrast from 11/09/10 was personally reviewed and demonstrated single ovoid T2 and FLAIR hyperintensity along the putamen and anterior limb of the left internal capsule without abnormal enhancement.  She had a repeat MRI of brain with and without contrast on 02/09/11 which again demonstrated the non-enhancing 6.2 mm hyperintense lesion, unchanged.  Symptoms lasted about 2 months and resolved.  She went to the ED on 10/27/18 for a similar event.  They history she provides me is different than what is reported in the ED note.  The ED note states she that several days prior to visit, she developed a diffuse headache followed by onset of right sided facial and head numbness.  The patient reports to me that she developed right sided numbness and tingling of the face and head in February and it never resolved.  She also denied associated headache.  Objective neurologic exam reportedly normal.  Labs including CBC and CMP were unremarkable.  CT of head was personally reviewed and demonstrated subtle hypodense focus anterior to the left putamen, corresponding to the prior lesion on MRI, but no acute abnormality.  She continued to have the numbness and paresthesias, unchanged.  There is no associated facial weakness, visual disturbance, gait abnormality or numbness or weakness of her extremities.  She did have a severe pounding headache last night, not associated with nausea, vomiting, photophobia or phonophobia, but that has since resolved.  She reports history of similar headaches once in awhile.  She has some occasional neck soreness but  denies any significant neck pain or radicular pain down the arms.  She denies family history of neurologic diseases.  Past Medical History: Past Medical History:  Diagnosis Date  . Birth control    husband's vasectomy  . Chronic constipation 06/30/2011  . GERD  (gastroesophageal reflux disease)     Medications: Outpatient Encounter Medications as of 12/27/2018  Medication Sig  . cholecalciferol (VITAMIN D3) 25 MCG (1000 UT) tablet Take 1,000 Units by mouth daily.  Marland Kitchen ibuprofen (ADVIL) 400 MG tablet Take 400 mg by mouth every 8 (eight) hours as needed.  Marland Kitchen ketoconazole (NIZORAL) 2 % cream Apply 1 application topically daily.  . naproxen sodium (ALEVE) 220 MG tablet Take 220 mg by mouth.   No facility-administered encounter medications on file as of 12/27/2018.     Allergies: No Known Allergies  Family History: Family History  Problem Relation Age of Onset  . Hypertension Mother        M  . Heart attack Father   . Hyperlipidemia Neg Hx   . Coronary artery disease Neg Hx   . Stroke Neg Hx   . Colon cancer Neg Hx   . Breast cancer Neg Hx   . Esophageal cancer Neg Hx   . Liver cancer Neg Hx   . Pancreatic cancer Neg Hx   . Prostate cancer Neg Hx   . Rectal cancer Neg Hx   . Stomach cancer Neg Hx     Social History: Social History   Socioeconomic History  . Marital status: Married    Spouse name: Francee Piccolo  . Number of children: 2  . Years of education: Not on file  . Highest education level: Some college, no degree  Occupational History  . Occupation: part time job  Social Needs  . Financial resource strain: Not on file  . Food insecurity    Worry: Not on file    Inability: Not on file  . Transportation needs    Medical: Not on file    Non-medical: Not on file  Tobacco Use  . Smoking status: Never Smoker  . Smokeless tobacco: Never Used  Substance and Sexual Activity  . Alcohol use: Yes    Alcohol/week: 0.0 standard drinks    Comment: socially   . Drug use: No  . Sexual activity: Not on file  Lifestyle  . Physical activity    Days per week: Not on file    Minutes per session: Not on file  . Stress: Not on file  Relationships  . Social Herbalist on phone: Not on file    Gets together: Not on file     Attends religious service: Not on file    Active member of club or organization: Not on file    Attends meetings of clubs or organizations: Not on file    Relationship status: Not on file  . Intimate partner violence    Fear of current or ex partner: Not on file    Emotionally abused: Not on file    Physically abused: Not on file    Forced sexual activity: Not on file  Other Topics Concern  . Not on file  Social History Narrative   Original from [place], moved to [place] in 1994    2 daughters   [place]       Patient is right-handed. She lives with her husband in a 2 level home. She drinks one large mug of coffee a day  and sometimes tea. She walks her dog, and before COVID restrictions, she went to the gym 2-3 x a week for Zumba.   Observations/Objective:   Height 5\' 4"  (1.626 m), weight 143 lb (64.9 kg), last menstrual period 07/15/2012. No acute distress.  Alert and oriented.  Speech fluent and not dysarthric.  Language intact.  Face symmetric.    Assessment and Plan:   1.  Right sided facial and cranial numbness and paresthesias, involving multiple dermatomal distributions (trigeminal nerve as well as cervical radic).  Prior brain MRIs from 2012 demonstrated a single hyperintense focus which is nonspecific.  Repeat MRI of brain with and without contrast is unchanged.  Recent rheumatologic labs and Lyme unremarkable.  As this is a similar event to 2012, also consider an atypical migraine with prolonged aura.  She does have occasional headaches but nothing significant that was associated with this event.  1.  We will check ACE level as another possible cause for a cranial neuropathy.  If unremarkable, then I have no further recommendations other than she may seek a second opinion.  Follow Up Instructions:    -I discussed the assessment and treatment plan with the patient. The patient was provided an opportunity to ask questions and all were answered. The patient  agreed with the plan and demonstrated an understanding of the instructions.   The patient was advised to call back or seek an in-person evaluation if the symptoms worsen or if the condition fails to improve as anticipated.    Total Time spent in visit with the patient was:  10 minutes   Dudley Major, DO

## 2018-12-27 ENCOUNTER — Encounter: Payer: Self-pay | Admitting: Neurology

## 2018-12-27 ENCOUNTER — Other Ambulatory Visit: Payer: Self-pay

## 2018-12-27 ENCOUNTER — Telehealth (INDEPENDENT_AMBULATORY_CARE_PROVIDER_SITE_OTHER): Payer: BC Managed Care – PPO | Admitting: Neurology

## 2018-12-27 VITALS — Ht 64.0 in | Wt 143.0 lb

## 2018-12-27 DIAGNOSIS — R200 Anesthesia of skin: Secondary | ICD-10-CM

## 2018-12-27 NOTE — Addendum Note (Signed)
Addended by: Clois Comber on: 12/27/2018 09:14 AM   Modules accepted: Orders

## 2018-12-28 LAB — COMPLETE METABOLIC PANEL WITH GFR
AG Ratio: 2 (calc) (ref 1.0–2.5)
ALT: 20 U/L (ref 6–29)
AST: 17 U/L (ref 10–35)
Albumin: 4.1 g/dL (ref 3.6–5.1)
Alkaline phosphatase (APISO): 79 U/L (ref 37–153)
BUN: 18 mg/dL (ref 7–25)
CO2: 28 mmol/L (ref 20–32)
Calcium: 9 mg/dL (ref 8.6–10.4)
Chloride: 105 mmol/L (ref 98–110)
Creat: 0.66 mg/dL (ref 0.50–1.05)
GFR, Est African American: 118 mL/min/{1.73_m2} (ref >=60)
GFR, Est Non African American: 102 mL/min/{1.73_m2} (ref >=60)
Globulin: 2.1 g/dL (calc) (ref 1.9–3.7)
Glucose, Bld: 97 mg/dL (ref 65–99)
Potassium: 4.2 mmol/L (ref 3.5–5.3)
Sodium: 140 mmol/L (ref 135–146)
Total Bilirubin: 0.7 mg/dL (ref 0.2–1.2)
Total Protein: 6.2 g/dL (ref 6.1–8.1)

## 2018-12-28 LAB — CBC WITH DIFFERENTIAL/PLATELET
Absolute Monocytes: 350 cells/uL (ref 200–950)
Basophils Absolute: 51 cells/uL (ref 0–200)
Basophils Relative: 1.1 %
Eosinophils Absolute: 529 cells/uL — ABNORMAL HIGH (ref 15–500)
Eosinophils Relative: 11.5 %
HCT: 37.6 % (ref 35.0–45.0)
Hemoglobin: 12.5 g/dL (ref 11.7–15.5)
Lymphs Abs: 1196 cells/uL (ref 850–3900)
MCH: 32.8 pg (ref 27.0–33.0)
MCHC: 33.2 g/dL (ref 32.0–36.0)
MCV: 98.7 fL (ref 80.0–100.0)
MPV: 9.6 fL (ref 7.5–12.5)
Monocytes Relative: 7.6 %
Neutro Abs: 2475 cells/uL (ref 1500–7800)
Neutrophils Relative %: 53.8 %
Platelets: 325 10*3/uL (ref 140–400)
RBC: 3.81 10*6/uL (ref 3.80–5.10)
RDW: 11.7 % (ref 11.0–15.0)
Total Lymphocyte: 26 %
WBC: 4.6 10*3/uL (ref 3.8–10.8)

## 2018-12-28 LAB — RHEUMATOID FACTOR: Rheumatoid fact SerPl-aCnc: <14 IU/mL (ref <14)

## 2018-12-28 LAB — 14-3-3 ETA PROTEIN: 14-3-3 eta Protein: <0.2 ng/mL (ref <0.2)

## 2018-12-28 LAB — GLUCOSE 6 PHOSPHATE DEHYDROGENASE: G-6PDH: 7.9 U/g Hgb (ref 7.0–20.5)

## 2018-12-28 LAB — SEDIMENTATION RATE: Sed Rate: 2 mm/h (ref 0–30)

## 2018-12-28 LAB — URIC ACID: Uric Acid, Serum: 3.8 mg/dL (ref 2.5–7.0)

## 2018-12-28 LAB — CYCLIC CITRUL PEPTIDE ANTIBODY, IGG: Cyclic Citrullin Peptide Ab: <16 UNITS

## 2018-12-28 LAB — TSH: TSH: 1.14 mIU/L

## 2018-12-28 LAB — CK: Total CK: 67 U/L (ref 29–143)

## 2018-12-28 LAB — ANA: Anti Nuclear Antibody (ANA): NEGATIVE

## 2018-12-28 LAB — HLA-B27 ANTIGEN: HLA-B27 Antigen: NEGATIVE

## 2018-12-29 NOTE — Progress Notes (Deleted)
Office Visit Note  Patient: Karen Case             Date of Birth: 10-Dec-1965           MRN: [id number]             PCP: Colon Branch, MD Referring: Colon Branch, MD Visit Date: 01/11/2019 Occupation: _0 @  Subjective:  No chief complaint on file.   History of Present Illness: Karen Case is a 53 y.o. female ***   Activities of Daily Living:  Patient reports morning stiffness for *** {minute/hour:19697}.   Patient {ACTIONS;DENIES/REPORTS:21021675::"Denies"} nocturnal pain.  Difficulty dressing/grooming: {ACTIONS;DENIES/REPORTS:21021675::"Denies"} Difficulty climbing stairs: {ACTIONS;DENIES/REPORTS:21021675::"Denies"} Difficulty getting out of chair: {ACTIONS;DENIES/REPORTS:21021675::"Denies"} Difficulty using hands for taps, buttons, cutlery, and/or writing: {ACTIONS;DENIES/REPORTS:21021675::"Denies"}  No Rheumatology ROS completed.   PMFS History:  Patient Active Problem List   Diagnosis Date Noted  . Left knee pain 10/02/2017  . PCP NOTES >>>>> 03/28/2015  . Lumbalgia 08/22/2014  . Annual physical exam 06/30/2011  . Chronic constipation 06/30/2011  . Neck pain 06/30/2011    Past Medical History:  Diagnosis Date  . Birth control    husband's vasectomy  . Chronic constipation 06/30/2011  . GERD (gastroesophageal reflux disease)     Family History  Problem Relation Age of Onset  . Hypertension Mother        M  . Heart attack Father   . Hyperlipidemia Neg Hx   . Coronary artery disease Neg Hx   . Stroke Neg Hx   . Colon cancer Neg Hx   . Breast cancer Neg Hx   . Esophageal cancer Neg Hx   . Liver cancer Neg Hx   . Pancreatic cancer Neg Hx   . Prostate cancer Neg Hx   . Rectal cancer Neg Hx   . Stomach cancer Neg Hx    Past Surgical History:  Procedure Laterality Date  . BIOPSY TEAR [place] Right 2012   Right lacrimal gland biopsy, chronic inflammation, no malignancy  . BUNIONECTOMY  2000   Right   Social History   Social History Narrative    Original from [place], moved to [place] in 1994    2 daughters   [place]       Patient is right-handed. She lives with her husband in a 2 level home. She drinks one large mug of coffee a day and sometimes tea. She walks her dog, and before COVID restrictions, she went to the gym 2-3 x a week for Zumba.   Immunization History  Administered Date(s) Administered  . Influenza Split 06/30/2011  . Influenza,inj,Quad PF,6+ Mos 04/02/2015  . Influenza-Unspecified 04/11/2018  . Tdap 06/30/2011     Objective: Vital Signs: LMP 07/15/2012    Physical Exam   Musculoskeletal Exam: ***  CDAI Exam: CDAI Score: - Patient Global: -; Provider Global: - Swollen: -; Tender: - Joint Exam   No joint exam has been documented for this visit   There is currently no information documented on the homunculus. Go to the Rheumatology activity and complete the homunculus joint exam.  Investigation: No additional findings.  Imaging: Mr Jeri Cos EN Contrast  Result Date: 12/08/2018 CLINICAL DATA:  Right facial numbness and headache. Prior abnormal MRI. EXAM: MRI HEAD WITHOUT AND WITH CONTRAST TECHNIQUE: Multiplanar, multiecho pulse sequences of the brain and surrounding structures were obtained without and with intravenous contrast. CONTRAST:  31m MULTIHANCE GADOBENATE DIMEGLUMINE 529 MG/ML IV SOLN COMPARISON:  Head CT 10/27/2018 and  MRI 02/09/2011 FINDINGS: Brain: There is no evidence of acute infarct, intracranial hemorrhage, mass, midline shift, or extra-axial fluid collection. The ventricles and sulci are normal. A 6 mm focus of T2/FLAIR hyperintensity at the anterior aspect of the anterior limb of the left internal capsule is unchanged from the prior MRI. No other focal brain parenchymal signal abnormality is identified. No abnormal brain parenchymal or meningeal enhancement is seen. Vascular: Major intracranial vascular flow voids are preserved. Skull and upper cervical spine:  Unremarkable bone marrow signal. Sinuses/Orbits: Unremarkable orbits. Paranasal sinuses and mastoid air cells are clear. Other: None. IMPRESSION: 1. No acute intracranial abnormality. 2. Unchanged subcentimeter T2 hyperintensity at the anterior aspect of the left internal capsule suggesting gliosis from a nonspecific remote insult. Otherwise unremarkable appearance of the brain without progressive findings to suggest demyelinating disease or other active process. Electronically Signed   By: Logan Bores M.D.   On: 12/08/2018 09:00   Xr Hand 2 View Left  Result Date: 12/21/2018 Minimal PIP and DIP narrowing was noted.  No MCP, intercarpal radiocarpal joint space narrowing was noted.  No erosive changes were noted. 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  Xr Hand 2 View Right  Result Date: 12/21/2018 Minimal PIP and DIP narrowing was noted.  No MCP, intercarpal radiocarpal joint space narrowing was noted.  No erosive changes were noted. 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  Xr Knee 3 View Left  Result Date: 12/21/2018 Moderate medial compartment narrowing was noted.  No chondrocalcinosis was noted.  Moderate patellofemoral narrowing was noted. 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   Recent Labs: Lab Results  Component Value Date   WBC 4.6 12/21/2018   HGB 12.5 12/21/2018   PLT 325 12/21/2018   NA 140 12/21/2018   K 4.2 12/21/2018   CL 105 12/21/2018   CO2 28 12/21/2018   GLUCOSE 97 12/21/2018   BUN 18 12/21/2018   CREATININE 0.66 12/21/2018   BILITOT 0.7 12/21/2018   ALKPHOS 91 10/27/2018   AST 17 12/21/2018   ALT 20 12/21/2018   PROT 6.2 12/21/2018   ALBUMIN 4.3 10/27/2018   CALCIUM 9.0 12/21/2018   GFRAA 118 12/21/2018  December 21, 2018 TSH normal, CK normal, ESR 2, uric acid 3.8, RF negative, anti-CCP negative, _0 eta negative,  ANA negative, HLA-B27 negative, G6PD normal  Speciality Comments: No specialty comments available.  Procedures:  No procedures performed Allergies: Patient has no known allergies.   Assessment / Plan:     Visit Diagnoses: No diagnosis found.   Orders: No orders of the defined types were placed in this encounter.  No orders of the defined types were placed in this encounter.   Face-to-face time spent with patient was *** minutes. Greater than 50% of time was spent in counseling and coordination of care.  Follow-Up Instructions: No follow-ups on file.   Bo Merino, MD  Note - This record has been created using Editor, commissioning.  Chart creation errors have been sought, but may not always  have been located. Such creation errors do not reflect on  the standard of medical care.

## 2018-12-29 NOTE — Progress Notes (Signed)
WNLs . We will discuss at the fu visit.

## 2019-01-01 ENCOUNTER — Other Ambulatory Visit (INDEPENDENT_AMBULATORY_CARE_PROVIDER_SITE_OTHER): Payer: BC Managed Care – PPO

## 2019-01-01 ENCOUNTER — Other Ambulatory Visit: Payer: Self-pay

## 2019-01-01 DIAGNOSIS — R200 Anesthesia of skin: Secondary | ICD-10-CM | POA: Diagnosis not present

## 2019-01-03 LAB — ANGIOTENSIN CONVERTING ENZYME: Angiotensin-Converting Enzyme: 9 U/L (ref 9–67)

## 2019-01-11 ENCOUNTER — Ambulatory Visit: Payer: BC Managed Care – PPO | Admitting: Rheumatology

## 2019-01-17 ENCOUNTER — Ambulatory Visit: Payer: BLUE CROSS/BLUE SHIELD | Admitting: Rheumatology

## 2019-03-02 ENCOUNTER — Ambulatory Visit: Payer: BC Managed Care – PPO | Admitting: Rheumatology

## 2019-04-05 ENCOUNTER — Ambulatory Visit: Payer: BLUE CROSS/BLUE SHIELD | Admitting: Internal Medicine

## 2019-04-06 ENCOUNTER — Telehealth: Payer: Self-pay | Admitting: Internal Medicine

## 2019-04-06 NOTE — Telephone Encounter (Signed)
Tried to call pt back for screening.  LMOVM for pt to please call back to complete screening if she gets this msg before 5 pm.   Copied from [place]. Topic: General - Other >> Apr 06, 2019  1:46 PM Burchel, Abbi R wrote: Reason for CRM: Pt called back to complete COVID screening.  Please call pt:   [phone]

## 2019-04-09 ENCOUNTER — Ambulatory Visit (INDEPENDENT_AMBULATORY_CARE_PROVIDER_SITE_OTHER): Payer: BC Managed Care – PPO | Admitting: Internal Medicine

## 2019-04-09 ENCOUNTER — Encounter: Payer: Self-pay | Admitting: Internal Medicine

## 2019-04-09 ENCOUNTER — Other Ambulatory Visit: Payer: Self-pay

## 2019-04-09 VITALS — BP 115/71 | HR 82 | Temp 97.3°F | Resp 16 | Ht 64.0 in | Wt 136.2 lb

## 2019-04-09 DIAGNOSIS — R202 Paresthesia of skin: Secondary | ICD-10-CM

## 2019-04-09 DIAGNOSIS — M542 Cervicalgia: Secondary | ICD-10-CM | POA: Diagnosis not present

## 2019-04-09 DIAGNOSIS — M2550 Pain in unspecified joint: Secondary | ICD-10-CM

## 2019-04-09 MED ORDER — PREDNISONE 10 MG PO TABS: ORAL_TABLET | ORAL | 0 refills | Status: DC

## 2019-04-09 NOTE — Progress Notes (Signed)
Subjective:    Patient ID: Karen Case, female    DOB: 1965/08/26, 53 y.o.   MRN: [id number]  DOS:  04/09/2019 Type of visit - description: Follow-up Notes from neurology reviewed Notes from rheumatology reviewed Has developed right-sided neck pain with some radiation to the trapezial area. Denies new paresthesias.    Review of Systems No fever chills. No cough, chest pain or difficulty breathing  Past Medical History:  Diagnosis Date  . Birth control    husband's vasectomy  . Chronic constipation 06/30/2011  . GERD (gastroesophageal reflux disease)     Past Surgical History:  Procedure Laterality Date  . BIOPSY TEAR [place] Right 2012   Right lacrimal gland biopsy, chronic inflammation, no malignancy  . BUNIONECTOMY  2000   Right    Social History   Socioeconomic History  . Marital status: Married    Spouse name: Francee Piccolo  . Number of children: 2  . Years of education: Not on file  . Highest education level: Some college, no degree  Occupational History  . Occupation: part time job  Social Needs  . Financial resource strain: Not on file  . Food insecurity    Worry: Not on file    Inability: Not on file  . Transportation needs    Medical: Not on file    Non-medical: Not on file  Tobacco Use  . Smoking status: Never Smoker  . Smokeless tobacco: Never Used  Substance and Sexual Activity  . Alcohol use: Yes    Alcohol/week: 0.0 standard drinks    Comment: socially   . Drug use: No  . Sexual activity: Not on file  Lifestyle  . Physical activity    Days per week: Not on file    Minutes per session: Not on file  . Stress: Not on file  Relationships  . Social Herbalist on phone: Not on file    Gets together: Not on file    Attends religious service: Not on file    Active member of club or organization: Not on file    Attends meetings of clubs or organizations: Not on file    Relationship status: Not on file  . Intimate partner violence   Fear of current or ex partner: Not on file    Emotionally abused: Not on file    Physically abused: Not on file    Forced sexual activity: Not on file  Other Topics Concern  . Not on file  Social History Narrative   Original from [place], moved to [place] in 1994    2 daughters   [place]       Patient is right-handed. She lives with her husband in a 2 level home. She drinks one large mug of coffee a day and sometimes tea. She walks her dog, and before COVID restrictions, she went to the gym 2-3 x a week for Zumba.      Allergies as of 04/09/2019   No Known Allergies     Medication List       Accurate as of April 09, 2019  8:06 AM. If you have any questions, ask your nurse or doctor.        cholecalciferol 25 MCG (1000 UT) tablet Commonly known as: VITAMIN D3 Take 1,000 Units by mouth daily.   ibuprofen 400 MG tablet Commonly known as: ADVIL Take 400 mg by mouth every 8 (eight) hours as needed.   ketoconazole 2 %  cream Commonly known as: NIZORAL Apply 1 application topically daily.   naproxen sodium 220 MG tablet Commonly known as: ALEVE Take 220 mg by mouth.           Objective:   Physical Exam BP 115/71 (BP Location: Left Arm, Patient Position: Sitting, Cuff Size: Small)   Pulse 82   Temp (!) 97.3 F (36.3 C) (Temporal)   Resp 16   Ht 5\' 4"  (1.626 m)   Wt 136 lb 4 oz (61.8 kg)   LMP 07/15/2012   SpO2 100%   BMI 23.39 kg/m  General:   Well developed, NAD, BMI noted. HEENT: Neck: No TTP of the cervical spine, range of motion normal. MSK: Hands without synovitis Skin: Not pale. Not jaundice Neurologic:  alert & oriented X3.  Speech normal, gait appropriate for age and unassisted.  Motor and DTRs symmetric Psych--  Cognition and judgment appear intact.  Cooperative with normal attention span and concentration.  Behavior appropriate. No anxious or depressed appearing.      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  Recommend a round of prednisone, warm compresses, Tylenol/ibuprofen with GI precautions.  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  Angiotensin-converting enzyme was checked: within normal Polyarthralgia: Saw rheumatology, extensive blood work negative.  X-rays hands, left knee normal . still have on and off symptoms, recommend to see rheumatology on f/u to discuss labs, most likely this will be nonspecific sxs. Injury, left middle finger, to see orthopedics tomorrow Flu shot in few days after she finish prednisone RTC 6 months CPX

## 2019-04-09 NOTE — Patient Instructions (Addendum)
GO TO THE FRONT DESK Schedule your next appointment  for a physical in 6 months   Warm compress  Prednisone as prescribed  Tylenol  500 mg OTC 2 tabs a day every 8 hours as needed for pain  IBUPROFEN (Advil or Motrin) 200 mg 2 tablets every 8 hours as needed for pain for for days .  Always take it with food because may cause gastritis and ulcers.  If you notice nausea, stomach pain, change in the color of stools --->  Stop the medicine and let us know

## 2019-04-09 NOTE — Progress Notes (Signed)
Pre visit review using our clinic review tool, if applicable. No additional management support is needed unless otherwise documented below in the visit note. 

## 2019-04-09 NOTE — Assessment & Plan Note (Signed)
Right-sided neck pain: New issue, started 3 days ago, probably a sprain, has failed OTCs.  Recommend a round of prednisone, warm compresses, Tylenol/ibuprofen with GI precautions.  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  Angiotensin-converting enzyme was checked: within normal Polyarthralgia: Saw rheumatology, extensive blood work negative.  X-rays hands, left knee normal . still have on and off symptoms, recommend to see rheumatology on f/u to discuss labs, most likely this will be nonspecific sxs. Injury, left middle finger, to see orthopedics tomorrow Flu shot in few days after she finish prednisone RTC 6 months CPX

## 2019-04-10 DIAGNOSIS — M79642 Pain in left hand: Secondary | ICD-10-CM | POA: Diagnosis not present

## 2019-04-10 DIAGNOSIS — M1812 Unilateral primary osteoarthritis of first carpometacarpal joint, left hand: Secondary | ICD-10-CM | POA: Diagnosis not present

## 2019-04-10 DIAGNOSIS — M65332 Trigger finger, left middle finger: Secondary | ICD-10-CM | POA: Diagnosis not present

## 2019-04-20 ENCOUNTER — Other Ambulatory Visit: Payer: Self-pay | Admitting: Internal Medicine

## 2019-04-20 DIAGNOSIS — Z1231 Encounter for screening mammogram for malignant neoplasm of breast: Secondary | ICD-10-CM

## 2019-04-24 DIAGNOSIS — S20212A Contusion of left front wall of thorax, initial encounter: Secondary | ICD-10-CM | POA: Diagnosis not present

## 2019-05-08 DIAGNOSIS — M65332 Trigger finger, left middle finger: Secondary | ICD-10-CM | POA: Diagnosis not present

## 2019-05-08 DIAGNOSIS — M65352 Trigger finger, left little finger: Secondary | ICD-10-CM | POA: Diagnosis not present

## 2019-05-11 DIAGNOSIS — H6092 Unspecified otitis externa, left ear: Secondary | ICD-10-CM | POA: Diagnosis not present

## 2019-06-01 ENCOUNTER — Other Ambulatory Visit: Payer: Self-pay

## 2019-06-01 ENCOUNTER — Ambulatory Visit
Admission: RE | Admit: 2019-06-01 | Discharge: 2019-06-01 | Disposition: A | Discharge status: Home / Self Care | Payer: BC Managed Care – PPO | Source: Ambulatory Visit | Attending: Internal Medicine | Admitting: Internal Medicine

## 2019-06-01 DIAGNOSIS — Z1231 Encounter for screening mammogram for malignant neoplasm of breast: Secondary | ICD-10-CM

## 2019-06-05 DIAGNOSIS — M1812 Unilateral primary osteoarthritis of first carpometacarpal joint, left hand: Secondary | ICD-10-CM | POA: Diagnosis not present

## 2019-06-05 DIAGNOSIS — M79642 Pain in left hand: Secondary | ICD-10-CM | POA: Diagnosis not present

## 2019-06-05 DIAGNOSIS — M65352 Trigger finger, left little finger: Secondary | ICD-10-CM | POA: Diagnosis not present

## 2019-06-05 DIAGNOSIS — M65332 Trigger finger, left middle finger: Secondary | ICD-10-CM | POA: Diagnosis not present

## 2019-07-10 DIAGNOSIS — Z20828 Contact with and (suspected) exposure to other viral communicable diseases: Secondary | ICD-10-CM | POA: Diagnosis not present

## 2019-07-22 DIAGNOSIS — Z20828 Contact with and (suspected) exposure to other viral communicable diseases: Secondary | ICD-10-CM | POA: Diagnosis not present

## 2019-08-09 DIAGNOSIS — M25561 Pain in right knee: Secondary | ICD-10-CM | POA: Diagnosis not present

## 2019-08-13 ENCOUNTER — Ambulatory Visit (INDEPENDENT_AMBULATORY_CARE_PROVIDER_SITE_OTHER): Payer: BC Managed Care – PPO | Admitting: Family Medicine

## 2019-08-13 ENCOUNTER — Other Ambulatory Visit: Payer: Self-pay

## 2019-08-13 DIAGNOSIS — M25561 Pain in right knee: Secondary | ICD-10-CM | POA: Insufficient documentation

## 2019-08-13 MED ORDER — METHYLPREDNISOLONE ACETATE 40 MG/ML IJ SUSP
40.0000 mg | Freq: Once | INTRAMUSCULAR | Status: AC
  Administered 2019-08-13: 17:00:00 40 mg via INTRA_ARTICULAR

## 2019-08-13 NOTE — Assessment & Plan Note (Addendum)
Likely to be 2/2 OA given hx and exam. No concern for tear at this time. Patient tolerated steroid injeciton and has previously had benefit from this. Tylenol and aleve prn pain. Counseled on RICE therapy. Follow up in 1 month if pain continues. May consider physical therapy if pain continues.   A steroid injection was performed on lateral R knee using 1% plain Lidocaine and 30 mg of depo-medrol. This was well tolerated.

## 2019-08-13 NOTE — Progress Notes (Signed)
   HPI  CC: R knee pain  54 yo female who works in a [employer] and works all day. On Thursday she was doing good, but all of sudden started to have pain in right knee.  When she walks, feels as if her knee will turn out. She rested over the weekend and iced it and it has improved some. She was seen in the walk in clinic and given an NSAID at the clinic but it upset her stomach so she stopped it. She last took it yesterday. Tried aleve and tylenol as well which did not help.   Previously had cortisone injection but does not remember which knee - this helped quite a bit. No hx of surgery.   See HPI and/or previous note for associated ROS.  Objective: BP 120/68   Ht 5\' 2"  (1.575 m)   Wt 144 lb (65.3 kg)   LMP 07/15/2012   BMI 26.34 kg/m  Gen: NAD, well groomed, a/o x3, normal affect.  CV: Well-perfused. Warm.  Resp: Non-labored.  Neuro: Sensation intact throughout. No gross coordination deficits.  Gait: unremarkable stride without signs of limp or balance issues. R Knee:  Normal to inspection with no erythema. Small effusion, no obvious bony abnormalities. No obvious Baker's cyst Palpation normal with no warmth or patellar tenderness or condyle tenderness. TTP at lateral joint line. No TTP along infrapatellar or pes anserine bursas.   ROM normal in flexion (135 degrees) and extension (0 degrees) and lower leg rotation. Ligaments with solid consistent endpoints including ACL, PCL, LCL, MCL. Negative Anterior Drawer/Lachman Negative Mcmurray's and provocative meniscal tests including Thessaly and Apley compression testing  Non painful patellar compression.  Normal Patellar glide.  No apprehension  Patellar and quadriceps tendons unremarkable. Hamstring and quadriceps strength is normal. Neurovascularly intact B/L LE   Assessment and plan:  Right knee pain Likely to be 2/2 OA given hx and exam. No concern for tear at this time. Patient tolerated steroid injection for left knee and did  well - performed today on right knee as noted below. Tylenol and aleve prn pain. Counseled on RICE therapy. Follow up in 1 month if pain continues. May consider physical therapy if pain continues.   After informed written consent timeout was performed, patient was seated on exam table. Right knee was prepped with alcohol swab and utilizing anterolateral approach, patient's right knee was injected intraarticularly with 3:1 bupivicaine: depomedrol. Patient tolerated the procedure well without immediate complications.  [place] Yong Grieser, DO PGY-3, [facility]  08/13/2019 4:54 PM

## 2019-08-13 NOTE — Patient Instructions (Signed)

## 2019-08-14 ENCOUNTER — Encounter: Payer: Self-pay | Admitting: Family Medicine

## 2019-08-31 ENCOUNTER — Other Ambulatory Visit: Payer: Self-pay

## 2019-08-31 ENCOUNTER — Ambulatory Visit (INDEPENDENT_AMBULATORY_CARE_PROVIDER_SITE_OTHER): Payer: BC Managed Care – PPO | Admitting: Family Medicine

## 2019-08-31 ENCOUNTER — Encounter: Payer: Self-pay | Admitting: Family Medicine

## 2019-08-31 ENCOUNTER — Ambulatory Visit (HOSPITAL_BASED_OUTPATIENT_CLINIC_OR_DEPARTMENT_OTHER)
Admission: RE | Admit: 2019-08-31 | Discharge: 2019-08-31 | Disposition: A | Discharge status: Home / Self Care | Payer: BC Managed Care – PPO | Source: Ambulatory Visit | Attending: Family Medicine | Admitting: Family Medicine

## 2019-08-31 VITALS — BP 100/70 | HR 82 | Temp 97.6°F | Resp 18 | Ht 62.0 in | Wt 143.0 lb

## 2019-08-31 DIAGNOSIS — M545 Low back pain, unspecified: Secondary | ICD-10-CM

## 2019-08-31 MED ORDER — TRAMADOL HCL 50 MG PO TABS: 50.0000 mg | ORAL_TABLET | Freq: Three times a day (TID) | ORAL | 0 refills | Status: AC | PRN

## 2019-08-31 MED ORDER — CYCLOBENZAPRINE HCL 10 MG PO TABS: 10.0000 mg | ORAL_TABLET | Freq: Three times a day (TID) | ORAL | 0 refills | Status: DC | PRN

## 2019-08-31 MED ORDER — PREDNISONE 10 MG PO TABS: ORAL_TABLET | ORAL | 0 refills | Status: DC

## 2019-08-31 NOTE — Patient Instructions (Addendum)
Acute Back Pain, Adult Acute back pain is sudden and usually short-lived. It is often caused by an injury to the muscles and tissues in the back. The injury may result from:  A muscle or ligament getting overstretched or torn (strained). Ligaments are tissues that connect bones to each other. Lifting something improperly can cause a back strain.  Wear and tear (degeneration) of the spinal disks. Spinal disks are circular tissue that provides cushioning between the bones of the spine (vertebrae).  Twisting motions, such as while playing sports or doing yard work.  A hit to the back.  Arthritis. You may have a physical exam, lab tests, and imaging tests to find the cause of your pain. Acute back pain usually goes away with rest and home care. Follow these instructions at home: Managing pain, stiffness, and swelling  Take over-the-counter and prescription medicines only as told by your health care provider.  Your health care provider may recommend applying ice during the first 24-48 hours after your pain starts. To do this: ? Put ice in a plastic bag. ? Place a towel between your skin and the bag. ? Leave the ice on for 20 minutes, 2-3 times a day.  If directed, apply heat to the affected area as often as told by your health care provider. Use the heat source that your health care provider recommends, such as a moist heat pack or a heating pad. ? Place a towel between your skin and the heat source. ? Leave the heat on for 20-30 minutes. ? Remove the heat if your skin turns bright red. This is especially important if you are unable to feel pain, heat, or cold. You have a greater risk of getting burned. Activity   Do not stay in bed. Staying in bed for more than 1-2 days can delay your recovery.  Sit up and stand up straight. Avoid leaning forward when you sit, or hunching over when you stand. ? If you work at a desk, sit close to it so you do not need to lean over. Keep your chin tucked  in. Keep your neck drawn back, and keep your elbows bent at a right angle. Your arms should look like the letter "L." ? Sit high and close to the steering wheel when you drive. Add lower back (lumbar) support to your car seat, if needed.  Take short walks on even surfaces as soon as you are able. Try to increase the length of time you walk each day.  Do not sit, drive, or stand in one place for more than 30 minutes at a time. Sitting or standing for long periods of time can put stress on your back.  Do not drive or use heavy machinery while taking prescription pain medicine.  Use proper lifting techniques. When you bend and lift, use positions that put less stress on your back: ? Bend your knees. ? Keep the load close to your body. ? Avoid twisting.  Exercise regularly as told by your health care provider. Exercising helps your back heal faster and helps prevent back injuries by keeping muscles strong and flexible.  Work with a physical therapist to make a safe exercise program, as recommended by your health care provider. Do any exercises as told by your physical therapist. Lifestyle  Maintain a healthy weight. Extra weight puts stress on your back and makes it difficult to have good posture.  Avoid activities or situations that make you feel anxious or stressed. Stress and anxiety increase muscle   tension and can make back pain worse. Learn ways to manage anxiety and stress, such as through exercise. General instructions  Sleep on a firm mattress in a comfortable position. Try lying on your side with your knees slightly bent. If you lie on your back, put a pillow under your knees.  Follow your treatment plan as told by your health care provider. This may include: ? Cognitive or behavioral therapy. ? Acupuncture or massage therapy. ? Meditation or yoga. Contact a health care provider if:  You have pain that is not relieved with rest or medicine.  You have increasing pain going down  into your legs or buttocks.  Your pain does not improve after 2 weeks.  You have pain at night.  You lose weight without trying.  You have a fever or chills. Get help right away if:  You develop new bowel or bladder control problems.  You have unusual weakness or numbness in your arms or legs.  You develop nausea or vomiting.  You develop abdominal pain.  You feel faint. Summary  Acute back pain is sudden and usually short-lived.  Use proper lifting techniques. When you bend and lift, use positions that put less stress on your back.  Take over-the-counter and prescription medicines and apply heat or ice as directed by your health care provider. This information is not intended to replace advice given to you by your health care provider. Make sure you discuss any questions you have with your health care provider. Document Revised: 10/17/2018 Document Reviewed: 02/09/2017 Elsevier Patient Education  2020 [employer]  

## 2019-08-31 NOTE — Progress Notes (Signed)
Patient ID: Karen Case, female    DOB: Feb 23, 1966  Age: 54 y.o. MRN: [id number]    Subjective:  Subjective  HPI Karen Case presents for 1 week hx R hip pain that radiates down right leg to ankle --- no known injury  This occurred a few years ago but not as bad.    Review of Systems  Constitutional: Negative for appetite change, diaphoresis, fatigue and unexpected weight change.  Eyes: Negative for pain, redness and visual disturbance.  Respiratory: Negative for cough, chest tightness, shortness of breath and wheezing.   Cardiovascular: Negative for chest pain, palpitations and leg swelling.  Endocrine: Negative for cold intolerance, heat intolerance, polydipsia, polyphagia and polyuria.  Genitourinary: Negative for difficulty urinating, dysuria and frequency.  Musculoskeletal: Positive for back pain and gait problem.  Neurological: Negative for dizziness, light-headedness, numbness and headaches.    History Past Medical History:  Diagnosis Date  . Birth control    husband's vasectomy  . Chronic constipation 06/30/2011  . GERD (gastroesophageal reflux disease)     She has a past surgical history that includes Bunionectomy (2000) and Biopsy tear sac (Right, 2012).   Her family history includes Heart attack in her father; Hypertension in her mother.She reports that she has never smoked. She has never used smokeless tobacco. She reports current alcohol use. She reports that she does not use drugs.  Current Outpatient Medications on File Prior to Visit  Medication Sig Dispense Refill  . cholecalciferol (VITAMIN D3) 25 MCG (1000 UT) tablet Take 1,000 Units by mouth daily.    Marland Kitchen ibuprofen (ADVIL) 400 MG tablet Take 400 mg by mouth every 8 (eight) hours as needed.    . naproxen sodium (ALEVE) 220 MG tablet Take 220 mg by mouth.     No current facility-administered medications on file prior to visit.     Objective:  Objective  Physical Exam Vitals and nursing note  reviewed.  Constitutional:      Appearance: She is well-developed.  HENT:     Head: Normocephalic and atraumatic.  Eyes:     Conjunctiva/sclera: Conjunctivae normal.  Neck:     Thyroid: No thyromegaly.     Vascular: No carotid bruit or JVD.  Cardiovascular:     Rate and Rhythm: Normal rate and regular rhythm.     Heart sounds: Normal heart sounds. No murmur.  Pulmonary:     Effort: Pulmonary effort is normal. No respiratory distress.     Breath sounds: Normal breath sounds. No wheezing or rales.  Chest:     Chest wall: No tenderness.  Musculoskeletal:        General: Tenderness present. No signs of injury. Normal range of motion.     Cervical back: Normal range of motion and neck supple.     Right lower leg: No edema.     Left lower leg: No edema.  Neurological:     General: No focal deficit present.     Mental Status: She is alert and oriented to person, place, and time.     Motor: No weakness.     Coordination: Coordination normal.     Gait: Gait abnormal.     Deep Tendon Reflexes: Reflexes normal.     Comments: Pt describes pain r hip that radiates down r leg to ankle  No weakness No foot drop      BP 100/70 (BP Location: Left Arm, Patient Position: Sitting, Cuff Size: Normal)   Pulse 82   Temp 97.6 F (36.4  C) (Temporal)   Resp 18   Ht 5\' 2"  (1.575 m)   Wt 143 lb (64.9 kg)   LMP 07/15/2012   SpO2 98%   BMI 26.16 kg/m  Wt Readings from Last 3 Encounters:  08/31/19 143 lb (64.9 kg)  08/13/19 144 lb (65.3 kg)  04/09/19 136 lb 4 oz (61.8 kg)     Lab Results  Component Value Date   WBC 4.6 12/21/2018   HGB 12.5 12/21/2018   HCT 37.6 12/21/2018   PLT 325 12/21/2018   GLUCOSE 97 12/21/2018   CHOL 196 07/24/2018   TRIG 87.0 07/24/2018   HDL 56.70 07/24/2018   LDLCALC 122 (H) 07/24/2018   ALT 20 12/21/2018   AST 17 12/21/2018   NA 140 12/21/2018   K 4.2 12/21/2018   CL 105 12/21/2018   CREATININE 0.66 12/21/2018   BUN 18 12/21/2018   CO2 28  12/21/2018   TSH 1.14 12/21/2018   INR 1.0 10/27/2018    MM 3D SCREEN BREAST BILATERAL  Result Date: 06/05/2019 CLINICAL DATA:  Screening. EXAM: DIGITAL SCREENING BILATERAL MAMMOGRAM WITH TOMO AND CAD COMPARISON:  Previous exam(s). ACR Breast Density Category b: There are scattered areas of fibroglandular density. FINDINGS: There are no findings suspicious for malignancy. Images were processed with CAD. IMPRESSION: No mammographic evidence of malignancy. A result letter of this screening mammogram will be mailed directly to the patient. RECOMMENDATION: Screening mammogram in one year. (Code:SM-B-01Y) BI-RADS CATEGORY  1: Negative. Electronically Signed   By: Audie Pinto M.D.   On: 06/05/2019 11:59     Assessment & Plan:  Plan  I have discontinued Erisa I. Kriesel's ketoconazole and predniSONE. I am also having her start on predniSONE, cyclobenzaprine, and traMADol. Additionally, I am having her maintain her cholecalciferol, ibuprofen, and naproxen sodium.  Meds ordered this encounter  Medications  . predniSONE (DELTASONE) 10 MG tablet    Sig: TAKE 3 TABLETS PO QD FOR 3 DAYS THEN TAKE 2 TABLETS PO QD FOR 3 DAYS THEN TAKE 1 TABLET PO QD FOR 3 DAYS THEN TAKE 1/2 TAB PO QD FOR 3 DAYS    Dispense:  20 tablet    Refill:  0  . cyclobenzaprine (FLEXERIL) 10 MG tablet    Sig: Take 1 tablet (10 mg total) by mouth 3 (three) times daily as needed for muscle spasms.    Dispense:  30 tablet    Refill:  0  . traMADol (ULTRAM) 50 MG tablet    Sig: Take 1 tablet (50 mg total) by mouth every 8 (eight) hours as needed for up to 5 days.    Dispense:  15 tablet    Refill:  0    Problem List Items Addressed This Visit    None    Visit Diagnoses    Low back pain with radiation    -  Primary   Relevant Medications   predniSONE (DELTASONE) 10 MG tablet   cyclobenzaprine (FLEXERIL) 10 MG tablet   traMADol (ULTRAM) 50 MG tablet   Other Relevant Orders   DG Lumbar Spine Complete       Follow-up: Return in about 2 weeks (around 09/14/2019), or if symptoms worsen or fail to improve, for back pain.  Ann Held, DO

## 2019-09-10 ENCOUNTER — Ambulatory Visit (INDEPENDENT_AMBULATORY_CARE_PROVIDER_SITE_OTHER): Payer: BC Managed Care – PPO | Admitting: Family Medicine

## 2019-09-10 ENCOUNTER — Other Ambulatory Visit: Payer: Self-pay

## 2019-09-10 VITALS — BP 110/76 | Ht 62.0 in | Wt 141.0 lb

## 2019-09-10 DIAGNOSIS — M5416 Radiculopathy, lumbar region: Secondary | ICD-10-CM

## 2019-09-10 DIAGNOSIS — M25561 Pain in right knee: Secondary | ICD-10-CM | POA: Diagnosis not present

## 2019-09-10 NOTE — Patient Instructions (Addendum)
I'm glad your knee feels better! If your back is only mildly improved compared to last visit contact Dr. Carollee Herter. Things to consider for your back: physical therapy, prescription anti-inflammatories now that you're done with the prednisone, MRI of lumbar spine (but as we discussed this is typically only if your pain is bad enough you'd consider a back injection). Otherwise follow up with me as needed.

## 2019-09-10 NOTE — Progress Notes (Addendum)
PCP: Colon Branch, MD  Subjective:   HPI: Patient is a 54 y.o. female here for f/u of right knee injection.  Following joint injection 1 month prior patient has had excellent response. 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 Previously she described lateral joint line pain and pain with ambulation with "tendency for knee to turn out," however these symptoms have resolved.   Patient does note one other issue; she has been evaluated for RLE pain/tingling that radiates to the lower calf that was diagnosed as sciatica. Has been prescirbed flexeril, tramadol, and prednisone and notes moderate improvement although symptoms persist, about 50% improved from initial treatment. Otherwise denies weakness, fever, bladder or bowel incontinence. Problem is currently followed by family medicine provider Dr. Cheri Rous.   Past Medical History:  Diagnosis Date  . Birth control    husband's vasectomy  . Chronic constipation 06/30/2011  . GERD (gastroesophageal reflux disease)     Current Outpatient Medications on File Prior to Visit  Medication Sig Dispense Refill  . cyclobenzaprine (FLEXERIL) 10 MG tablet Take 1 tablet (10 mg total) by mouth 3 (three) times daily as needed for muscle spasms. 30 tablet 0  . cholecalciferol (VITAMIN D3) 25 MCG (1000 UT) tablet Take 1,000 Units by mouth daily.    Marland Kitchen ibuprofen (ADVIL) 400 MG tablet Take 400 mg by mouth every 8 (eight) hours as needed.    . naproxen sodium (ALEVE) 220 MG tablet Take 220 mg by mouth.    . predniSONE (DELTASONE) 10 MG tablet TAKE 3 TABLETS PO QD FOR 3 DAYS THEN TAKE 2 TABLETS PO QD FOR 3 DAYS THEN TAKE 1 TABLET PO QD FOR 3 DAYS THEN TAKE 1/2 TAB PO QD FOR 3 DAYS (Patient not taking: Reported on 09/10/2019) 20 tablet 0   No current facility-administered medications on file prior to visit.    Past Surgical History:  Procedure Laterality Date  . BIOPSY TEAR [place] Right 2012   Right lacrimal gland biopsy, chronic inflammation, no malignancy  . BUNIONECTOMY  2000   Right    No Known Allergies  Social History   Socioeconomic History  . Marital status: Married    Spouse name: Francee Piccolo  . Number of children: 2  . Years of education: Not on file  . Highest education level: Some college, no degree  Occupational History  . Occupation: part time job  Tobacco Use  . Smoking status: Never Smoker  . Smokeless tobacco: Never Used  Substance and Sexual Activity  . Alcohol use: Yes    Alcohol/week: 0.0 standard drinks    Comment: socially   . Drug use: No  . Sexual activity: Not on file  Other Topics Concern  . Not on file  Social History Narrative   Original from [place], moved to [place] in 1994    2 daughters   [place]       Patient is right-handed. She lives with her husband in a 2 level home. She drinks one large mug of coffee a day and sometimes tea. She walks her dog, and before COVID restrictions, she went to the gym 2-3 x a week for Zumba.   Social Determinants of Health   Financial Resource Strain:   . Difficulty of Paying Living Expenses: Not on file  Food Insecurity:   . Worried About Charity fundraiser in the Last Year: Not on  file  . [place] in the Last Year: Not on file  Transportation Needs:   . Lack of Transportation (Medical): Not on file  . Lack of Transportation (Non-Medical): Not on file  Physical Activity:   . Days of Exercise per Week: Not on file  . Minutes of Exercise per Session: Not on file  Stress:   . Feeling of Stress : Not on file  Social Connections:   . Frequency of Communication with Friends and Family: Not on file  . Frequency of Social Gatherings with Friends and Family: Not on file  . Attends Religious Services: Not on file  . Active Member of Clubs or Organizations: Not on file  . Attends Archivist Meetings: Not on file  . Marital Status: Not on file  Intimate Partner  Violence:   . Fear of Current or Ex-Partner: Not on file  . Emotionally Abused: Not on file  . Physically Abused: Not on file  . Sexually Abused: Not on file    Family History  Problem Relation Age of Onset  . Hypertension Mother        M  . Heart attack Father   . Hyperlipidemia Neg Hx   . Coronary artery disease Neg Hx   . Stroke Neg Hx   . Colon cancer Neg Hx   . Breast cancer Neg Hx   . Esophageal cancer Neg Hx   . Liver cancer Neg Hx   . Pancreatic cancer Neg Hx   . Prostate cancer Neg Hx   . Rectal cancer Neg Hx   . Stomach cancer Neg Hx     BP 110/76   Ht 5\' 2"  (1.575 m)   Wt 141 lb (64 kg)   LMP 07/15/2012   BMI 25.79 kg/m   Review of Systems: See HPI above.     Objective:  Physical Exam:  Gen: Alert and oriented x3, NAD, comfortable in exam room HEENT: normocephalic and atraumatic, EOMI, PERRLA, conjunctivae and sclerae normal bilaterally Lungs: breathing comfortably on RA, no WOB Neuro: sensation intact through, no coordination or balance inssues R Knee -normal to inspection, no erythema/bruising/effusion/bony abnormalities -no tenderness to palpation along medial/lateral joint lines, pre-patellar/pes anserine bursa -complete ROM normal in flexion and extension (0-135 degrees) -strength 5/5 in flexion and extension -negative valgus/varus, ant drawer, post drawer, lachmanns -negative mcmurrays, apleys.  Back: -no overlying erythema/bruising/bony abnormalities, lipoma palpated just right of midline sacral region, nontender -no vertebral point tenderness.  No paraspinal tenderness -FROM -strength 5/5 bilateral lower extremities -sensation intact to light touch -mild positive SLR on right -negative logroll hip   Assessment & Plan:   Patient is a 54 year old female presenting for f/u of right knee steroid injection 1 month ago d/t to right knee pain from OA that is resolving following intervention. Patient pleased with improved pain and mobility as  well as the ability to work on her feet throughout the day without recurrence of symptoms.   In regards to sciatica, counseled patient that symptoms may improve with current treatment regimen as prescribed by Dr. Carollee Herter. Discussed contacting her to consider additional steps if not continuing to improve which may include PT, nsaids.  Discussed MRI would only be a consideration if this is bad enough she would consider ESI as she has no red flags and exam is otherwise reassuring.  Carolyne Littles MS4

## 2019-09-11 ENCOUNTER — Encounter: Payer: Self-pay | Admitting: Family Medicine

## 2019-10-05 ENCOUNTER — Other Ambulatory Visit: Payer: Self-pay

## 2019-10-09 ENCOUNTER — Encounter: Payer: Self-pay | Admitting: Internal Medicine

## 2019-10-09 ENCOUNTER — Ambulatory Visit (INDEPENDENT_AMBULATORY_CARE_PROVIDER_SITE_OTHER): Payer: BC Managed Care – PPO | Admitting: Internal Medicine

## 2019-10-09 ENCOUNTER — Other Ambulatory Visit: Payer: Self-pay

## 2019-10-09 VITALS — BP 98/70 | HR 85 | Temp 97.9°F | Resp 18 | Ht 62.0 in | Wt 139.0 lb

## 2019-10-09 DIAGNOSIS — Z0000 Encounter for general adult medical examination without abnormal findings: Secondary | ICD-10-CM | POA: Diagnosis not present

## 2019-10-09 DIAGNOSIS — M545 Low back pain, unspecified: Secondary | ICD-10-CM

## 2019-10-09 DIAGNOSIS — R2231 Localized swelling, mass and lump, right upper limb: Secondary | ICD-10-CM

## 2019-10-09 LAB — CBC WITH DIFFERENTIAL/PLATELET
Basophils Absolute: 0 10*3/uL (ref 0.0–0.1)
Basophils Relative: 0.6 % (ref 0.0–3.0)
Eosinophils Absolute: 0.2 10*3/uL (ref 0.0–0.7)
Eosinophils Relative: 3.1 % (ref 0.0–5.0)
HCT: 39.4 % (ref 36.0–46.0)
Hemoglobin: 13.6 g/dL (ref 12.0–15.0)
Lymphocytes Relative: 30.4 % (ref 12.0–46.0)
Lymphs Abs: 1.5 10*3/uL (ref 0.7–4.0)
MCHC: 34.5 g/dL (ref 30.0–36.0)
MCV: 96 fl (ref 78.0–100.0)
Monocytes Absolute: 0.4 10*3/uL (ref 0.1–1.0)
Monocytes Relative: 8 % (ref 3.0–12.0)
Neutro Abs: 2.9 10*3/uL (ref 1.4–7.7)
Neutrophils Relative %: 57.9 % (ref 43.0–77.0)
Platelets: 365 10*3/uL (ref 150.0–400.0)
RBC: 4.11 Mil/uL (ref 3.87–5.11)
RDW: 12.3 % (ref 11.5–15.5)
WBC: 5 10*3/uL (ref 4.0–10.5)

## 2019-10-09 LAB — LIPID PANEL
Cholesterol: 174 mg/dL (ref 0–200)
HDL: 44.7 mg/dL (ref >39.00)
LDL Cholesterol: 111 mg/dL — ABNORMAL HIGH (ref 0–99)
NonHDL: 129.67
Total CHOL/HDL Ratio: 4
Triglycerides: 95 mg/dL (ref 0.0–149.0)
VLDL: 19 mg/dL (ref 0.0–40.0)

## 2019-10-09 MED ORDER — CYCLOBENZAPRINE HCL 10 MG PO TABS: 10.0000 mg | ORAL_TABLET | Freq: Two times a day (BID) | ORAL | 0 refills | Status: DC | PRN

## 2019-10-09 NOTE — Assessment & Plan Note (Signed)
-  Td 2012 - s/p covid vaccines -CCS:  s/p cscope, 09/2017, next per GI -Female care per gyn , [facility] 05/2019 -Very active at work but no routine exercise, she follows a healthy diet.  -She does take vit D qd -Labs reviewed, will order FLP and CBC

## 2019-10-09 NOTE — Patient Instructions (Addendum)
We will refer you to physical therapy  Will refill Flexeril the muscle relaxant  Continue taking vitamin D over-the-counter  You can take MiraLAX as needed for constipation either daily or every other day  We will refer you to dermatology  GO TO THE LAB : Get the blood work     [place], please reschedule your appointments Come back for   a physical exam in 1 year

## 2019-10-09 NOTE — Assessment & Plan Note (Signed)
Here for CPX Constipation: Currently on Dulcolax, alternatives?  Recommend MiraLAX daily or every other day as needed. Back pain: Had back pain with radiation to the right leg, seen by sports medicine, it is better but not completely well.  Flexeril did help, will refill it.  We also talk about stretching and we agreed to send to physical therapy for formal evaluation and treatment. Lump, right shoulder: Patient mentioned thise before, she continued to be concerned, excision?  Will refer to dermatology RTC 1 year

## 2019-10-09 NOTE — Progress Notes (Signed)
Subjective:    Patient ID: Karen Case, female    DOB: 06/09/1966, 54 y.o.   MRN: [id number]  DOS:  10/09/2019 Type of visit - description: CPX Multiple other issues discussed today.     Review of Systems Still have back pain Has a lump on the right shoulder Occasional constipation   Other than above, a 14 point review of systems is negative    Past Medical History:  Diagnosis Date  . Birth control    husband's vasectomy  . Chronic constipation 06/30/2011  . GERD (gastroesophageal reflux disease)     Past Surgical History:  Procedure Laterality Date  . BIOPSY TEAR [place] Right 2012   Right lacrimal gland biopsy, chronic inflammation, no malignancy  . BUNIONECTOMY  2000   Right   Family History  Problem Relation Age of Onset  . Hypertension Mother        M  . Heart attack Father   . Hyperlipidemia Neg Hx   . Coronary artery disease Neg Hx   . Stroke Neg Hx   . Colon cancer Neg Hx   . Breast cancer Neg Hx   . Esophageal cancer Neg Hx   . Liver cancer Neg Hx   . Pancreatic cancer Neg Hx   . Prostate cancer Neg Hx   . Rectal cancer Neg Hx   . Stomach cancer Neg Hx     Allergies as of 10/09/2019   No Known Allergies     Medication List       Accurate as of October 09, 2019  8:51 PM. If you have any questions, ask your nurse or doctor.        STOP taking these medications   ibuprofen 400 MG tablet Commonly known as: ADVIL Stopped by: Kathlene November, MD   naproxen sodium 220 MG tablet Commonly known as: ALEVE Stopped by: Kathlene November, MD   predniSONE 10 MG tablet Commonly known as: DELTASONE Stopped by: Kathlene November, MD     TAKE these medications   cholecalciferol 25 MCG (1000 UNIT) tablet Commonly known as: VITAMIN D3 Take 1,000 Units by mouth daily.   cyclobenzaprine 10 MG tablet Commonly known as: FLEXERIL Take 1 tablet (10 mg total) by mouth 2 (two) times daily as needed for muscle spasms. What changed: when to take this Changed by: Kathlene November, MD            Objective:   Physical Exam Skin:        BP 98/70 (BP Location: Left Arm, Patient Position: Sitting, Cuff Size: Normal)   Pulse 85   Temp 97.9 F (36.6 C) (Temporal)   Resp 18   Ht 5\' 2"  (1.575 m)   Wt 139 lb (63 kg)   LMP 07/15/2012   SpO2 99%   BMI 25.42 kg/m  General: Well developed, NAD, BMI noted Neck: No  thyromegaly  HEENT:  Normocephalic . Face symmetric, atraumatic Lungs:  CTA B Normal respiratory effort, no intercostal retractions, no accessory muscle use. Heart: RRR,  no murmur.  Abdomen:  Not distended, soft, non-tender. No rebound or rigidity.   Lower extremities: no pretibial edema bilaterally  Neurologic:  alert & oriented X3.  Speech normal, gait appropriate for age and unassisted Strength symmetric and appropriate for age.  Psych: Cognition and judgment appear intact.  Cooperative with normal attention span and concentration.  Behavior appropriate. No anxious or depressed appearing.     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  Recommend MiraLAX daily or every other day as needed. Back pain: Had back pain with radiation to the right leg, seen by sports medicine, it is better but not completely well.  Flexeril did help, will refill it.  We also talk about stretching and we agreed to send to physical therapy for formal evaluation and treatment. Lump, right shoulder: Patient mentioned thise before, she continued to be concerned, excision?  Will refer to dermatology RTC 1 year   This visit occurred during the SARS-CoV-2 public health emergency.  Safety protocols were in place, including screening questions prior to the visit, additional usage of staff PPE, and extensive cleaning of exam room while observing appropriate contact time as indicated for  disinfecting solutions.

## 2019-10-17 DIAGNOSIS — Z01419 Encounter for gynecological examination (general) (routine) without abnormal findings: Secondary | ICD-10-CM | POA: Diagnosis not present

## 2019-10-19 DIAGNOSIS — R531 Weakness: Secondary | ICD-10-CM | POA: Diagnosis not present

## 2019-10-19 DIAGNOSIS — M79604 Pain in right leg: Secondary | ICD-10-CM | POA: Diagnosis not present

## 2019-10-19 DIAGNOSIS — R293 Abnormal posture: Secondary | ICD-10-CM | POA: Diagnosis not present

## 2019-10-19 DIAGNOSIS — M5416 Radiculopathy, lumbar region: Secondary | ICD-10-CM | POA: Diagnosis not present

## 2019-10-23 DIAGNOSIS — R531 Weakness: Secondary | ICD-10-CM | POA: Diagnosis not present

## 2019-10-23 DIAGNOSIS — M5416 Radiculopathy, lumbar region: Secondary | ICD-10-CM | POA: Diagnosis not present

## 2019-10-23 DIAGNOSIS — M79604 Pain in right leg: Secondary | ICD-10-CM | POA: Diagnosis not present

## 2019-10-23 DIAGNOSIS — R293 Abnormal posture: Secondary | ICD-10-CM | POA: Diagnosis not present

## 2019-10-26 DIAGNOSIS — M79604 Pain in right leg: Secondary | ICD-10-CM | POA: Diagnosis not present

## 2019-10-26 DIAGNOSIS — M5416 Radiculopathy, lumbar region: Secondary | ICD-10-CM | POA: Diagnosis not present

## 2019-10-26 DIAGNOSIS — R531 Weakness: Secondary | ICD-10-CM | POA: Diagnosis not present

## 2019-10-26 DIAGNOSIS — R293 Abnormal posture: Secondary | ICD-10-CM | POA: Diagnosis not present

## 2019-10-30 DIAGNOSIS — R531 Weakness: Secondary | ICD-10-CM | POA: Diagnosis not present

## 2019-10-30 DIAGNOSIS — R293 Abnormal posture: Secondary | ICD-10-CM | POA: Diagnosis not present

## 2019-10-30 DIAGNOSIS — M5416 Radiculopathy, lumbar region: Secondary | ICD-10-CM | POA: Diagnosis not present

## 2019-10-30 DIAGNOSIS — M79604 Pain in right leg: Secondary | ICD-10-CM | POA: Diagnosis not present

## 2019-11-06 DIAGNOSIS — R531 Weakness: Secondary | ICD-10-CM | POA: Diagnosis not present

## 2019-11-06 DIAGNOSIS — M79604 Pain in right leg: Secondary | ICD-10-CM | POA: Diagnosis not present

## 2019-11-06 DIAGNOSIS — R293 Abnormal posture: Secondary | ICD-10-CM | POA: Diagnosis not present

## 2019-11-06 DIAGNOSIS — M5416 Radiculopathy, lumbar region: Secondary | ICD-10-CM | POA: Diagnosis not present

## 2019-11-08 DIAGNOSIS — M79604 Pain in right leg: Secondary | ICD-10-CM | POA: Diagnosis not present

## 2019-11-08 DIAGNOSIS — R293 Abnormal posture: Secondary | ICD-10-CM | POA: Diagnosis not present

## 2019-11-08 DIAGNOSIS — R531 Weakness: Secondary | ICD-10-CM | POA: Diagnosis not present

## 2019-11-08 DIAGNOSIS — M5416 Radiculopathy, lumbar region: Secondary | ICD-10-CM | POA: Diagnosis not present

## 2019-11-13 DIAGNOSIS — M79604 Pain in right leg: Secondary | ICD-10-CM | POA: Diagnosis not present

## 2019-11-13 DIAGNOSIS — R531 Weakness: Secondary | ICD-10-CM | POA: Diagnosis not present

## 2019-11-13 DIAGNOSIS — M5416 Radiculopathy, lumbar region: Secondary | ICD-10-CM | POA: Diagnosis not present

## 2019-11-13 DIAGNOSIS — R293 Abnormal posture: Secondary | ICD-10-CM | POA: Diagnosis not present

## 2019-11-22 DIAGNOSIS — D485 Neoplasm of uncertain behavior of skin: Secondary | ICD-10-CM | POA: Diagnosis not present

## 2019-11-22 DIAGNOSIS — M5416 Radiculopathy, lumbar region: Secondary | ICD-10-CM | POA: Diagnosis not present

## 2019-11-22 DIAGNOSIS — R293 Abnormal posture: Secondary | ICD-10-CM | POA: Diagnosis not present

## 2019-11-22 DIAGNOSIS — R531 Weakness: Secondary | ICD-10-CM | POA: Diagnosis not present

## 2019-11-22 DIAGNOSIS — M79604 Pain in right leg: Secondary | ICD-10-CM | POA: Diagnosis not present

## 2019-11-22 HISTORY — PX: LIPOMA EXCISION: SHX5283

## 2019-11-27 ENCOUNTER — Telehealth: Payer: Self-pay

## 2019-11-27 DIAGNOSIS — R531 Weakness: Secondary | ICD-10-CM | POA: Diagnosis not present

## 2019-11-27 DIAGNOSIS — M79604 Pain in right leg: Secondary | ICD-10-CM | POA: Diagnosis not present

## 2019-11-27 DIAGNOSIS — M5416 Radiculopathy, lumbar region: Secondary | ICD-10-CM | POA: Diagnosis not present

## 2019-11-27 DIAGNOSIS — M793 Panniculitis, unspecified: Secondary | ICD-10-CM | POA: Diagnosis not present

## 2019-11-27 DIAGNOSIS — R293 Abnormal posture: Secondary | ICD-10-CM | POA: Diagnosis not present

## 2019-11-27 NOTE — Telephone Encounter (Signed)
PT plan of care received from [facility]. Form signed and faxed back to [phone]. Form sent for scanning.

## 2019-11-29 DIAGNOSIS — R531 Weakness: Secondary | ICD-10-CM | POA: Diagnosis not present

## 2019-11-29 DIAGNOSIS — R293 Abnormal posture: Secondary | ICD-10-CM | POA: Diagnosis not present

## 2019-11-29 DIAGNOSIS — M79604 Pain in right leg: Secondary | ICD-10-CM | POA: Diagnosis not present

## 2019-11-29 DIAGNOSIS — M5416 Radiculopathy, lumbar region: Secondary | ICD-10-CM | POA: Diagnosis not present

## 2019-11-30 DIAGNOSIS — T8149XA Infection following a procedure, other surgical site, initial encounter: Secondary | ICD-10-CM | POA: Diagnosis not present

## 2019-12-04 DIAGNOSIS — R293 Abnormal posture: Secondary | ICD-10-CM | POA: Diagnosis not present

## 2019-12-04 DIAGNOSIS — M79604 Pain in right leg: Secondary | ICD-10-CM | POA: Diagnosis not present

## 2019-12-04 DIAGNOSIS — R531 Weakness: Secondary | ICD-10-CM | POA: Diagnosis not present

## 2019-12-04 DIAGNOSIS — M5416 Radiculopathy, lumbar region: Secondary | ICD-10-CM | POA: Diagnosis not present

## 2019-12-06 DIAGNOSIS — R531 Weakness: Secondary | ICD-10-CM | POA: Diagnosis not present

## 2019-12-06 DIAGNOSIS — M5416 Radiculopathy, lumbar region: Secondary | ICD-10-CM | POA: Diagnosis not present

## 2019-12-06 DIAGNOSIS — M79604 Pain in right leg: Secondary | ICD-10-CM | POA: Diagnosis not present

## 2019-12-06 DIAGNOSIS — R293 Abnormal posture: Secondary | ICD-10-CM | POA: Diagnosis not present

## 2019-12-18 DIAGNOSIS — R293 Abnormal posture: Secondary | ICD-10-CM | POA: Diagnosis not present

## 2019-12-18 DIAGNOSIS — M79604 Pain in right leg: Secondary | ICD-10-CM | POA: Diagnosis not present

## 2019-12-18 DIAGNOSIS — M5416 Radiculopathy, lumbar region: Secondary | ICD-10-CM | POA: Diagnosis not present

## 2019-12-18 DIAGNOSIS — R531 Weakness: Secondary | ICD-10-CM | POA: Diagnosis not present

## 2019-12-20 DIAGNOSIS — R531 Weakness: Secondary | ICD-10-CM | POA: Diagnosis not present

## 2019-12-20 DIAGNOSIS — R293 Abnormal posture: Secondary | ICD-10-CM | POA: Diagnosis not present

## 2019-12-20 DIAGNOSIS — M5416 Radiculopathy, lumbar region: Secondary | ICD-10-CM | POA: Diagnosis not present

## 2019-12-20 DIAGNOSIS — M79604 Pain in right leg: Secondary | ICD-10-CM | POA: Diagnosis not present

## 2019-12-21 ENCOUNTER — Telehealth: Payer: Self-pay

## 2019-12-21 NOTE — Telephone Encounter (Signed)
Plan of care received from [facility]- form signed and faxed back to [phone]. Form sent for scanning.  

## 2019-12-25 DIAGNOSIS — R531 Weakness: Secondary | ICD-10-CM | POA: Diagnosis not present

## 2019-12-25 DIAGNOSIS — R293 Abnormal posture: Secondary | ICD-10-CM | POA: Diagnosis not present

## 2019-12-25 DIAGNOSIS — M5416 Radiculopathy, lumbar region: Secondary | ICD-10-CM | POA: Diagnosis not present

## 2019-12-25 DIAGNOSIS — M79604 Pain in right leg: Secondary | ICD-10-CM | POA: Diagnosis not present

## 2020-01-01 DIAGNOSIS — M5416 Radiculopathy, lumbar region: Secondary | ICD-10-CM | POA: Diagnosis not present

## 2020-01-01 DIAGNOSIS — M79604 Pain in right leg: Secondary | ICD-10-CM | POA: Diagnosis not present

## 2020-01-01 DIAGNOSIS — R293 Abnormal posture: Secondary | ICD-10-CM | POA: Diagnosis not present

## 2020-01-01 DIAGNOSIS — R531 Weakness: Secondary | ICD-10-CM | POA: Diagnosis not present

## 2020-02-22 DIAGNOSIS — J329 Chronic sinusitis, unspecified: Secondary | ICD-10-CM | POA: Diagnosis not present

## 2020-02-22 DIAGNOSIS — H6692 Otitis media, unspecified, left ear: Secondary | ICD-10-CM | POA: Diagnosis not present

## 2020-03-03 ENCOUNTER — Other Ambulatory Visit: Payer: Self-pay

## 2020-03-03 ENCOUNTER — Encounter: Payer: Self-pay | Admitting: Internal Medicine

## 2020-03-03 ENCOUNTER — Ambulatory Visit (INDEPENDENT_AMBULATORY_CARE_PROVIDER_SITE_OTHER): Payer: BC Managed Care – PPO | Admitting: Internal Medicine

## 2020-03-03 VITALS — BP 121/77 | HR 84 | Temp 98.3°F | Resp 16 | Ht 62.0 in | Wt 130.2 lb

## 2020-03-03 DIAGNOSIS — H9202 Otalgia, left ear: Secondary | ICD-10-CM

## 2020-03-03 DIAGNOSIS — M5410 Radiculopathy, site unspecified: Secondary | ICD-10-CM | POA: Diagnosis not present

## 2020-03-03 MED ORDER — PREDNISONE 10 MG PO TABS: ORAL_TABLET | ORAL | 0 refills | Status: DC

## 2020-03-03 NOTE — Patient Instructions (Addendum)
Continue Flonase 2 sprays on each side of the nose daily  Take prednisone as prescribed   For nasal congestion: Continue  OTC  Flonase : 2 nasal sprays on each side of the nose in the morning until you feel better   Get pseudoephedrine 30 mg (behind the counter, you need to talk with the pharmacist) take one tablet 3 or 4 times a day as needed for congestion   Call if not gradually better over the next  2 weeks    Call anytime if the symptoms are severe, you have high fever, short of breath, chest pain    We are referring you to a orthopedic doctor, until then take Tylenol as needed and is stretch daily

## 2020-03-03 NOTE — Assessment & Plan Note (Signed)
Left otalgia: Exam is essentially normal, had already 1 week of antibiotics.  ET dysfunction?  Mild sinusitis?. Plan: Continue Flonase, round of prednisone, also decongestants for few days, see AVS.  Call in 2 weeks if not improving Back pain: On and off back pain with radiation to the right leg.  PT helped, has seen sports medicine already, discussed the option of Ortho referral and she likes to proceed.  Referral sent.  Until then recommend Tylenol and consistent stretching.

## 2020-03-03 NOTE — Progress Notes (Signed)
Pre visit review using our clinic review tool, if applicable. No additional management support is needed unless otherwise documented below in the visit note. 

## 2020-03-03 NOTE — Progress Notes (Signed)
Subjective:    Patient ID: Karen Case, female    DOB: [birth year], 54 y.o.   MRN: [id number]  DOS:  03/03/2020 Type of visit - description: Acute 2 weeks history of left ear pain described as a fullness associated with some left sinus congestion. Went to urgent care, Rx Augmentin, Claritin, Flonase. Did finish antibiotic but symptoms are about the same.  Also history of on and off low back pain with associated radiation to the right leg particularly the right calf.  This physical therapy, it helped.  Doing occasional stretching at home.   Review of Systems Denies fever chills.  No itchy eyes or sneezing No nasal discharge per se No chest congestion or cough  Past Medical History:  Diagnosis Date  . Birth control    husband's vasectomy  . Chronic constipation 06/30/2011  . GERD (gastroesophageal reflux disease)     Past Surgical History:  Procedure Laterality Date  . BIOPSY TEAR [place] Right 2012   Right lacrimal gland biopsy, chronic inflammation, no malignancy  . BUNIONECTOMY  2000   Right  . LIPOMA EXCISION Right 11/22/2019   R arm    Allergies as of 03/03/2020   No Known Allergies     Medication List       Accurate as of March 03, 2020  9:02 AM. If you have any questions, ask your nurse or doctor.        cholecalciferol 25 MCG (1000 UNIT) tablet Commonly known as: VITAMIN D3 Take 1,000 Units by mouth daily.   cyclobenzaprine 10 MG tablet Commonly known as: FLEXERIL Take 1 tablet (10 mg total) by mouth 2 (two) times daily as needed for muscle spasms.          Objective:   Physical Exam BP 121/77 (BP Location: Left Arm, Patient Position: Sitting, Cuff Size: Small)   Pulse 84   Temp 98.3 F (36.8 C) (Oral)   Resp 16   Ht 5\' 2"  (1.575 m)   Wt 130 lb 4 oz (59.1 kg)   LMP 07/15/2012   SpO2 99%   BMI 23.82 kg/m   General:   Well developed, NAD, BMI noted. HEENT:  Normocephalic . Face symmetric, atraumatic Ears: TMs, canal without swelling  or redness.  No discharge. Trago sign negative bilaterally TMJ nontender to palpation. Sinuses: Slightly congested, no TTP.   Lungs:  CTA B Normal respiratory effort, no intercostal retractions, no accessory muscle use. Heart: RRR,  no murmur.  Lower extremities: no pretibial edema bilaterally  Skin: Not pale. Not jaundice Neurologic:  alert & oriented X3.  Speech normal, gait appropriate for age and unassisted Psych--  Cognition and judgment appear intact.  Cooperative with normal attention span and concentration.  Behavior appropriate. No anxious or depressed appearing.      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  ET dysfunction?  Mild sinusitis?. Plan: Continue Flonase, round of prednisone, also decongestants for few days, see AVS.  Call in 2 weeks if not improving Back pain: On and off back pain with radiation to the right leg.  PT helped, has seen sports medicine already, discussed the option of Ortho referral and she likes to proceed.  Referral sent.  Until then recommend Tylenol and consistent stretching.  This visit occurred during the SARS-CoV-2 public health emergency.  Safety protocols  were in place, including screening questions prior to the visit, additional usage of staff PPE, and extensive cleaning of exam room while observing appropriate contact time as indicated for disinfecting solutions.

## 2020-03-16 DIAGNOSIS — S80212A Abrasion, left knee, initial encounter: Secondary | ICD-10-CM | POA: Diagnosis not present

## 2020-03-16 DIAGNOSIS — S62301A Unspecified fracture of second metacarpal bone, left hand, initial encounter for closed fracture: Secondary | ICD-10-CM | POA: Diagnosis not present

## 2020-03-16 DIAGNOSIS — S50312A Abrasion of left elbow, initial encounter: Secondary | ICD-10-CM | POA: Diagnosis not present

## 2020-03-16 DIAGNOSIS — S60222A Contusion of left hand, initial encounter: Secondary | ICD-10-CM | POA: Diagnosis not present

## 2020-03-16 DIAGNOSIS — S62305A Unspecified fracture of fourth metacarpal bone, left hand, initial encounter for closed fracture: Secondary | ICD-10-CM | POA: Diagnosis not present

## 2020-03-18 DIAGNOSIS — M545 Low back pain: Secondary | ICD-10-CM | POA: Diagnosis not present

## 2020-03-18 DIAGNOSIS — M5416 Radiculopathy, lumbar region: Secondary | ICD-10-CM | POA: Diagnosis not present

## 2020-03-24 DIAGNOSIS — S62302D Unspecified fracture of third metacarpal bone, right hand, subsequent encounter for fracture with routine healing: Secondary | ICD-10-CM | POA: Diagnosis not present

## 2020-03-29 DIAGNOSIS — M25552 Pain in left hip: Secondary | ICD-10-CM | POA: Diagnosis not present

## 2020-03-29 DIAGNOSIS — S7012XA Contusion of left thigh, initial encounter: Secondary | ICD-10-CM | POA: Diagnosis not present

## 2020-04-02 ENCOUNTER — Other Ambulatory Visit: Payer: Self-pay

## 2020-04-02 ENCOUNTER — Encounter: Payer: Self-pay | Admitting: Internal Medicine

## 2020-04-02 ENCOUNTER — Ambulatory Visit (INDEPENDENT_AMBULATORY_CARE_PROVIDER_SITE_OTHER): Payer: BC Managed Care – PPO | Admitting: Internal Medicine

## 2020-04-02 VITALS — BP 134/81 | HR 77 | Temp 98.4°F | Resp 16 | Ht 62.0 in | Wt 130.4 lb

## 2020-04-02 DIAGNOSIS — S7002XA Contusion of left hip, initial encounter: Secondary | ICD-10-CM | POA: Diagnosis not present

## 2020-04-02 NOTE — Progress Notes (Signed)
Pre visit review using our clinic review tool, if applicable. No additional management support is needed unless otherwise documented below in the visit note. 

## 2020-04-02 NOTE — Progress Notes (Signed)
Subjective:    Patient ID: Karen Case, female    DOB: 1966/03/17, 54 y.o.   MRN: [id number]  DOS:  04/02/2020 Type of visit - description: Acute Had a fall on her driveway 12/14/4648. Landed on the left side. Fracture of the left hand (seen elsewhere) and injured the left knee.  About a week ago noted some lumps/swelling around the left hip associated with some pain. 3 days ago went to urgent care, x-rays of the hip reportedly normal.  Review of Systems See above   Past Medical History:  Diagnosis Date  . Birth control    husband's vasectomy  . Chronic constipation 06/30/2011  . GERD (gastroesophageal reflux disease)     Past Surgical History:  Procedure Laterality Date  . BIOPSY TEAR [place] Right 2012   Right lacrimal gland biopsy, chronic inflammation, no malignancy  . BUNIONECTOMY  2000   Right  . LIPOMA EXCISION Right 11/22/2019   R arm    Allergies as of 04/02/2020   No Known Allergies     Medication List       Accurate as of April 02, 2020  2:48 PM. If you have any questions, ask your nurse or doctor.        cholecalciferol 25 MCG (1000 UNIT) tablet Commonly known as: VITAMIN D3 Take 1,000 Units by mouth daily.   cyclobenzaprine 10 MG tablet Commonly known as: FLEXERIL Take 1 tablet (10 mg total) by mouth 2 (two) times daily as needed for muscle spasms.   meloxicam 15 MG tablet Commonly known as: MOBIC Take 15 mg by mouth daily.   methocarbamol 500 MG tablet Commonly known as: ROBAXIN Take 500 mg by mouth every 6 (six) hours as needed.   predniSONE 10 MG tablet Commonly known as: DELTASONE 4 tablets x 2 days, 3 tabs x 2 days, 2 tabs x 2 days, 1 tab x 2 days          Objective:   Physical Exam BP 134/81 (BP Location: Right Arm, Patient Position: Sitting, Cuff Size: Small)   Pulse 77   Temp 98.4 F (36.9 C) (Oral)   Resp 16   Ht 5\' 2"  (1.575 m)   Wt 130 lb 6 oz (59.1 kg)   LMP 07/15/2012   SpO2 98%   BMI 23.85 kg/m       General:   Well developed, NAD, BMI noted. HEENT:  Normocephalic . Face symmetric, atraumatic MSK: Hips: No deformities, normal rotation, symmetric except for mild TTP at the external side of the left hip associated with some nodularity at the SQ tissue Skin: Not pale. Not jaundice Neurologic:  alert & oriented X3.  Speech normal, gait appropriate for age and unassisted Psych--  Cognition and judgment appear intact.  Cooperative with normal attention span and concentration.  Behavior appropriate. No anxious or depressed appearing.    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 Hip contusion, L:  Pt concerned about discomfort and a SQ  nodularity at the area, findings c/w contusion, and likely a resolving hematoma. Recent x-ray negative, rec observation, patient reassured.  Call if no better  This visit occurred during the SARS-CoV-2 public health emergency.  Safety protocols were in place, including screening questions prior to the visit, additional usage of staff PPE, and extensive cleaning  of exam room while observing appropriate contact time as indicated for disinfecting solutions.

## 2020-04-04 DIAGNOSIS — M545 Low back pain: Secondary | ICD-10-CM | POA: Diagnosis not present

## 2020-04-04 NOTE — Assessment & Plan Note (Signed)
Fall 03/15/2020, dx w/ a  left hand fracture, has seen Ortho twice, next visit 04/14/2020. Hip contusion, L:  Pt concerned about discomfort and a SQ  nodularity at the area, findings c/w contusion, and likely a resolving hematoma. Recent x-ray negative, rec observation, patient reassured.  Call if no better

## 2020-04-11 DIAGNOSIS — M7062 Trochanteric bursitis, left hip: Secondary | ICD-10-CM | POA: Diagnosis not present

## 2020-04-14 DIAGNOSIS — S62302D Unspecified fracture of third metacarpal bone, right hand, subsequent encounter for fracture with routine healing: Secondary | ICD-10-CM | POA: Diagnosis not present

## 2020-04-14 DIAGNOSIS — M545 Low back pain, unspecified: Secondary | ICD-10-CM | POA: Diagnosis not present

## 2020-04-15 DIAGNOSIS — M25642 Stiffness of left hand, not elsewhere classified: Secondary | ICD-10-CM | POA: Diagnosis not present

## 2020-04-15 DIAGNOSIS — S62301D Unspecified fracture of second metacarpal bone, left hand, subsequent encounter for fracture with routine healing: Secondary | ICD-10-CM | POA: Diagnosis not present

## 2020-04-19 DIAGNOSIS — G8911 Acute pain due to trauma: Secondary | ICD-10-CM | POA: Diagnosis not present

## 2020-04-19 DIAGNOSIS — M25552 Pain in left hip: Secondary | ICD-10-CM | POA: Diagnosis not present

## 2020-04-19 DIAGNOSIS — Z043 Encounter for examination and observation following other accident: Secondary | ICD-10-CM | POA: Diagnosis not present

## 2020-04-19 DIAGNOSIS — S7002XA Contusion of left hip, initial encounter: Secondary | ICD-10-CM | POA: Diagnosis not present

## 2020-04-19 DIAGNOSIS — W010XXA Fall on same level from slipping, tripping and stumbling without subsequent striking against object, initial encounter: Secondary | ICD-10-CM | POA: Diagnosis not present

## 2020-04-21 DIAGNOSIS — M5416 Radiculopathy, lumbar region: Secondary | ICD-10-CM | POA: Diagnosis not present

## 2020-04-22 DIAGNOSIS — S62301A Unspecified fracture of second metacarpal bone, left hand, initial encounter for closed fracture: Secondary | ICD-10-CM | POA: Diagnosis not present

## 2020-04-23 DIAGNOSIS — M25642 Stiffness of left hand, not elsewhere classified: Secondary | ICD-10-CM | POA: Diagnosis not present

## 2020-04-23 DIAGNOSIS — S62301D Unspecified fracture of second metacarpal bone, left hand, subsequent encounter for fracture with routine healing: Secondary | ICD-10-CM | POA: Diagnosis not present

## 2020-04-25 DIAGNOSIS — M25642 Stiffness of left hand, not elsewhere classified: Secondary | ICD-10-CM | POA: Diagnosis not present

## 2020-04-25 DIAGNOSIS — S62301D Unspecified fracture of second metacarpal bone, left hand, subsequent encounter for fracture with routine healing: Secondary | ICD-10-CM | POA: Diagnosis not present

## 2020-04-28 DIAGNOSIS — R1012 Left upper quadrant pain: Secondary | ICD-10-CM | POA: Diagnosis not present

## 2020-04-28 DIAGNOSIS — R0789 Other chest pain: Secondary | ICD-10-CM | POA: Diagnosis not present

## 2020-04-28 DIAGNOSIS — K5730 Diverticulosis of large intestine without perforation or abscess without bleeding: Secondary | ICD-10-CM | POA: Diagnosis not present

## 2020-04-28 DIAGNOSIS — R079 Chest pain, unspecified: Secondary | ICD-10-CM | POA: Diagnosis not present

## 2020-04-28 DIAGNOSIS — R1032 Left lower quadrant pain: Secondary | ICD-10-CM | POA: Diagnosis not present

## 2020-04-28 DIAGNOSIS — R109 Unspecified abdominal pain: Secondary | ICD-10-CM | POA: Diagnosis not present

## 2020-04-29 DIAGNOSIS — M25642 Stiffness of left hand, not elsewhere classified: Secondary | ICD-10-CM | POA: Diagnosis not present

## 2020-04-29 DIAGNOSIS — S62301D Unspecified fracture of second metacarpal bone, left hand, subsequent encounter for fracture with routine healing: Secondary | ICD-10-CM | POA: Diagnosis not present

## 2020-04-29 DIAGNOSIS — K5730 Diverticulosis of large intestine without perforation or abscess without bleeding: Secondary | ICD-10-CM | POA: Diagnosis not present

## 2020-04-29 DIAGNOSIS — R109 Unspecified abdominal pain: Secondary | ICD-10-CM | POA: Diagnosis not present

## 2020-04-30 DIAGNOSIS — D1724 Benign lipomatous neoplasm of skin and subcutaneous tissue of left leg: Secondary | ICD-10-CM | POA: Diagnosis not present

## 2020-05-02 ENCOUNTER — Ambulatory Visit (INDEPENDENT_AMBULATORY_CARE_PROVIDER_SITE_OTHER): Payer: BC Managed Care – PPO | Admitting: Family Medicine

## 2020-05-02 ENCOUNTER — Encounter: Payer: Self-pay | Admitting: Family Medicine

## 2020-05-02 ENCOUNTER — Other Ambulatory Visit: Payer: Self-pay

## 2020-05-02 VITALS — BP 130/78 | HR 83 | Temp 98.3°F | Resp 18 | Ht 62.0 in | Wt 131.0 lb

## 2020-05-02 DIAGNOSIS — K5901 Slow transit constipation: Secondary | ICD-10-CM | POA: Diagnosis not present

## 2020-05-02 DIAGNOSIS — M25642 Stiffness of left hand, not elsewhere classified: Secondary | ICD-10-CM | POA: Diagnosis not present

## 2020-05-02 DIAGNOSIS — K5909 Other constipation: Secondary | ICD-10-CM | POA: Diagnosis not present

## 2020-05-02 DIAGNOSIS — R1032 Left lower quadrant pain: Secondary | ICD-10-CM

## 2020-05-02 DIAGNOSIS — S62301D Unspecified fracture of second metacarpal bone, left hand, subsequent encounter for fracture with routine healing: Secondary | ICD-10-CM | POA: Diagnosis not present

## 2020-05-02 LAB — POC URINALSYSI DIPSTICK (AUTOMATED)
Bilirubin, UA: NEGATIVE
Blood, UA: NEGATIVE
Glucose, UA: NEGATIVE
Ketones, UA: NEGATIVE
Leukocytes, UA: NEGATIVE
Nitrite, UA: NEGATIVE
Protein, UA: NEGATIVE
Spec Grav, UA: >=1.03 — AB (ref 1.010–1.025)
Urobilinogen, UA: 0.2 E.U./dL
pH, UA: 6 (ref 5.0–8.0)

## 2020-05-02 MED ORDER — TRULANCE 3 MG PO TABS: ORAL_TABLET | ORAL | 2 refills | Status: DC

## 2020-05-02 NOTE — Patient Instructions (Signed)

## 2020-05-02 NOTE — Progress Notes (Signed)
Patient ID: Karen Case, female    DOB: 1966/06/26  Age: 54 y.o. MRN: [id number]    Subjective:  Subjective  HPI  Karen Case presents for er for L low pain --- ct diverticulosis   Otherwise labs and ct normal.  She also c/o constipation for years ---- last bm  -- Wednesday but not normal    She tried miralax and ducolax with no relief  Review of Systems  Constitutional: Negative for appetite change, diaphoresis, fatigue and unexpected weight change.  Eyes: Negative for pain, redness and visual disturbance.  Respiratory: Negative for cough, chest tightness, shortness of breath and wheezing.   Cardiovascular: Negative for chest pain, palpitations and leg swelling.  Gastrointestinal: Positive for constipation. Negative for diarrhea.  Endocrine: Negative for cold intolerance, heat intolerance, polydipsia, polyphagia and polyuria.  Genitourinary: Negative for difficulty urinating, dysuria and frequency.  Neurological: Negative for dizziness, light-headedness, numbness and headaches.    History Past Medical History:  Diagnosis Date  . Birth control    husband's vasectomy  . Chronic constipation 06/30/2011  . GERD (gastroesophageal reflux disease)     She has a past surgical history that includes Bunionectomy (2000); Biopsy tear sac (Right, 2012); and Lipoma excision (Right, 11/22/2019).   Her family history includes Heart attack in her father; Hypertension in her mother.She reports that she has never smoked. She has never used smokeless tobacco. She reports current alcohol use. She reports that she does not use drugs.  Current Outpatient Medications on File Prior to Visit  Medication Sig Dispense Refill  . cholecalciferol (VITAMIN D3) 25 MCG (1000 UT) tablet Take 1,000 Units by mouth daily.    . meloxicam (MOBIC) 15 MG tablet Take 15 mg by mouth daily.    . methocarbamol (ROBAXIN) 500 MG tablet Take 500 mg by mouth every 6 (six) hours as needed.     No current  facility-administered medications on file prior to visit.     Objective:  Objective  Physical Exam Vitals and nursing note reviewed.  Constitutional:      Appearance: She is well-developed.  HENT:     Head: Normocephalic and atraumatic.  Eyes:     Conjunctiva/sclera: Conjunctivae normal.  Neck:     Thyroid: No thyromegaly.     Vascular: No carotid bruit or JVD.  Cardiovascular:     Rate and Rhythm: Normal rate and regular rhythm.     Heart sounds: Normal heart sounds. No murmur heard.   Pulmonary:     Effort: Pulmonary effort is normal. No respiratory distress.     Breath sounds: Normal breath sounds. No wheezing or rales.  Chest:     Chest wall: No tenderness.  Abdominal:     Tenderness: There is abdominal tenderness in the left lower quadrant. There is no right CVA tenderness, left CVA tenderness, guarding or rebound. Negative signs include Murphy's sign, Rovsing's sign, McBurney's sign, psoas sign and obturator sign.     Comments: Min tenderness Soft abd   Musculoskeletal:     Cervical back: Normal range of motion and neck supple.  Neurological:     Mental Status: She is alert and oriented to person, place, and time.    BP 130/78 (BP Location: Right Arm, Patient Position: Sitting, Cuff Size: Normal)   Pulse 83   Temp 98.3 F (36.8 C) (Oral)   Resp 18   Ht 5\' 2"  (1.575 m)   Wt 131 lb (59.4 kg)   LMP 07/15/2012   SpO2 99%  BMI 23.96 kg/m  Wt Readings from Last 3 Encounters:  05/02/20 131 lb (59.4 kg)  04/02/20 130 lb 6 oz (59.1 kg)  03/03/20 130 lb 4 oz (59.1 kg)     Lab Results  Component Value Date   WBC 5.0 10/09/2019   HGB 13.6 10/09/2019   HCT 39.4 10/09/2019   PLT 365.0 10/09/2019   GLUCOSE 97 12/21/2018   CHOL 174 10/09/2019   TRIG 95.0 10/09/2019   HDL 44.70 10/09/2019   LDLCALC 111 (H) 10/09/2019   ALT 20 12/21/2018   AST 17 12/21/2018   NA 140 12/21/2018   K 4.2 12/21/2018   CL 105 12/21/2018   CREATININE 0.66 12/21/2018   BUN 18  12/21/2018   CO2 28 12/21/2018   TSH 1.14 12/21/2018   INR 1.0 10/27/2018    DG Lumbar Spine Complete  Result Date: 09/01/2019 CLINICAL DATA:  Lumbosacral back pain radiating down right leg to ankle. Pain for 1 week. EXAM: LUMBAR SPINE - COMPLETE 4+ VIEW COMPARISON:  None. FINDINGS: Trace broad-based rightward curvature of the upper lumbar spine may be positional or mild scoliosis. Alignment is otherwise normal. Vertebral body heights are normal. There is no listhesis. The posterior elements are intact. Disc spaces are preserved. No fracture. Sacroiliac joints are symmetric and normal. IMPRESSION: Trace broad-based rightward curvature of the upper lumbar spine may be positional or mild scoliosis. Otherwise negative radiographs of the lumbar spine. Electronically Signed   By: Keith Rake M.D.   On: 09/01/2019 20:07     Assessment & Plan:  Plan  I have discontinued Eddye I. Selders's cyclobenzaprine and predniSONE. I am also having her start on Trulance. Additionally, I am having her maintain her cholecalciferol, meloxicam, and methocarbamol.  Meds ordered this encounter  Medications  . Plecanatide (TRULANCE) 3 MG TABS    Sig: 1 po qd    Dispense:  30 tablet    Refill:  2    Problem List Items Addressed This Visit    None    Visit Diagnoses    Slow transit constipation    -  Primary   Relevant Medications   Plecanatide (TRULANCE) 3 MG TABS   Other Relevant Orders   Ambulatory referral to Gastroenterology   CBC with Differential/Platelet   Comprehensive metabolic panel   TSH   Left lower quadrant abdominal pain       Relevant Orders   POCT Urinalysis Dipstick (Automated) (Completed)   CBC with Differential/Platelet   Comprehensive metabolic panel   TSH      Follow-up: Return if symptoms worsen or fail to improve.  Ann Held, DO

## 2020-05-02 NOTE — Assessment & Plan Note (Signed)
F/u GI No relief with fluids, miralax or ducolax Er records/ labs reviewed  D/c bentyl-- making constipation worse  trulance per orders Pt ins would not cover amitiza or linzess  Check labs

## 2020-05-03 LAB — CBC WITH DIFFERENTIAL/PLATELET
Absolute Monocytes: 294 cells/uL (ref 200–950)
Basophils Absolute: 39 cells/uL (ref 0–200)
Basophils Relative: 0.8 %
Eosinophils Absolute: 93 cells/uL (ref 15–500)
Eosinophils Relative: 1.9 %
HCT: 38.5 % (ref 35.0–45.0)
Hemoglobin: 12.6 g/dL (ref 11.7–15.5)
Lymphs Abs: 1362 cells/uL (ref 850–3900)
MCH: 31.8 pg (ref 27.0–33.0)
MCHC: 32.7 g/dL (ref 32.0–36.0)
MCV: 97.2 fL (ref 80.0–100.0)
MPV: 9.2 fL (ref 7.5–12.5)
Monocytes Relative: 6 %
Neutro Abs: 3112 cells/uL (ref 1500–7800)
Neutrophils Relative %: 63.5 %
Platelets: 385 10*3/uL (ref 140–400)
RBC: 3.96 10*6/uL (ref 3.80–5.10)
RDW: 11.3 % (ref 11.0–15.0)
Total Lymphocyte: 27.8 %
WBC: 4.9 10*3/uL (ref 3.8–10.8)

## 2020-05-03 LAB — COMPREHENSIVE METABOLIC PANEL
AG Ratio: 1.8 (calc) (ref 1.0–2.5)
ALT: 18 U/L (ref 6–29)
AST: 15 U/L (ref 10–35)
Albumin: 4.3 g/dL (ref 3.6–5.1)
Alkaline phosphatase (APISO): 79 U/L (ref 37–153)
BUN: 13 mg/dL (ref 7–25)
CO2: 31 mmol/L (ref 20–32)
Calcium: 9.4 mg/dL (ref 8.6–10.4)
Chloride: 103 mmol/L (ref 98–110)
Creat: 0.76 mg/dL (ref 0.50–1.05)
Globulin: 2.4 g/dL (calc) (ref 1.9–3.7)
Glucose, Bld: 95 mg/dL (ref 65–99)
Potassium: 4.3 mmol/L (ref 3.5–5.3)
Sodium: 139 mmol/L (ref 135–146)
Total Bilirubin: 0.7 mg/dL (ref 0.2–1.2)
Total Protein: 6.7 g/dL (ref 6.1–8.1)

## 2020-05-03 LAB — TSH: TSH: 0.98 mIU/L

## 2020-05-06 DIAGNOSIS — S62301D Unspecified fracture of second metacarpal bone, left hand, subsequent encounter for fracture with routine healing: Secondary | ICD-10-CM | POA: Diagnosis not present

## 2020-05-06 DIAGNOSIS — M25642 Stiffness of left hand, not elsewhere classified: Secondary | ICD-10-CM | POA: Diagnosis not present

## 2020-05-09 DIAGNOSIS — M25642 Stiffness of left hand, not elsewhere classified: Secondary | ICD-10-CM | POA: Diagnosis not present

## 2020-05-09 DIAGNOSIS — S62301D Unspecified fracture of second metacarpal bone, left hand, subsequent encounter for fracture with routine healing: Secondary | ICD-10-CM | POA: Diagnosis not present

## 2020-05-12 DIAGNOSIS — S62302D Unspecified fracture of third metacarpal bone, right hand, subsequent encounter for fracture with routine healing: Secondary | ICD-10-CM | POA: Diagnosis not present

## 2020-05-12 DIAGNOSIS — S7012XA Contusion of left thigh, initial encounter: Secondary | ICD-10-CM | POA: Diagnosis not present

## 2020-05-13 DIAGNOSIS — M25642 Stiffness of left hand, not elsewhere classified: Secondary | ICD-10-CM | POA: Diagnosis not present

## 2020-05-13 DIAGNOSIS — S62301D Unspecified fracture of second metacarpal bone, left hand, subsequent encounter for fracture with routine healing: Secondary | ICD-10-CM | POA: Diagnosis not present

## 2020-05-15 DIAGNOSIS — M5416 Radiculopathy, lumbar region: Secondary | ICD-10-CM | POA: Diagnosis not present

## 2020-05-16 ENCOUNTER — Other Ambulatory Visit: Payer: Self-pay | Admitting: Internal Medicine

## 2020-05-16 DIAGNOSIS — Z1231 Encounter for screening mammogram for malignant neoplasm of breast: Secondary | ICD-10-CM

## 2020-05-16 DIAGNOSIS — S62301D Unspecified fracture of second metacarpal bone, left hand, subsequent encounter for fracture with routine healing: Secondary | ICD-10-CM | POA: Diagnosis not present

## 2020-05-16 DIAGNOSIS — M25642 Stiffness of left hand, not elsewhere classified: Secondary | ICD-10-CM | POA: Diagnosis not present

## 2020-05-20 DIAGNOSIS — M25642 Stiffness of left hand, not elsewhere classified: Secondary | ICD-10-CM | POA: Diagnosis not present

## 2020-05-20 DIAGNOSIS — S62301D Unspecified fracture of second metacarpal bone, left hand, subsequent encounter for fracture with routine healing: Secondary | ICD-10-CM | POA: Diagnosis not present

## 2020-05-21 DIAGNOSIS — S62301D Unspecified fracture of second metacarpal bone, left hand, subsequent encounter for fracture with routine healing: Secondary | ICD-10-CM | POA: Diagnosis not present

## 2020-05-21 DIAGNOSIS — M25642 Stiffness of left hand, not elsewhere classified: Secondary | ICD-10-CM | POA: Diagnosis not present

## 2020-05-26 DIAGNOSIS — M25552 Pain in left hip: Secondary | ICD-10-CM | POA: Diagnosis not present

## 2020-05-26 DIAGNOSIS — M7062 Trochanteric bursitis, left hip: Secondary | ICD-10-CM | POA: Diagnosis not present

## 2020-05-27 DIAGNOSIS — S62301D Unspecified fracture of second metacarpal bone, left hand, subsequent encounter for fracture with routine healing: Secondary | ICD-10-CM | POA: Diagnosis not present

## 2020-05-27 DIAGNOSIS — M25642 Stiffness of left hand, not elsewhere classified: Secondary | ICD-10-CM | POA: Diagnosis not present

## 2020-05-30 DIAGNOSIS — M5416 Radiculopathy, lumbar region: Secondary | ICD-10-CM | POA: Diagnosis not present

## 2020-06-02 DIAGNOSIS — S62301D Unspecified fracture of second metacarpal bone, left hand, subsequent encounter for fracture with routine healing: Secondary | ICD-10-CM | POA: Diagnosis not present

## 2020-06-02 DIAGNOSIS — M25642 Stiffness of left hand, not elsewhere classified: Secondary | ICD-10-CM | POA: Diagnosis not present

## 2020-06-03 DIAGNOSIS — M25642 Stiffness of left hand, not elsewhere classified: Secondary | ICD-10-CM | POA: Diagnosis not present

## 2020-06-03 DIAGNOSIS — S62301D Unspecified fracture of second metacarpal bone, left hand, subsequent encounter for fracture with routine healing: Secondary | ICD-10-CM | POA: Diagnosis not present

## 2020-06-09 DIAGNOSIS — S62302D Unspecified fracture of third metacarpal bone, right hand, subsequent encounter for fracture with routine healing: Secondary | ICD-10-CM | POA: Diagnosis not present

## 2020-06-10 DIAGNOSIS — M25642 Stiffness of left hand, not elsewhere classified: Secondary | ICD-10-CM | POA: Diagnosis not present

## 2020-06-10 DIAGNOSIS — S62301D Unspecified fracture of second metacarpal bone, left hand, subsequent encounter for fracture with routine healing: Secondary | ICD-10-CM | POA: Diagnosis not present

## 2020-06-11 DIAGNOSIS — M25552 Pain in left hip: Secondary | ICD-10-CM | POA: Diagnosis not present

## 2020-06-12 DIAGNOSIS — M25642 Stiffness of left hand, not elsewhere classified: Secondary | ICD-10-CM | POA: Diagnosis not present

## 2020-06-12 DIAGNOSIS — S62301D Unspecified fracture of second metacarpal bone, left hand, subsequent encounter for fracture with routine healing: Secondary | ICD-10-CM | POA: Diagnosis not present

## 2020-06-13 ENCOUNTER — Ambulatory Visit (INDEPENDENT_AMBULATORY_CARE_PROVIDER_SITE_OTHER): Payer: BC Managed Care – PPO | Admitting: Nurse Practitioner

## 2020-06-13 VITALS — BP 108/64 | HR 70 | Ht 63.0 in | Wt 136.8 lb

## 2020-06-13 DIAGNOSIS — Z86010 Personal history of colonic polyps: Secondary | ICD-10-CM | POA: Diagnosis not present

## 2020-06-13 DIAGNOSIS — K5909 Other constipation: Secondary | ICD-10-CM

## 2020-06-13 MED ORDER — LINACLOTIDE 145 MCG PO CAPS: 145.0000 ug | ORAL_CAPSULE | Freq: Every day | ORAL | 1 refills | Status: DC

## 2020-06-13 NOTE — Progress Notes (Signed)
.    06/13/2020 Karen Case [id number] [birth year]   CHIEF COMPLAINT: Chronic constipation  HISTORY OF PRESENT ILLNESS: Karen Case is a 54 year old female with a past medical history of arthritis, GERD and chronic constipation. Right bunionectomy 2000. She presents to our office today as referred by Dr. Roma Schanz for further evaluation regarding chronic constipation.  She was prescribed Trulance 3mg  once daily mid October which resulted in passing a normal BM daily  for 1 to 2 weeks then became less effective.  She stopped taking the Trulance and on Monday 11/29.  She can go 1 week without passing a bowel movement if she does not take a laxative. She previously took MiraLAX daily which was ineffective.  She previously took Dulcolax every other day for 2 months which resulted in passing a BM most days but resulted in abdominal pain and cramping so she stopped taking it.  She took magnesium citrate one half bottle a few days ago which results in passing a large amount of solid stool.  She is concerned regarding an episode of left mid abdominal and chest pain which occurred on 04/29/2020.  She presented to [facility] ED for further evaluation at that time. A 12 lead EKG and chest x-ray were normal.  An abdominal/pelvic CT scan showed mild colonic diverticulosis without evidence of diverticulitis.  WBC 7.3.  Hemoglobin 12.5.  CMP was normal.  She was prescribed Dicyclomine 20 mg 3 times daily for her abdominal pain.  She denies having any further chest or abdominal pain since presenting to the ED.  No prior history of diverticulitis.  She underwent a colonoscopy by Dr. Fuller Plan 09/29/2017 which identified for tubular adenomatous and hyperplastic polyps which were removed from the transverse colon, at the hepatic flexure and cecum.  Diverticulosis to the left colon was noted.  She was advised to repeat a colonoscopy March 2022.  No family history of esophageal, gastric or colon cancer.  She  has a history of GERD which has resolved.  She denies having any GERD symptoms for the past year.  She has gained 6 pounds over the past 3 months which is bothersome to her.  No other complaints at this time.   CBC Latest Ref Rng & Units 05/02/2020 10/09/2019 12/21/2018  WBC 3.8 - 10.8 Thousand/uL 4.9 5.0 4.6  Hemoglobin 11.7 - 15.5 g/dL 12.6 13.6 12.5  Hematocrit 35 - 45 % 38.5 39.4 37.6  Platelets 140 - 400 Thousand/uL 385 365.0 325    CMP Latest Ref Rng & Units 05/02/2020 12/21/2018 10/27/2018  Glucose 65 - 99 mg/dL 95 97 -  BUN 7 - 25 mg/dL 13 18 -  Creatinine 0.50 - 1.05 mg/dL 0.76 0.66 0.60  Sodium 135 - 146 mmol/L 139 140 -  Potassium 3.5 - 5.3 mmol/L 4.3 4.2 -  Chloride 98 - 110 mmol/L 103 105 -  CO2 20 - 32 mmol/L 31 28 -  Calcium 8.6 - 10.4 mg/dL 9.4 9.0 -  Total Protein 6.1 - 8.1 g/dL 6.7 6.2 -  Total Bilirubin 0.2 - 1.2 mg/dL 0.7 0.7 -  Alkaline Phos 38 - 126 U/L - - -  AST 10 - 35 U/L 15 17 -  ALT 6 - 29 U/L 18 20 -  TSH 0.98.  Colonoscopy in 09/29/2017  - Four 5 to 7 mm polyps in the transverse colon, at the hepatic flexure and in the cecum, removed with a cold snare. Resected and retrieved. - Diverticulosis in the left colon. -  The examination was otherwise normal on direct and retroflexion views. -3 year recall TUBULAR ADENOMA(S) WITHOUT HIGH GRADE DYSPLASIA OR MALIGNANCY. - HYPERPLASTIC POLYP.   Past Medical History:  Diagnosis Date  . Birth control    husband's vasectomy  . Chronic constipation 06/30/2011  . GERD (gastroesophageal reflux disease)    Past Surgical History:  Procedure Laterality Date  . BIOPSY TEAR [place] Right 2012   Right lacrimal gland biopsy, chronic inflammation, no malignancy  . BUNIONECTOMY  2000   Right  . LIPOMA EXCISION Right 11/22/2019   R arm   Social History: Nonsmoker.   Family History: Mother age 2 HTN. Father's history is unknown. Three sisters, one sister with thyroid disease.    reports that she has never smoked. She  has never used smokeless tobacco. She reports current alcohol use. She reports that she does not use drugs. family history includes Heart attack in her father; Hypertension in her mother. No Known Allergies    Outpatient Encounter Medications as of 06/13/2020  Medication Sig  . cholecalciferol (VITAMIN D3) 25 MCG (1000 UT) tablet Take 1,000 Units by mouth daily.  . meloxicam (MOBIC) 15 MG tablet Take 15 mg by mouth daily.  . methocarbamol (ROBAXIN) 500 MG tablet Take 500 mg by mouth every 6 (six) hours as needed.  Marland Kitchen Plecanatide (TRULANCE) 3 MG TABS 1 po qd   No facility-administered encounter medications on file as of 06/13/2020.    REVIEW OF SYSTEMS:  Gen: Denies fever, sweats or chills. No weight loss.  CV: Denies chest pain, palpitations or edema. Resp: Denies cough, shortness of breath of hemoptysis.  GI: See HPI. GU : Denies urinary burning, blood in urine, increased urinary frequency or incontinence. MS: Denies joint pain, muscles aches or weakness. Derm: Denies rash, itchiness, skin lesions or unhealing ulcers. Psych: Denies depression, anxiety or memory loss. Heme: Denies bruising, bleeding. Neuro:  Denies headaches, dizziness or paresthesias. Endo:  Denies any problems with DM, thyroid or adrenal function.   PHYSICAL EXAM: LMP 07/15/2012   BP 108/64   Pulse 70   Ht 5\' 3"  (1.6 m)   Wt 136 lb 12.8 oz (62.1 kg)   LMP 07/15/2012   BMI 24.23 kg/m  General: Well developed  54 year old female in no acute distress. Head: Normocephalic and atraumatic. Eyes:  Sclerae non-icteric, conjunctive pink. Ears: Normal auditory acuity. Mouth: Dentition intact. No ulcers or lesions.  Neck: Supple, no lymphadenopathy or thyromegaly.  Lungs: Clear bilaterally to auscultation without wheezes, crackles or rhonchi. Heart: Regular rate and rhythm. No murmur, rub or gallop appreciated.  Abdomen: Soft, non distended.  Mild tenderness about 3 cm above the umbilicus without rebound or  guarding.  No masses. No hepatosplenomegaly. Normoactive bowel sounds x 4 quadrants.  Rectal: Furred. Musculoskeletal: Symmetrical with no gross deformities. Skin: Warm and dry. No rash or lesions on visible extremities. Extremities: No edema. Neurological: Alert oriented x 4, no focal deficits.  Psychological:  Alert and cooperative. Normal mood and affect.  ASSESSMENT AND PLAN:  86.  54 year old female with chronic constipation -Linzess 145 mcg 1 p.o. daily to be taken 30 minutes before breakfast -Gas-X 1 tab p.o. twice daily as needed -Probiotic of choice once daily -Follow-up in the office in 6 weeks, patient to call if her symptoms persist or worsen -Patient to call our office if her abdominal pain recurs  2.  History of GERD, asymptomatic  3.  History of tubular adenomatous and hyperplastic polyps. -Next colonoscopy due March 2022 -To  schedule colonoscopy at time of next follow-up appointment    CC:  Colon Branch, MD

## 2020-06-13 NOTE — Progress Notes (Signed)
Reviewed and agree with management plan.  Bridie Colquhoun T. Serenna Deroy, MD FACG [facility]  

## 2020-06-13 NOTE — Patient Instructions (Addendum)
We have sent the following medications to your pharmacy for you to pick up at your convenience: Linzess   Use Gas X - take 1 capsule by mouth twice daily.   Use Probiotic of choice - once daily. Probiotics can be purchased over the counter.   Please keep follow-up with Dr. Fuller Plan on 08/08/20  to discuss scheduling Colonoscopy in March of 2022.    If you are age 53 or younger, your body mass index should be between 19-25. Your Body mass index is 24.23 kg/m. If this is out of the aformentioned range listed, please consider follow up with your Primary Care Provider.    Thank you for choosing me and [facility].  [place]

## 2020-06-18 DIAGNOSIS — M25642 Stiffness of left hand, not elsewhere classified: Secondary | ICD-10-CM | POA: Diagnosis not present

## 2020-06-18 DIAGNOSIS — S62301D Unspecified fracture of second metacarpal bone, left hand, subsequent encounter for fracture with routine healing: Secondary | ICD-10-CM | POA: Diagnosis not present

## 2020-06-19 DIAGNOSIS — M25642 Stiffness of left hand, not elsewhere classified: Secondary | ICD-10-CM | POA: Diagnosis not present

## 2020-06-19 DIAGNOSIS — S62301D Unspecified fracture of second metacarpal bone, left hand, subsequent encounter for fracture with routine healing: Secondary | ICD-10-CM | POA: Diagnosis not present

## 2020-06-24 DIAGNOSIS — S62301D Unspecified fracture of second metacarpal bone, left hand, subsequent encounter for fracture with routine healing: Secondary | ICD-10-CM | POA: Diagnosis not present

## 2020-06-24 DIAGNOSIS — M25642 Stiffness of left hand, not elsewhere classified: Secondary | ICD-10-CM | POA: Diagnosis not present

## 2020-06-27 ENCOUNTER — Other Ambulatory Visit: Payer: Self-pay

## 2020-06-27 ENCOUNTER — Ambulatory Visit
Admission: RE | Admit: 2020-06-27 | Discharge: 2020-06-27 | Disposition: A | Discharge status: Home / Self Care | Payer: BC Managed Care – PPO | Source: Ambulatory Visit | Attending: Internal Medicine | Admitting: Internal Medicine

## 2020-06-27 DIAGNOSIS — Z1231 Encounter for screening mammogram for malignant neoplasm of breast: Secondary | ICD-10-CM

## 2020-07-01 DIAGNOSIS — M25642 Stiffness of left hand, not elsewhere classified: Secondary | ICD-10-CM | POA: Diagnosis not present

## 2020-07-01 DIAGNOSIS — S62301D Unspecified fracture of second metacarpal bone, left hand, subsequent encounter for fracture with routine healing: Secondary | ICD-10-CM | POA: Diagnosis not present

## 2020-07-15 DIAGNOSIS — S62301D Unspecified fracture of second metacarpal bone, left hand, subsequent encounter for fracture with routine healing: Secondary | ICD-10-CM | POA: Diagnosis not present

## 2020-07-15 DIAGNOSIS — M25642 Stiffness of left hand, not elsewhere classified: Secondary | ICD-10-CM | POA: Diagnosis not present

## 2020-07-18 DIAGNOSIS — S62302D Unspecified fracture of third metacarpal bone, right hand, subsequent encounter for fracture with routine healing: Secondary | ICD-10-CM | POA: Diagnosis not present

## 2020-07-18 DIAGNOSIS — M25642 Stiffness of left hand, not elsewhere classified: Secondary | ICD-10-CM | POA: Diagnosis not present

## 2020-07-18 DIAGNOSIS — S62301D Unspecified fracture of second metacarpal bone, left hand, subsequent encounter for fracture with routine healing: Secondary | ICD-10-CM | POA: Diagnosis not present

## 2020-07-22 DIAGNOSIS — M5416 Radiculopathy, lumbar region: Secondary | ICD-10-CM | POA: Diagnosis not present

## 2020-07-25 DIAGNOSIS — M25642 Stiffness of left hand, not elsewhere classified: Secondary | ICD-10-CM | POA: Diagnosis not present

## 2020-07-25 DIAGNOSIS — S62301D Unspecified fracture of second metacarpal bone, left hand, subsequent encounter for fracture with routine healing: Secondary | ICD-10-CM | POA: Diagnosis not present

## 2020-07-29 DIAGNOSIS — S62301D Unspecified fracture of second metacarpal bone, left hand, subsequent encounter for fracture with routine healing: Secondary | ICD-10-CM | POA: Diagnosis not present

## 2020-07-29 DIAGNOSIS — M25642 Stiffness of left hand, not elsewhere classified: Secondary | ICD-10-CM | POA: Diagnosis not present

## 2020-08-05 DIAGNOSIS — M25642 Stiffness of left hand, not elsewhere classified: Secondary | ICD-10-CM | POA: Diagnosis not present

## 2020-08-05 DIAGNOSIS — S62301D Unspecified fracture of second metacarpal bone, left hand, subsequent encounter for fracture with routine healing: Secondary | ICD-10-CM | POA: Diagnosis not present

## 2020-08-07 ENCOUNTER — Other Ambulatory Visit: Payer: Self-pay | Admitting: Nurse Practitioner

## 2020-08-08 ENCOUNTER — Encounter: Payer: Self-pay | Admitting: Gastroenterology

## 2020-08-08 ENCOUNTER — Other Ambulatory Visit: Payer: Self-pay

## 2020-08-08 ENCOUNTER — Ambulatory Visit (INDEPENDENT_AMBULATORY_CARE_PROVIDER_SITE_OTHER): Payer: BC Managed Care – PPO | Admitting: Gastroenterology

## 2020-08-08 VITALS — BP 110/58 | HR 80 | Ht 63.0 in | Wt 139.0 lb

## 2020-08-08 DIAGNOSIS — R6881 Early satiety: Secondary | ICD-10-CM | POA: Diagnosis not present

## 2020-08-08 DIAGNOSIS — K219 Gastro-esophageal reflux disease without esophagitis: Secondary | ICD-10-CM

## 2020-08-08 DIAGNOSIS — Z86010 Personal history of colonic polyps: Secondary | ICD-10-CM | POA: Diagnosis not present

## 2020-08-08 DIAGNOSIS — R1084 Generalized abdominal pain: Secondary | ICD-10-CM | POA: Diagnosis not present

## 2020-08-08 MED ORDER — PANTOPRAZOLE SODIUM 40 MG PO TBEC: 40.0000 mg | DELAYED_RELEASE_TABLET | Freq: Every day | ORAL | 11 refills | Status: DC

## 2020-08-08 MED ORDER — PLENVU 140 G PO SOLR: 1.0000 | Freq: Once | ORAL | 0 refills | Status: AC

## 2020-08-08 NOTE — Progress Notes (Signed)
    History of Present Illness: This is a 55 year old female returning for follow-up of constipation.  She relates new symptoms of generalized abdominal pain, generalized abdominal fullness, worsening reflux symptoms now on a daily basis and early satiety.  She states the Linzess generally produces a loose bowel movement within minutes or up to two hours after taking a dose.  She has gained 3 pounds since her last visit her in December and 8 pounds since her primary care visit in October 2021. Denies weight loss, diarrhea, change in stool caliber, melena, hematochezia, nausea, vomiting, dysphagia, chest pain.   Current Medications, Allergies, Past Medical History, Past Surgical History, Family History and Social History were reviewed in [employer] record.   Physical Exam: General: Well developed, well nourished, no acute distress Head: Normocephalic and atraumatic Eyes:  sclerae anicteric, EOMI Ears: Normal auditory acuity Mouth: Not examined, mask on during Covid-19 pandemic Lungs: Clear throughout to auscultation Heart: Regular rate and rhythm; no murmurs, rubs or bruits Abdomen: Soft, minimal generalized tenderness and non distended. No masses, hepatosplenomegaly or hernias noted. Normal Bowel sounds Rectal: Deferred to colonoscopy Musculoskeletal: Symmetrical with no gross deformities  Pulses:  Normal pulses noted Extremities: No clubbing, cyanosis, edema or deformities noted Neurological: Alert oriented x 4, grossly nonfocal Psychological:  Alert and cooperative. Normal mood and affect   Assessment and Recommendations:  1. CIC.  Reasonably well controlled with Linzess although it does lead to loose, urgent stools.  Continue Linzess 145 mcg daily for now.  2. GERD, early satiety, generalized abdominal pain, abdominal bloating.  Follow antireflux measures.  Begin pantoprazole 40 mg daily.  Further evaluation with EGD. The risks (including bleeding, perforation,  infection, missed lesions, medication reactions and possible hospitalization or surgery if complications occur), benefits, and alternatives to endoscopy with possible biopsy and possible dilation were discussed with the patient and they consent to proceed.  A component of her abdominal bloating could be related to weight gain over the past several months.  3. Personal history of adenomatous colon polyps. Surveillance colonoscopy is due March 2022.  She is due for surveillance colonoscopy.  Schedule colonoscopy. The risks (including bleeding, perforation, infection, missed lesions, medication reactions and possible hospitalization or surgery if complications occur), benefits, and alternatives to colonoscopy with possible biopsy and possible polypectomy were discussed with the patient and they consent to proceed.

## 2020-08-08 NOTE — Patient Instructions (Signed)
We have sent the following medications to your pharmacy for you to pick up at your convenience: pantoprazole.   Patient advised to avoid spicy, acidic, citrus, chocolate, mints, fruit and fruit juices.  Limit the intake of caffeine, alcohol and Soda.  Don't exercise too soon after eating.  Don't lie down within 3-4 hours of eating.  Elevate the head of your bed.  You have been scheduled for an endoscopy and colonoscopy. Please follow the written instructions given to you at your visit today. Please pick up your prep supplies at the pharmacy within the next 1-3 days. If you use inhalers (even only as needed), please bring them with you on the day of your procedure.  Thank you for choosing me and [facility].  Malcolm T. Stark, Jr., MD., FACG   

## 2020-08-11 DIAGNOSIS — S62301D Unspecified fracture of second metacarpal bone, left hand, subsequent encounter for fracture with routine healing: Secondary | ICD-10-CM | POA: Diagnosis not present

## 2020-08-11 DIAGNOSIS — M25642 Stiffness of left hand, not elsewhere classified: Secondary | ICD-10-CM | POA: Diagnosis not present

## 2020-08-11 DIAGNOSIS — M5416 Radiculopathy, lumbar region: Secondary | ICD-10-CM | POA: Diagnosis not present

## 2020-08-15 DIAGNOSIS — S62301D Unspecified fracture of second metacarpal bone, left hand, subsequent encounter for fracture with routine healing: Secondary | ICD-10-CM | POA: Diagnosis not present

## 2020-08-15 DIAGNOSIS — M5416 Radiculopathy, lumbar region: Secondary | ICD-10-CM | POA: Diagnosis not present

## 2020-08-15 DIAGNOSIS — M25642 Stiffness of left hand, not elsewhere classified: Secondary | ICD-10-CM | POA: Diagnosis not present

## 2020-08-15 DIAGNOSIS — M545 Low back pain, unspecified: Secondary | ICD-10-CM | POA: Diagnosis not present

## 2020-08-19 DIAGNOSIS — S62301D Unspecified fracture of second metacarpal bone, left hand, subsequent encounter for fracture with routine healing: Secondary | ICD-10-CM | POA: Diagnosis not present

## 2020-08-19 DIAGNOSIS — M25642 Stiffness of left hand, not elsewhere classified: Secondary | ICD-10-CM | POA: Diagnosis not present

## 2020-08-20 ENCOUNTER — Encounter: Payer: Self-pay | Admitting: Gastroenterology

## 2020-08-20 ENCOUNTER — Other Ambulatory Visit: Payer: Self-pay

## 2020-08-20 ENCOUNTER — Ambulatory Visit (AMBULATORY_SURGERY_CENTER): Payer: BC Managed Care – PPO | Admitting: Gastroenterology

## 2020-08-20 VITALS — BP 108/70 | HR 59 | Temp 98.6°F | Resp 22 | Ht 63.0 in | Wt 139.0 lb

## 2020-08-20 DIAGNOSIS — D122 Benign neoplasm of ascending colon: Secondary | ICD-10-CM | POA: Diagnosis not present

## 2020-08-20 DIAGNOSIS — K2950 Unspecified chronic gastritis without bleeding: Secondary | ICD-10-CM | POA: Diagnosis not present

## 2020-08-20 DIAGNOSIS — R1084 Generalized abdominal pain: Secondary | ICD-10-CM

## 2020-08-20 DIAGNOSIS — K449 Diaphragmatic hernia without obstruction or gangrene: Secondary | ICD-10-CM | POA: Diagnosis not present

## 2020-08-20 DIAGNOSIS — D123 Benign neoplasm of transverse colon: Secondary | ICD-10-CM

## 2020-08-20 DIAGNOSIS — M19042 Primary osteoarthritis, left hand: Secondary | ICD-10-CM | POA: Diagnosis not present

## 2020-08-20 DIAGNOSIS — K2970 Gastritis, unspecified, without bleeding: Secondary | ICD-10-CM

## 2020-08-20 DIAGNOSIS — S62302D Unspecified fracture of third metacarpal bone, right hand, subsequent encounter for fracture with routine healing: Secondary | ICD-10-CM | POA: Diagnosis not present

## 2020-08-20 DIAGNOSIS — K219 Gastro-esophageal reflux disease without esophagitis: Secondary | ICD-10-CM

## 2020-08-20 DIAGNOSIS — R6881 Early satiety: Secondary | ICD-10-CM

## 2020-08-20 DIAGNOSIS — Z1211 Encounter for screening for malignant neoplasm of colon: Secondary | ICD-10-CM | POA: Diagnosis not present

## 2020-08-20 DIAGNOSIS — K319 Disease of stomach and duodenum, unspecified: Secondary | ICD-10-CM | POA: Diagnosis not present

## 2020-08-20 DIAGNOSIS — B9681 Helicobacter pylori [H. pylori] as the cause of diseases classified elsewhere: Secondary | ICD-10-CM

## 2020-08-20 DIAGNOSIS — Z86010 Personal history of colonic polyps: Secondary | ICD-10-CM | POA: Diagnosis not present

## 2020-08-20 MED ORDER — DICYCLOMINE HCL 10 MG PO CAPS: 10.0000 mg | ORAL_CAPSULE | Freq: Three times a day (TID) | ORAL | 12 refills | Status: DC

## 2020-08-20 MED ORDER — SODIUM CHLORIDE 0.9 % IV SOLN: 500.0000 mL | Freq: Once | INTRAVENOUS | Status: DC

## 2020-08-20 NOTE — Progress Notes (Signed)
Called to room to assist during endoscopic procedure.  Patient ID and intended procedure confirmed with present staff. Received instructions for my participation in the procedure from the performing physician.  

## 2020-08-20 NOTE — Patient Instructions (Signed)
Handout on polyps, diverticulosis given.   YOU HAD AN ENDOSCOPIC PROCEDURE TODAY AT THE [facility]:   Refer to the procedure report that was given to you for any specific questions about what was found during the examination.  If the procedure report does not answer your questions, please call your gastroenterologist to clarify.  If you requested that your care partner not be given the details of your procedure findings, then the procedure report has been included in a sealed envelope for you to review at your convenience later.  YOU SHOULD EXPECT: Some feelings of bloating in the abdomen. Passage of more gas than usual.  Walking can help get rid of the air that was put into your GI tract during the procedure and reduce the bloating. If you had a lower endoscopy (such as a colonoscopy or flexible sigmoidoscopy) you may notice spotting of blood in your stool or on the toilet paper. If you underwent a bowel prep for your procedure, you may not have a normal bowel movement for a few days.  Please Note:  You might notice some irritation and congestion in your nose or some drainage.  This is from the oxygen used during your procedure.  There is no need for concern and it should clear up in a day or so.  SYMPTOMS TO REPORT IMMEDIATELY:  Following lower endoscopy (colonoscopy or flexible sigmoidoscopy):  Excessive amounts of blood in the stool  Significant tenderness or worsening of abdominal pains  Swelling of the abdomen that is new, acute  Fever of 100F or higher  Following upper endoscopy (EGD)  Vomiting of blood or coffee ground material  New chest pain or pain under the shoulder blades  Painful or persistently difficult swallowing  New shortness of breath  Fever of 100F or higher  Black, tarry-looking stools  For urgent or emergent issues, a gastroenterologist can be reached at any hour by calling [phone]. Do not use MyChart messaging for urgent concerns.    DIET:   We do recommend a small meal at first, but then you may proceed to your regular diet.  Drink plenty of fluids but you should avoid alcoholic beverages for 24 hours.  ACTIVITY:  You should plan to take it easy for the rest of today and you should NOT DRIVE or use heavy machinery until tomorrow (because of the sedation medicines used during the test).    FOLLOW UP: Our staff will call the number listed on your records 48-72 hours following your procedure to check on you and address any questions or concerns that you may have regarding the information given to you following your procedure. If we do not reach you, we will leave a message.  We will attempt to reach you two times.  During this call, we will ask if you have developed any symptoms of COVID 19. If you develop any symptoms (ie: fever, flu-like symptoms, shortness of breath, cough etc.) before then, please call [phone].  If you test positive for Covid 19 in the 2 weeks post procedure, please call and report this information to us.    If any biopsies were taken you will be contacted by phone or by letter within the next 1-3 weeks.  Please call us at [phone] if you have not heard about the biopsies in 3 weeks.    SIGNATURES/CONFIDENTIALITY: You and/or your care partner have signed paperwork which will be entered into your electronic medical record.  These signatures attest to the fact that   attest to the fact that that the information above on your After Visit Summary has been reviewed and is understood.  Full responsibility of the confidentiality of this discharge information lies with you and/or your care-partner.

## 2020-08-20 NOTE — Op Note (Signed)
[facility] Patient Name: Drue Camera Procedure Date: 08/20/2020 2:59 PM MRN: [id number] Endoscopist: Ladene Artist , MD Age: 55 Referring MD:  Date of Birth: 1965-08-23 Gender: Female Account #: [id number] Procedure:                Colonoscopy Indications:              Surveillance: Personal history of adenomatous                            polyps on last colonoscopy 3 years ago Medicines:                Monitored Anesthesia Care Procedure:                Pre-Anesthesia Assessment:                           - Prior to the procedure, a History and Physical                            was performed, and patient medications and                            allergies were reviewed. The patient's tolerance of                            previous anesthesia was also reviewed. The risks                            and benefits of the procedure and the sedation                            options and risks were discussed with the patient.                            All questions were answered, and informed consent                            was obtained. Prior Anticoagulants: The patient has                            taken no previous anticoagulant or antiplatelet                            agents. ASA Grade Assessment: II - A patient with                            mild systemic disease. After reviewing the risks                            and benefits, the patient was deemed in                            satisfactory condition to undergo the procedure.  After obtaining informed consent, the colonoscope                            was passed under direct vision. Throughout the                            procedure, the patient's blood pressure, pulse, and                            oxygen saturations were monitored continuously. The                            Olympus PFC-[id number] (#[id number]) Colonoscope was                            introduced through the anus  and advanced to the the                            cecum, identified by appendiceal orifice and                            ileocecal valve. The ileocecal valve, appendiceal                            orifice, and rectum were photographed. The quality                            of the bowel preparation was good. The colonoscopy                            was performed without difficulty. The patient                            tolerated the procedure well. Scope In: 3:10:06 PM Scope Out: 3:30:00 PM Scope Withdrawal Time: 0 hours 17 minutes 9 seconds  Total Procedure Duration: 0 hours 19 minutes 54 seconds  Findings:                 The perianal and digital rectal examinations were                            normal.                           Two sessile polyps were found in the transverse                            colon and ascending colon. The polyps were 4 to 5                            mm in size. These polyps were removed with a cold                            biopsy forceps. Resection and retrieval were  complete.                           Multiple small-mouthed diverticula were found in                            the left colon.                           The exam was otherwise without abnormality on                            direct and retroflexion views. Complications:            No immediate complications. Estimated blood loss:                            None. Estimated Blood Loss:     Estimated blood loss: none. Impression:               - Two 4 to 5 mm polyps in the transverse colon and                            in the ascending colon, removed with a cold biopsy                            forceps. Resected and retrieved.                           - Mild diverticulosis in the left colon.                           - The examination was otherwise normal on direct                            and retroflexion views. Recommendation:           - Repeat  colonoscopy after studies are complete for                            surveillance based on pathology results.                           - Patient has a contact number available for                            emergencies. The signs and symptoms of potential                            delayed complications were discussed with the                            patient. Return to normal activities tomorrow.                            Written discharge instructions were provided to the  patient.                           - Resume previous diet.                           - Continue present medications.                           - Await pathology results. Ladene Artist, MD 08/20/2020 3:40:26 PM This report has been signed electronically.

## 2020-08-20 NOTE — Op Note (Addendum)
[facility] Patient Name: Karen Case Procedure Date: 08/20/2020 2:59 PM MRN: [id number] Endoscopist: Ladene Artist , MD Age: 55 Referring MD:  Date of Birth: 08/20/1965 Gender: Female Account #: [id number] Procedure:                Upper GI endoscopy Indications:              Gastroesophageal reflux disease, Early satiety,                            Generalized abdominal pain, Medicines:                Monitored Anesthesia Care Procedure:                Pre-Anesthesia Assessment:                           - Prior to the procedure, a History and Physical                            was performed, and patient medications and                            allergies were reviewed. The patient's tolerance of                            previous anesthesia was also reviewed. The risks                            and benefits of the procedure and the sedation                            options and risks were discussed with the patient.                            All questions were answered, and informed consent                            was obtained. Prior Anticoagulants: The patient has                            taken no previous anticoagulant or antiplatelet                            agents. ASA Grade Assessment: II - A patient with                            mild systemic disease. After reviewing the risks                            and benefits, the patient was deemed in                            satisfactory condition to undergo the procedure.  After obtaining informed consent, the endoscope was                            passed under direct vision. Throughout the                            procedure, the patient's blood pressure, pulse, and                            oxygen saturations were monitored continuously. The                            Endoscope was introduced through the mouth, and                            advanced to the second part of  duodenum. The upper                            GI endoscopy was accomplished without difficulty.                            The patient tolerated the procedure well. Scope In: Scope Out: Findings:                 The examined esophagus was normal.                           A small hiatal hernia was present.                           Patchy mildly erythematous mucosa without bleeding                            was found in the gastric fundus and in the gastric                            body. Biopsies were taken with a cold forceps for                            histology.                           The exam of the stomach was otherwise normal.                           The duodenal bulb and second portion of the                            duodenum were normal. Complications:            No immediate complications. Estimated Blood Loss:     Estimated blood loss was minimal. Impression:               - Normal esophagus.                           -  Small hiatal hernia.                           - Erythematous mucosa in the gastric fundus and                            gastric body. Biopsied.                           - Normal duodenal bulb and second portion of the                            duodenum. Recommendation:           - Patient has a contact number available for                            emergencies. The signs and symptoms of potential                            delayed complications were discussed with the                            patient. Return to normal activities tomorrow.                            Written discharge instructions were provided to the                            patient.                           - Resume previous diet.                           - Continue present medications.                           - Dicylcomine 10 mg po tid, ac, 1 year of refills.                           - Await pathology results.                           - Return to GI office in 6  weeks with me or Carl Best, NP. Ladene Artist, MD 08/20/2020 3:44:26 PM This report has been signed electronically.

## 2020-08-20 NOTE — Progress Notes (Signed)
VS taken by C.W. 

## 2020-08-22 ENCOUNTER — Telehealth: Payer: Self-pay

## 2020-08-22 DIAGNOSIS — M25642 Stiffness of left hand, not elsewhere classified: Secondary | ICD-10-CM | POA: Diagnosis not present

## 2020-08-22 DIAGNOSIS — M545 Low back pain, unspecified: Secondary | ICD-10-CM | POA: Diagnosis not present

## 2020-08-22 DIAGNOSIS — S62301D Unspecified fracture of second metacarpal bone, left hand, subsequent encounter for fracture with routine healing: Secondary | ICD-10-CM | POA: Diagnosis not present

## 2020-08-22 NOTE — Telephone Encounter (Signed)
Called #[phone] and left a message we tried to reach pt for a follow up call. maw

## 2020-08-26 DIAGNOSIS — S62301D Unspecified fracture of second metacarpal bone, left hand, subsequent encounter for fracture with routine healing: Secondary | ICD-10-CM | POA: Diagnosis not present

## 2020-08-26 DIAGNOSIS — M25642 Stiffness of left hand, not elsewhere classified: Secondary | ICD-10-CM | POA: Diagnosis not present

## 2020-08-29 DIAGNOSIS — M533 Sacrococcygeal disorders, not elsewhere classified: Secondary | ICD-10-CM | POA: Diagnosis not present

## 2020-09-02 DIAGNOSIS — M25642 Stiffness of left hand, not elsewhere classified: Secondary | ICD-10-CM | POA: Diagnosis not present

## 2020-09-02 DIAGNOSIS — S62301D Unspecified fracture of second metacarpal bone, left hand, subsequent encounter for fracture with routine healing: Secondary | ICD-10-CM | POA: Diagnosis not present

## 2020-09-03 ENCOUNTER — Telehealth: Payer: Self-pay | Admitting: Gastroenterology

## 2020-09-03 NOTE — Telephone Encounter (Signed)
Inbound call from patient stating Linzess is not covered by insurance and is requesting alternate medication be sent to [facility] please.

## 2020-09-04 NOTE — Telephone Encounter (Signed)
I cannot tell with Epic what medications are covered by her insurance. Dr. Fuller Plan, what alternative medication do you want sent into patient's pharmacy?

## 2020-09-04 NOTE — Telephone Encounter (Signed)
Trulance 3 mg po qd, 1 yr of refills

## 2020-09-04 NOTE — Telephone Encounter (Signed)
Patient is spanish speaking. I asked Olene Craven, [facility] to contact patient and explain that we can send in another medication to her pharmacy but we do not know what is covered by her insurance. The best thing to do is contact her insurance to find out what medications are covered by her insurance. Patient states she will contact her insurance and call our office back.

## 2020-09-05 ENCOUNTER — Other Ambulatory Visit: Payer: Self-pay

## 2020-09-05 ENCOUNTER — Telehealth: Payer: Self-pay

## 2020-09-05 DIAGNOSIS — A048 Other specified bacterial intestinal infections: Secondary | ICD-10-CM

## 2020-09-05 DIAGNOSIS — S62301D Unspecified fracture of second metacarpal bone, left hand, subsequent encounter for fracture with routine healing: Secondary | ICD-10-CM | POA: Diagnosis not present

## 2020-09-05 DIAGNOSIS — M25642 Stiffness of left hand, not elsewhere classified: Secondary | ICD-10-CM | POA: Diagnosis not present

## 2020-09-05 MED ORDER — TALICIA 250-12.5-10 MG PO CPDR: DELAYED_RELEASE_CAPSULE | ORAL | 0 refills | Status: DC

## 2020-09-05 MED ORDER — LINACLOTIDE 145 MCG PO CAPS: ORAL_CAPSULE | ORAL | 1 refills | Status: DC

## 2020-09-05 NOTE — Progress Notes (Signed)
Cathy, Can you please contact the patient and relay the following to her.   She has a bacteria in her stomach called h pylori.  Can be associated with ulcers and she requires treatment with antibiotics. I have put a sample at the front desk on the 3rd floor to pick up called Talicia.  She will take 4 capsules three times a day (instructions on the box).  She will need to hold her pantoprazole while taking Talicia.   4 weeks after completing the Talicia she will need to come for a stool study in the lab to ensure eradication.  She may resume her Pantoprazole once she completes the [place], but will need to stop it for 2 weeks prior to the stool study.  She may take over the counter Pepcid if she has heartburn or indigestion.  Once she come for the stool study she may resume her pantoprazole.  She will be due for the stool study the week of 10/20/20.  Very important to be off pantoprazole for a full two weeks before coming for labs. Lab is open that week M-Thursday (will be closed Friday of that week for Good Friday).   1. Stop pantoprazole 2. Take 4 capsules of Talicia 3 times a day for 14 days. 3. Resume Pantoprazole for 2 weeks around ( 10/06/20)  4. Stop Pantoprazole and come for stool study the week of 10/20/20. 5. After delivering stool sample to the lab, resume Pantoprazole.

## 2020-09-05 NOTE — Telephone Encounter (Signed)
Left message to call back  

## 2020-09-05 NOTE — Telephone Encounter (Signed)
Sent Linzess 160mcg daily #90 per patient's request to her pharmacy.

## 2020-09-05 NOTE — Telephone Encounter (Signed)
Pt states her insurance will cover her Linzess if the prescription is for a 3 month supply.

## 2020-09-05 NOTE — Telephone Encounter (Signed)
Spoke with pt, advised her of all the instructions and she voiced understanding.

## 2020-09-05 NOTE — Addendum Note (Signed)
Addended by: Dorisann Frames L on: 09/05/2020 12:49 PM   Modules accepted: Orders

## 2020-09-05 NOTE — Telephone Encounter (Signed)
Cathy, Can you please contact the patient and relay the following to her. She speaks Spanish  She has a bacteria in her stomach called h pylori.  Can be associated with ulcers and she requires treatment with antibiotics. I have put a sample at the front desk on the 3rd floor to pick up called Talicia.  She will take 4 capsules three times a day (instructions on the box).  She will need to hold her pantoprazole while taking Talicia.   4 weeks after completing the Talicia she will need to come for a stool study in the lab to ensure eradication.  She may resume her Pantoprazole once she completes the [place], but will need to stop it for 2 weeks prior to the stool study.  She may take over the counter Pepcid if she has heartburn or indigestion.  Once she come for the stool study she may resume her pantoprazole.  She will be due for the stool study the week of 10/20/20.  Very important to be off pantoprazole for a full two weeks before coming for labs. Lab is open that week M-Thursday (will be closed Friday of that week for Good Friday).   1. Stop pantoprazole 2. Take 4 capsules of Talicia 3 times a day for 14 days. 3. Resume Pantoprazole for 2 weeks around ( 10/06/20)  4. Stop Pantoprazole and come for stool study the week of 10/20/20. 5. After delivering stool sample to the lab, resume Pantoprazole.

## 2020-09-08 DIAGNOSIS — S335XXD Sprain of ligaments of lumbar spine, subsequent encounter: Secondary | ICD-10-CM | POA: Diagnosis not present

## 2020-09-08 DIAGNOSIS — S336XXD Sprain of sacroiliac joint, subsequent encounter: Secondary | ICD-10-CM | POA: Diagnosis not present

## 2020-09-09 DIAGNOSIS — S62301D Unspecified fracture of second metacarpal bone, left hand, subsequent encounter for fracture with routine healing: Secondary | ICD-10-CM | POA: Diagnosis not present

## 2020-09-09 DIAGNOSIS — M25642 Stiffness of left hand, not elsewhere classified: Secondary | ICD-10-CM | POA: Diagnosis not present

## 2020-09-12 DIAGNOSIS — S62301D Unspecified fracture of second metacarpal bone, left hand, subsequent encounter for fracture with routine healing: Secondary | ICD-10-CM | POA: Diagnosis not present

## 2020-09-12 DIAGNOSIS — M25642 Stiffness of left hand, not elsewhere classified: Secondary | ICD-10-CM | POA: Diagnosis not present

## 2020-09-16 DIAGNOSIS — S62301D Unspecified fracture of second metacarpal bone, left hand, subsequent encounter for fracture with routine healing: Secondary | ICD-10-CM | POA: Diagnosis not present

## 2020-09-16 DIAGNOSIS — M25642 Stiffness of left hand, not elsewhere classified: Secondary | ICD-10-CM | POA: Diagnosis not present

## 2020-09-17 DIAGNOSIS — M533 Sacrococcygeal disorders, not elsewhere classified: Secondary | ICD-10-CM | POA: Diagnosis not present

## 2020-09-24 DIAGNOSIS — M25642 Stiffness of left hand, not elsewhere classified: Secondary | ICD-10-CM | POA: Diagnosis not present

## 2020-09-24 DIAGNOSIS — S62301D Unspecified fracture of second metacarpal bone, left hand, subsequent encounter for fracture with routine healing: Secondary | ICD-10-CM | POA: Diagnosis not present

## 2020-09-25 DIAGNOSIS — S336XXD Sprain of sacroiliac joint, subsequent encounter: Secondary | ICD-10-CM | POA: Diagnosis not present

## 2020-09-25 DIAGNOSIS — S335XXD Sprain of ligaments of lumbar spine, subsequent encounter: Secondary | ICD-10-CM | POA: Diagnosis not present

## 2020-09-26 DIAGNOSIS — S62302D Unspecified fracture of third metacarpal bone, right hand, subsequent encounter for fracture with routine healing: Secondary | ICD-10-CM | POA: Diagnosis not present

## 2020-09-30 DIAGNOSIS — S335XXD Sprain of ligaments of lumbar spine, subsequent encounter: Secondary | ICD-10-CM | POA: Diagnosis not present

## 2020-09-30 DIAGNOSIS — S336XXD Sprain of sacroiliac joint, subsequent encounter: Secondary | ICD-10-CM | POA: Diagnosis not present

## 2020-10-02 DIAGNOSIS — S335XXD Sprain of ligaments of lumbar spine, subsequent encounter: Secondary | ICD-10-CM | POA: Diagnosis not present

## 2020-10-02 DIAGNOSIS — S336XXD Sprain of sacroiliac joint, subsequent encounter: Secondary | ICD-10-CM | POA: Diagnosis not present

## 2020-10-07 DIAGNOSIS — S335XXD Sprain of ligaments of lumbar spine, subsequent encounter: Secondary | ICD-10-CM | POA: Diagnosis not present

## 2020-10-07 DIAGNOSIS — S336XXD Sprain of sacroiliac joint, subsequent encounter: Secondary | ICD-10-CM | POA: Diagnosis not present

## 2020-10-09 ENCOUNTER — Encounter: Payer: BC Managed Care – PPO | Admitting: Internal Medicine

## 2020-10-10 DIAGNOSIS — S336XXD Sprain of sacroiliac joint, subsequent encounter: Secondary | ICD-10-CM | POA: Diagnosis not present

## 2020-10-10 DIAGNOSIS — S335XXD Sprain of ligaments of lumbar spine, subsequent encounter: Secondary | ICD-10-CM | POA: Diagnosis not present

## 2020-10-17 DIAGNOSIS — Z01419 Encounter for gynecological examination (general) (routine) without abnormal findings: Secondary | ICD-10-CM | POA: Diagnosis not present

## 2020-10-17 DIAGNOSIS — R300 Dysuria: Secondary | ICD-10-CM | POA: Diagnosis not present

## 2020-10-31 ENCOUNTER — Other Ambulatory Visit: Payer: BC Managed Care – PPO

## 2020-10-31 DIAGNOSIS — A048 Other specified bacterial intestinal infections: Secondary | ICD-10-CM

## 2020-11-03 DIAGNOSIS — S335XXD Sprain of ligaments of lumbar spine, subsequent encounter: Secondary | ICD-10-CM | POA: Diagnosis not present

## 2020-11-03 DIAGNOSIS — S336XXD Sprain of sacroiliac joint, subsequent encounter: Secondary | ICD-10-CM | POA: Diagnosis not present

## 2020-11-12 ENCOUNTER — Encounter: Payer: BC Managed Care – PPO | Admitting: Internal Medicine

## 2020-11-14 ENCOUNTER — Other Ambulatory Visit: Payer: BC Managed Care – PPO

## 2020-11-14 ENCOUNTER — Telehealth: Payer: Self-pay | Admitting: Gastroenterology

## 2020-11-14 DIAGNOSIS — A048 Other specified bacterial intestinal infections: Secondary | ICD-10-CM

## 2020-11-14 MED ORDER — LINACLOTIDE 145 MCG PO CAPS: ORAL_CAPSULE | ORAL | 1 refills | Status: DC

## 2020-11-14 NOTE — Telephone Encounter (Signed)
Refill sent.

## 2020-11-17 LAB — HELICOBACTER PYLORI  SPECIAL ANTIGEN
MICRO NUMBER:: 11860003
SPECIMEN QUALITY: ADEQUATE

## 2020-11-24 ENCOUNTER — Encounter: Payer: Self-pay | Admitting: Gastroenterology

## 2020-12-29 ENCOUNTER — Ambulatory Visit (INDEPENDENT_AMBULATORY_CARE_PROVIDER_SITE_OTHER): Payer: BC Managed Care – PPO | Admitting: Internal Medicine

## 2020-12-29 ENCOUNTER — Other Ambulatory Visit: Payer: Self-pay

## 2020-12-29 ENCOUNTER — Encounter: Payer: Self-pay | Admitting: Internal Medicine

## 2020-12-29 VITALS — BP 116/70 | HR 74 | Temp 98.3°F | Resp 16 | Ht 63.0 in | Wt 135.2 lb

## 2020-12-29 DIAGNOSIS — Z23 Encounter for immunization: Secondary | ICD-10-CM | POA: Diagnosis not present

## 2020-12-29 DIAGNOSIS — Z0000 Encounter for general adult medical examination without abnormal findings: Secondary | ICD-10-CM | POA: Diagnosis not present

## 2020-12-29 DIAGNOSIS — Z1159 Encounter for screening for other viral diseases: Secondary | ICD-10-CM

## 2020-12-29 LAB — COMPREHENSIVE METABOLIC PANEL
ALT: 13 U/L (ref 0–35)
AST: 13 U/L (ref 0–37)
Albumin: 4.3 g/dL (ref 3.5–5.2)
Alkaline Phosphatase: 89 U/L (ref 39–117)
BUN: 17 mg/dL (ref 6–23)
CO2: 32 mEq/L (ref 19–32)
Calcium: 9.3 mg/dL (ref 8.4–10.5)
Chloride: 103 mEq/L (ref 96–112)
Creatinine, Ser: 0.59 mg/dL (ref 0.40–1.20)
GFR: 102.36 mL/min (ref >60.00)
Glucose, Bld: 88 mg/dL (ref 70–99)
Potassium: 4.1 mEq/L (ref 3.5–5.1)
Sodium: 140 mEq/L (ref 135–145)
Total Bilirubin: 0.7 mg/dL (ref 0.2–1.2)
Total Protein: 6.5 g/dL (ref 6.0–8.3)

## 2020-12-29 LAB — CBC WITH DIFFERENTIAL/PLATELET
Basophils Absolute: 0 10*3/uL (ref 0.0–0.1)
Basophils Relative: 0.7 % (ref 0.0–3.0)
Eosinophils Absolute: 0.2 10*3/uL (ref 0.0–0.7)
Eosinophils Relative: 5.1 % — ABNORMAL HIGH (ref 0.0–5.0)
HCT: 39.1 % (ref 36.0–46.0)
Hemoglobin: 13.2 g/dL (ref 12.0–15.0)
Lymphocytes Relative: 33.1 % (ref 12.0–46.0)
Lymphs Abs: 1.2 10*3/uL (ref 0.7–4.0)
MCHC: 33.8 g/dL (ref 30.0–36.0)
MCV: 97.1 fl (ref 78.0–100.0)
Monocytes Absolute: 0.3 10*3/uL (ref 0.1–1.0)
Monocytes Relative: 7.8 % (ref 3.0–12.0)
Neutro Abs: 2 10*3/uL (ref 1.4–7.7)
Neutrophils Relative %: 53.3 % (ref 43.0–77.0)
Platelets: 363 10*3/uL (ref 150.0–400.0)
RBC: 4.03 Mil/uL (ref 3.87–5.11)
RDW: 12.7 % (ref 11.5–15.5)
WBC: 3.8 10*3/uL — ABNORMAL LOW (ref 4.0–10.5)

## 2020-12-29 LAB — LIPID PANEL
Cholesterol: 183 mg/dL (ref 0–200)
HDL: 59 mg/dL (ref >39.00)
LDL Cholesterol: 102 mg/dL — ABNORMAL HIGH (ref 0–99)
NonHDL: 124.19
Total CHOL/HDL Ratio: 3
Triglycerides: 113 mg/dL (ref 0.0–149.0)
VLDL: 22.6 mg/dL (ref 0.0–40.0)

## 2020-12-29 LAB — VITAMIN D 25 HYDROXY (VIT D DEFICIENCY, FRACTURES): VITD: 30.17 ng/mL (ref 30.00–100.00)

## 2020-12-29 NOTE — Progress Notes (Signed)
Subjective:    Patient ID: Karen Case, female    DOB: Sep 27, 1965, 55 y.o.   MRN: [id number]  DOS:  12/29/2020 Type of visit - description:  CPX  Since the last office visit he is doing well. Has noted a small lump on the left foot few days ago, it hurts sometimes.  Review of Systems  Other than above, a 14 point review of systems is negative       Past Medical History:  Diagnosis Date   Birth control    husband's vasectomy   Chronic constipation 06/30/2011   GERD (gastroesophageal reflux disease)     Past Surgical History:  Procedure Laterality Date   BIOPSY TEAR [facility] Right 2012   Right lacrimal gland biopsy, chronic inflammation, no malignancy   BUNIONECTOMY  2000   Right   LIPOMA EXCISION Right 11/22/2019   R arm   Family History  Problem Relation Age of Onset   Hypertension Mother        M   Heart attack Father    Hyperlipidemia Neg Hx    Coronary artery disease Neg Hx    Stroke Neg Hx    Colon cancer Neg Hx    Breast cancer Neg Hx    Esophageal cancer Neg Hx    Liver cancer Neg Hx    Pancreatic cancer Neg Hx    Prostate cancer Neg Hx    Rectal cancer Neg Hx    Stomach cancer Neg Hx     Allergies as of 12/29/2020   No Known Allergies      Medication List        Accurate as of December 29, 2020 11:59 PM. If you have any questions, ask your nurse or doctor.          STOP taking these medications    Talicia 616-07.3-71 MG Cpdr Generic drug: Amoxicill-Rifabutin-Omeprazole Stopped by: Kathlene November, MD       TAKE these medications    cholecalciferol 25 MCG (1000 UNIT) tablet Commonly known as: VITAMIN D3 Take 1,000 Units by mouth daily.   dicyclomine 10 MG capsule Commonly known as: BENTYL Take 1 capsule (10 mg total) by mouth 3 (three) times daily before meals.   linaclotide 145 MCG Caps capsule Commonly known as: Linzess TAKE 1 CAPSULE BY MOUTH DAILY BEFORE BREAKFAST.   pantoprazole 40 MG tablet Commonly known as: PROTONIX Take 1  tablet (40 mg total) by mouth daily.           Objective:   Physical Exam Musculoskeletal:       Feet:   BP 116/70 (BP Location: Left Arm, Patient Position: Sitting, Cuff Size: Small)   Pulse 74   Temp 98.3 F (36.8 C) (Oral)   Resp 16   Ht 5\' 3"  (1.6 m)   Wt 135 lb 4 oz (61.3 kg)   LMP 07/15/2012   SpO2 98%   BMI 23.96 kg/m  General: Well developed, NAD, BMI noted Neck: No  thyromegaly  HEENT:  Normocephalic . Face symmetric, atraumatic Lungs:  CTA B Normal respiratory effort, no intercostal retractions, no accessory muscle use. Heart: RRR,  no murmur.  Abdomen:  Not distended, soft, non-tender. No rebound or rigidity.   Lower extremities: no pretibial edema bilaterally  Skin: Exposed areas without rash. Not pale. Not jaundice Neurologic:  alert & oriented X3.  Speech normal, gait appropriate for age and unassisted Strength symmetric and appropriate for age.  Psych: Cognition and judgment appear intact.  Cooperative with  normal attention span and concentration.  Behavior appropriate. No anxious or depressed appearing.     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 RTC 1 year    This visit occurred during the SARS-CoV-2 public health emergency.  Safety protocols were in place, including screening questions prior to the visit, additional usage of staff PPE, and extensive cleaning of exam room while observing appropriate contact time as indicated for disinfecting solutions.

## 2020-12-29 NOTE — Patient Instructions (Signed)
GO TO THE LAB : Get the blood work     GO TO THE FRONT DESK, [place] back for the second Shingrix shot in 2 to 3 months, please make an appointment Come back for a physical exam in 1 year  Proceed with your COVID-vaccine #4 at your convenience

## 2020-12-30 ENCOUNTER — Encounter: Payer: Self-pay | Admitting: Internal Medicine

## 2020-12-30 LAB — HEPATITIS C ANTIBODY
Hepatitis C Ab: NONREACTIVE
SIGNAL TO CUT-OFF: 0.01 (ref <1.00)

## 2020-12-30 NOTE — Assessment & Plan Note (Signed)
Here for CPX Lump, left foot, suspect a tendon cyst, recommend to see Ortho if becomes symptomatic. RTC 1 year

## 2020-12-30 NOTE — Assessment & Plan Note (Signed)
-  Td 2012, booster today - Shingrix # 1 today. - s/p covid vaccines x3 , ok to proceed w/ 4th -CCS:   cscope, 09/2017,Cscope 08/2020, next per GI -Female care per gyn , [facility] 06/2020 -Very active at work but no routine exercise. - diet: healthy -She does take vit D qd -Labs: CMP, FLP, CBC, vitamin D and hep C.

## 2021-01-23 ENCOUNTER — Telehealth: Payer: Self-pay | Admitting: Gastroenterology

## 2021-01-23 MED ORDER — LINACLOTIDE 145 MCG PO CAPS: ORAL_CAPSULE | ORAL | 1 refills | Status: DC

## 2021-01-23 NOTE — Telephone Encounter (Signed)
Prescription sent to pharmacy.

## 2021-01-23 NOTE — Telephone Encounter (Signed)
Patients spouse called requesting a 3 pack refill for Linzess

## 2021-01-27 DIAGNOSIS — H1045 Other chronic allergic conjunctivitis: Secondary | ICD-10-CM | POA: Diagnosis not present

## 2021-03-06 ENCOUNTER — Other Ambulatory Visit: Payer: Self-pay

## 2021-03-06 ENCOUNTER — Ambulatory Visit (INDEPENDENT_AMBULATORY_CARE_PROVIDER_SITE_OTHER): Payer: BC Managed Care – PPO

## 2021-03-06 DIAGNOSIS — Z23 Encounter for immunization: Secondary | ICD-10-CM | POA: Diagnosis not present

## 2021-03-06 NOTE — Progress Notes (Signed)
Patient here today for second shingrix. 0.61m given in left deltoid IM. Patient tolerated well. [facility] updated.

## 2021-03-10 DIAGNOSIS — M533 Sacrococcygeal disorders, not elsewhere classified: Secondary | ICD-10-CM | POA: Diagnosis not present

## 2021-03-10 DIAGNOSIS — M722 Plantar fascial fibromatosis: Secondary | ICD-10-CM | POA: Diagnosis not present

## 2021-03-31 DIAGNOSIS — M533 Sacrococcygeal disorders, not elsewhere classified: Secondary | ICD-10-CM | POA: Diagnosis not present

## 2021-04-08 ENCOUNTER — Telehealth: Payer: Self-pay | Admitting: Gastroenterology

## 2021-04-08 MED ORDER — LINACLOTIDE 145 MCG PO CAPS: ORAL_CAPSULE | ORAL | 1 refills | Status: DC

## 2021-04-08 NOTE — Telephone Encounter (Signed)
Prescription sent to patient's pharmacy.

## 2021-04-08 NOTE — Telephone Encounter (Signed)
Inbound call from patient husband. Need medication refill for Linzess 3 month supply. [facility]

## 2021-05-17 DIAGNOSIS — M545 Low back pain, unspecified: Secondary | ICD-10-CM | POA: Diagnosis not present

## 2021-05-17 DIAGNOSIS — M62830 Muscle spasm of back: Secondary | ICD-10-CM | POA: Diagnosis not present

## 2021-06-01 ENCOUNTER — Encounter: Payer: Self-pay | Admitting: Gastroenterology

## 2021-06-01 ENCOUNTER — Ambulatory Visit (INDEPENDENT_AMBULATORY_CARE_PROVIDER_SITE_OTHER): Payer: BC Managed Care – PPO | Admitting: Gastroenterology

## 2021-06-01 VITALS — BP 100/60 | HR 76 | Ht 62.5 in | Wt 140.1 lb

## 2021-06-01 DIAGNOSIS — K581 Irritable bowel syndrome with constipation: Secondary | ICD-10-CM

## 2021-06-01 DIAGNOSIS — R1084 Generalized abdominal pain: Secondary | ICD-10-CM

## 2021-06-01 NOTE — Progress Notes (Addendum)
    History of Present Illness: This is a 55 year old female with generalized abdominal pain, bloating, early satiety, nausea.  Pain generally more pronounced in her lower abdomen. She has IBS-C maintained on Linzess and GERD. She is not taking dicyclomine and pantoprazole as prescribed.  Colonoscopy and EGD were performed in February 2022 showing 2 small colon polyps (1 TA, 1 benign mucosa), mild diverticulosis, a small hiatal hernia and H. pylori gastritis which was treated.  Current Medications, Allergies, Past Medical History, Past Surgical History, Family History and Social History were reviewed in [employer] record.   Physical Exam: General: Well developed, well nourished, no acute distress Head: Normocephalic and atraumatic Eyes: Sclerae anicteric, EOMI Ears: Normal auditory acuity Mouth: Not examined, mask on during Covid-19 pandemic Lungs: Clear throughout to auscultation Heart: Regular rate and rhythm; no murmurs, rubs or bruits Abdomen: Soft, mild lower abdominal tenderness and non distended. No masses, hepatosplenomegaly or hernias noted. Normal Bowel sounds Rectal: Not done Musculoskeletal: Symmetrical with no gross deformities  Pulses:  Normal pulses noted Extremities: No clubbing, cyanosis, edema or deformities noted Neurological: Alert oriented x 4, grossly nonfocal Psychological:  Alert and cooperative. Normal mood and affect   Assessment and Recommendations:  Generalized abdominal pain and. IBS-C. Continue Linzess 145 mcg po qd. Resume dicyclomine 10 mg po tid ac. If not effective increase to 20 mg. Schedule CT AP. REV in 6 weeks.  GERD, early satiety. Trial of resuming pantoprazole 40 mg po qam. Consider GES if not improved. REV in 6 weeks.  History of H pylori gastritis, treated. H pylori fecal testing negative post treatment.  Personal history of adenomatous colon polyps. Surveillance colonoscopy in Feb. 2029.

## 2021-06-01 NOTE — Patient Instructions (Addendum)
Restart pantoprazole daily and restart dicyclomine 30 minutes before meals.   You have been scheduled for a CT scan of the abdomen and pelvis at [facility] ([address]---this is in the same building as [employer]).   You are scheduled on 06/02/21 at 8:30am. You should arrive 15 minutes prior to your appointment time for registration. Please follow the written instructions below on the day of your exam:  WARNING: IF YOU ARE ALLERGIC TO IODINE/X-RAY DYE, PLEASE NOTIFY RADIOLOGY IMMEDIATELY AT [phone]! YOU WILL BE GIVEN A 13 HOUR PREMEDICATION PREP.  1) Do not eat or drink anything after 4:30am (4 hours prior to your test) 2) You have been given 2 bottles of oral contrast to drink. The solution may taste better if refrigerated, but do NOT add ice or any other liquid to this solution. Shake well before drinking.    Drink 1 bottle of contrast @ 6:30am (2 hours prior to your exam)  Drink 1 bottle of contrast @ 7:30am (1 hour prior to your exam)  You may take any medications as prescribed with a small amount of water, if necessary. If you take any of the following medications: METFORMIN, GLUCOPHAGE, GLUCOVANCE, AVANDAMET, RIOMET, FORTAMET, Vance MET, JANUMET, GLUMETZA or METAGLIP, you MAY be asked to HOLD this medication 48 hours AFTER the exam.  The purpose of you drinking the oral contrast is to aid in the visualization of your intestinal tract. The contrast solution may cause some diarrhea. Depending on your individual set of symptoms, you may also receive an intravenous injection of x-ray contrast/dye. Plan on being at [facility] for 30 minutes or longer, depending on the type of exam you are having performed.  This test typically takes 30-45 minutes to complete.  If you have any questions regarding your exam or if you need to reschedule, you may call the CT department at [phone] between the hours of 8:00 am and 5:00 pm, Monday-Friday.   Due to  recent changes in healthcare laws, you may see the results of your imaging and laboratory studies on MyChart before your provider has had a chance to review them.  We understand that in some cases there may be results that are confusing or concerning to you. Not all laboratory results come back in the same time frame and the provider may be waiting for multiple results in order to interpret others.  Please give [place] 48 hours in order for your provider to thoroughly review all the results before contacting the office for clarification of your results.    The [facility] providers would like to encourage you to use [facility] to communicate with providers for non-urgent requests or questions.  Due to long hold times on the telephone, sending your provider a message by [facility] may be a faster and more efficient way to get a response.  Please allow 48 business hours for a response.  Please remember that this is for non-urgent requests.   ________________________________________________________________________

## 2021-06-02 ENCOUNTER — Ambulatory Visit (INDEPENDENT_AMBULATORY_CARE_PROVIDER_SITE_OTHER)
Admission: RE | Admit: 2021-06-02 | Discharge: 2021-06-02 | Disposition: A | Discharge status: Home / Self Care | Payer: BC Managed Care – PPO | Source: Ambulatory Visit | Attending: Gastroenterology | Admitting: Gastroenterology

## 2021-06-02 ENCOUNTER — Other Ambulatory Visit: Payer: Self-pay

## 2021-06-02 DIAGNOSIS — R1084 Generalized abdominal pain: Secondary | ICD-10-CM | POA: Diagnosis not present

## 2021-06-02 DIAGNOSIS — R109 Unspecified abdominal pain: Secondary | ICD-10-CM | POA: Diagnosis not present

## 2021-06-02 DIAGNOSIS — K581 Irritable bowel syndrome with constipation: Secondary | ICD-10-CM | POA: Diagnosis not present

## 2021-06-02 MED ORDER — IOHEXOL 350 MG/ML SOLN
100.0000 mL | Freq: Once | INTRAVENOUS | Status: AC | PRN
  Administered 2021-06-02: 100 mL via INTRAVENOUS

## 2021-06-03 ENCOUNTER — Telehealth: Payer: Self-pay | Admitting: Gastroenterology

## 2021-06-03 NOTE — Telephone Encounter (Signed)
Patient called saying she got the results of the CT scan which was normal.  However, she is still experiencing abdominal pain and bloating and wanted to know if there was any medication she could take to help with that.  Please call patient and advise.  Thank you.

## 2021-06-03 NOTE — Telephone Encounter (Signed)
Patient advisd to try the dicyclomine and pantoprazole Rx that was discussed with her again at the office visit on 06/01/21.  She will call back if this fails to improve her symptoms

## 2021-06-08 ENCOUNTER — Other Ambulatory Visit: Payer: Self-pay | Admitting: Internal Medicine

## 2021-06-08 DIAGNOSIS — Z1231 Encounter for screening mammogram for malignant neoplasm of breast: Secondary | ICD-10-CM

## 2021-06-23 ENCOUNTER — Telehealth: Payer: Self-pay | Admitting: Gastroenterology

## 2021-06-23 MED ORDER — LINACLOTIDE 145 MCG PO CAPS: ORAL_CAPSULE | ORAL | 2 refills | Status: DC

## 2021-06-23 NOTE — Telephone Encounter (Signed)
Patients husband called and states that patient needed a refill on Linzess. Please advise.

## 2021-06-23 NOTE — Telephone Encounter (Signed)
Prescription for Linzess sent to patient's pharmacy.  

## 2021-07-14 ENCOUNTER — Other Ambulatory Visit: Payer: Self-pay

## 2021-07-14 ENCOUNTER — Ambulatory Visit
Admission: RE | Admit: 2021-07-14 | Discharge: 2021-07-14 | Disposition: A | Discharge status: Home / Self Care | Payer: BC Managed Care – PPO | Source: Ambulatory Visit | Attending: Internal Medicine | Admitting: Internal Medicine

## 2021-07-14 DIAGNOSIS — Z1231 Encounter for screening mammogram for malignant neoplasm of breast: Secondary | ICD-10-CM

## 2021-07-21 ENCOUNTER — Ambulatory Visit (INDEPENDENT_AMBULATORY_CARE_PROVIDER_SITE_OTHER): Payer: BC Managed Care – PPO | Admitting: Gastroenterology

## 2021-07-21 ENCOUNTER — Encounter: Payer: Self-pay | Admitting: Gastroenterology

## 2021-07-21 VITALS — BP 120/70 | HR 53 | Ht 62.0 in | Wt 139.6 lb

## 2021-07-21 DIAGNOSIS — K581 Irritable bowel syndrome with constipation: Secondary | ICD-10-CM

## 2021-07-21 DIAGNOSIS — K219 Gastro-esophageal reflux disease without esophagitis: Secondary | ICD-10-CM

## 2021-07-21 NOTE — Progress Notes (Signed)
° ° °  History of Present Illness: This is a 56 year old female returning for follow-up of IBS-C, GERD and intermittent left lower quadrant pain.  She resumes pantoprazole daily and her symptoms have improved. CT AP 06/02/2021 was negative.  She attempted to get dicyclomine however the pharmacy did not have it at the time.  Her constipation is well controlled on daily Linzess.  Her reflux symptoms are well controlled on daily pantoprazole.  She is unsure what brings of her intermittent left lower quadrant pain but it is less often and less severe.   Current Medications, Allergies, Past Medical History, Past Surgical History, Family History and Social History were reviewed in [employer] record.   Physical Exam: General: Well developed, well nourished, no acute distress Head: Normocephalic and atraumatic Eyes: Sclerae anicteric, EOMI Ears: Normal auditory acuity Mouth: Not examined, mask on during Covid-19 pandemic Lungs: Clear throughout to auscultation Heart: Regular rate and rhythm; no murmurs, rubs or bruits Abdomen: Soft, non tender and non distended. No masses, hepatosplenomegaly or hernias noted. Normal Bowel sounds Rectal: Not done Musculoskeletal: Symmetrical with no gross deformities  Pulses:  Normal pulses noted Extremities: No clubbing, cyanosis, edema or deformities noted Neurological: Alert oriented x 4, grossly nonfocal Psychological:  Alert and cooperative. Normal mood and affect   Assessment and Recommendations:  IBS-C. Continue Linzess 145 mcg po qd. dicyclomine 10 mg 3 times daily as needed abdominal pain or IBgard 1-2 PO 3 times daily as needed for abdominal pain. REV in 1 year. 2.  GERD.  Follow antireflux measures and continue pantoprazole 40 g po qam.  3.  History of H pylori gastritis, treated. H pylori fecal test negative post treatment.  4.  Personal history of adenomatous colon polyps. Surveillance colonoscopy in Feb. 2029.

## 2021-07-21 NOTE — Patient Instructions (Signed)
Please fill your dicyclomine prescription and take it as needed or you can take over the counter IBgard 2 capsules by mouth three times a day as needed.   The [facility] providers would like to encourage you to use [facility] to communicate with providers for non-urgent requests or questions.  Due to long hold times on the telephone, sending your provider a message by [facility] may be a faster and more efficient way to get a response.  Please allow 48 business hours for a response.  Please remember that this is for non-urgent requests.   Thank you for choosing me and [facility].  Pricilla Riffle. Dagoberto Ligas., MD., [facility]

## 2021-08-29 ENCOUNTER — Other Ambulatory Visit: Payer: Self-pay | Admitting: Gastroenterology

## 2021-08-31 DIAGNOSIS — M25561 Pain in right knee: Secondary | ICD-10-CM | POA: Diagnosis not present

## 2021-09-10 ENCOUNTER — Encounter: Payer: Self-pay | Admitting: Internal Medicine

## 2021-09-14 ENCOUNTER — Telehealth: Payer: Self-pay | Admitting: Gastroenterology

## 2021-09-14 MED ORDER — LINACLOTIDE 145 MCG PO CAPS: ORAL_CAPSULE | ORAL | 2 refills | Status: DC

## 2021-09-14 NOTE — Telephone Encounter (Signed)
Left message for patient to return my call.

## 2021-09-14 NOTE — Telephone Encounter (Signed)
Patients husband called and stated that it was going to cost them 500 dollars for Linzess. Stated that normally they get a 3 month supply at it only cost them around 20 dollars. Seeking advise if there is anyway they can get a 3 month supply so they do not have to pay that much for the medication. Please advise.  ?

## 2021-09-14 NOTE — Telephone Encounter (Signed)
Patient husband states that he went to get the prescription from the pharmacy and it was a 30 day supply which costs over $500. Informed patient we sent in the last prescription for 90 day supply in December so I am not sure why it was changed to a 30 day but I will resend the prescription for 90 days. Pres[facility]. ?

## 2021-09-14 NOTE — Telephone Encounter (Signed)
Patient's husband, Francee Piccolo, returned your call on behalf of patient.  He said if you would please call him as his wife cannot take phone calls at her work.  You may reach him at [phone].  Thank you. ?

## 2021-09-14 NOTE — Telephone Encounter (Signed)
Patients husband is returning your call. 

## 2021-09-15 DIAGNOSIS — M25561 Pain in right knee: Secondary | ICD-10-CM | POA: Diagnosis not present

## 2021-09-15 NOTE — Telephone Encounter (Signed)
Patients husband called is asking if the script was sent out along with any coupons or discounts also wants to know if there is any generic brand available. ?

## 2021-09-15 NOTE — Telephone Encounter (Signed)
Patient's husband states he was wondering if there is a coupon for Linzess. Informed patient's husband to apply online for a commercial coupon. Walked patient's husband through how to get coupon. He said he will call back if this coupon is not enough of a discount.  ?

## 2021-09-26 DIAGNOSIS — M545 Low back pain, unspecified: Secondary | ICD-10-CM | POA: Diagnosis not present

## 2021-10-11 DIAGNOSIS — L259 Unspecified contact dermatitis, unspecified cause: Secondary | ICD-10-CM | POA: Diagnosis not present

## 2021-10-23 DIAGNOSIS — Z01419 Encounter for gynecological examination (general) (routine) without abnormal findings: Secondary | ICD-10-CM | POA: Diagnosis not present

## 2021-11-25 IMAGING — DX DG LUMBAR SPINE COMPLETE 4+V
5 series · 5 of 5 positions shown · non-contrast
Comparison: None.

CLINICAL DATA: Lumbosacral back pain radiating down right leg to
ankle. Pain for 1 week.

EXAM:
LUMBAR SPINE - COMPLETE 4+ VIEW

[l-spine ap]
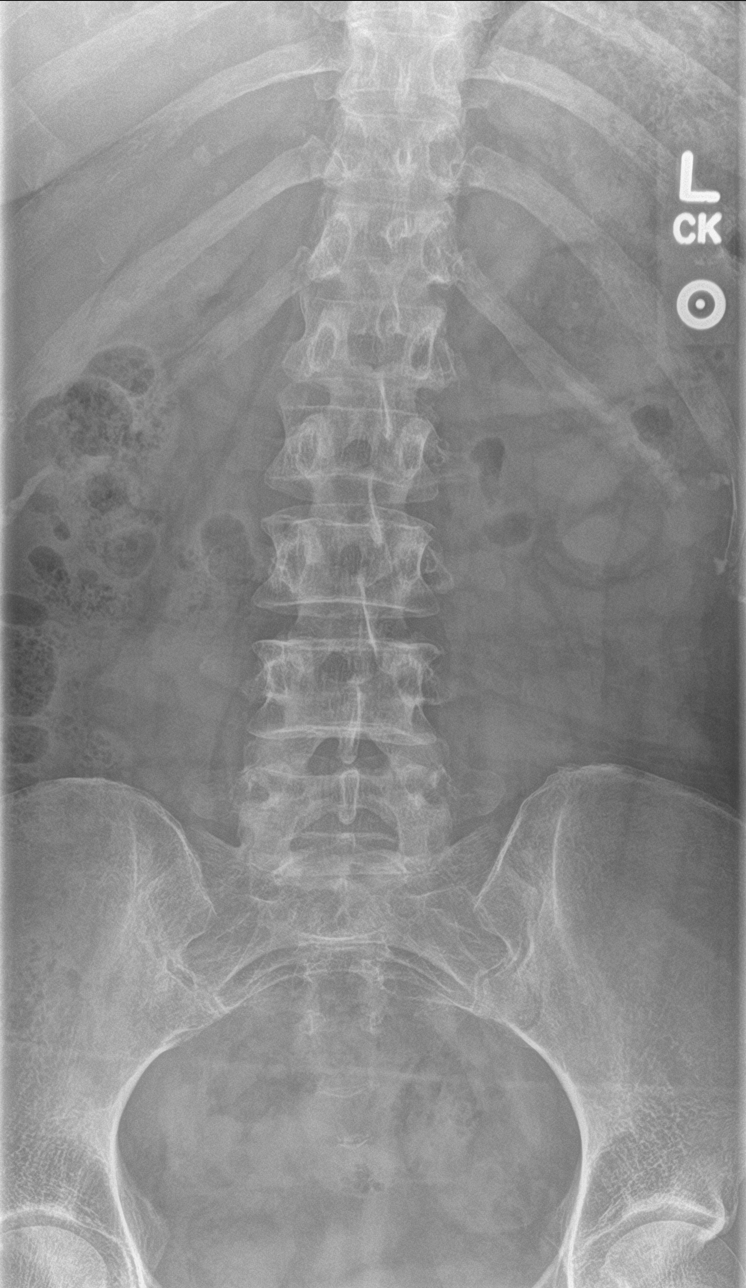

[l-spine obl (1 of 2)]
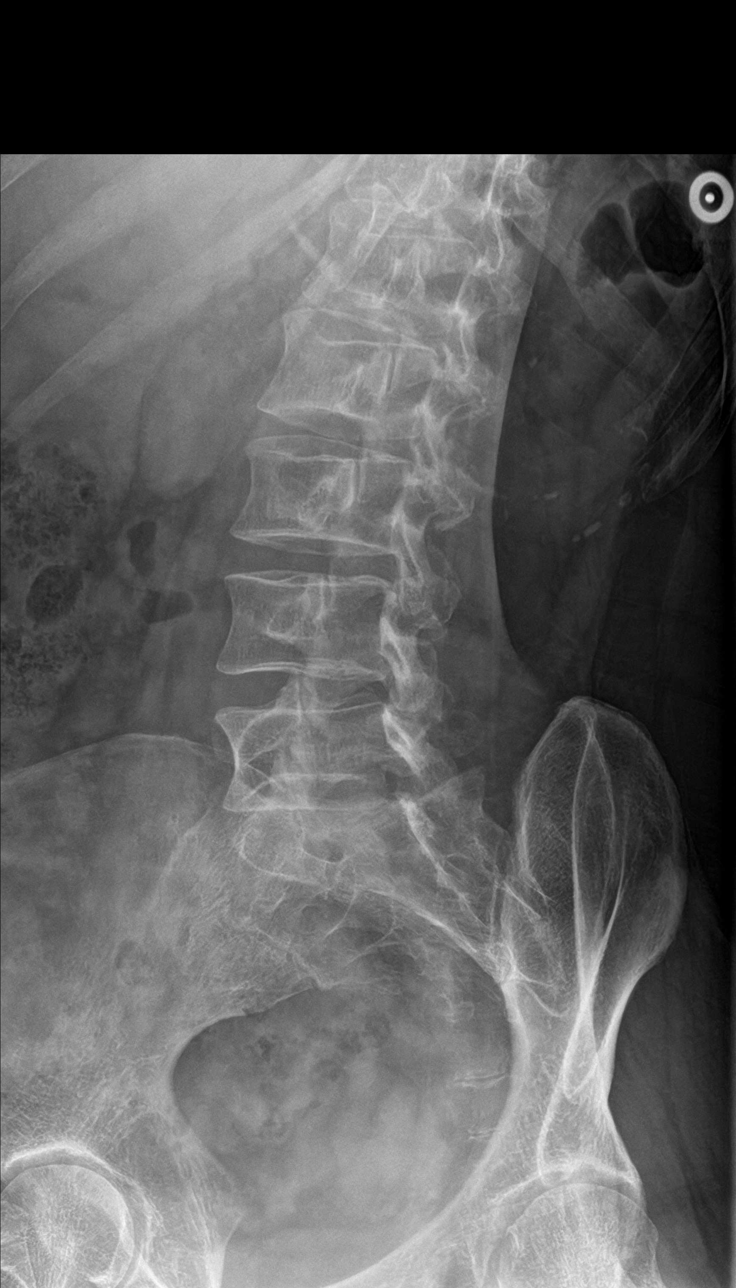

[l-spine obl (2 of 2)]
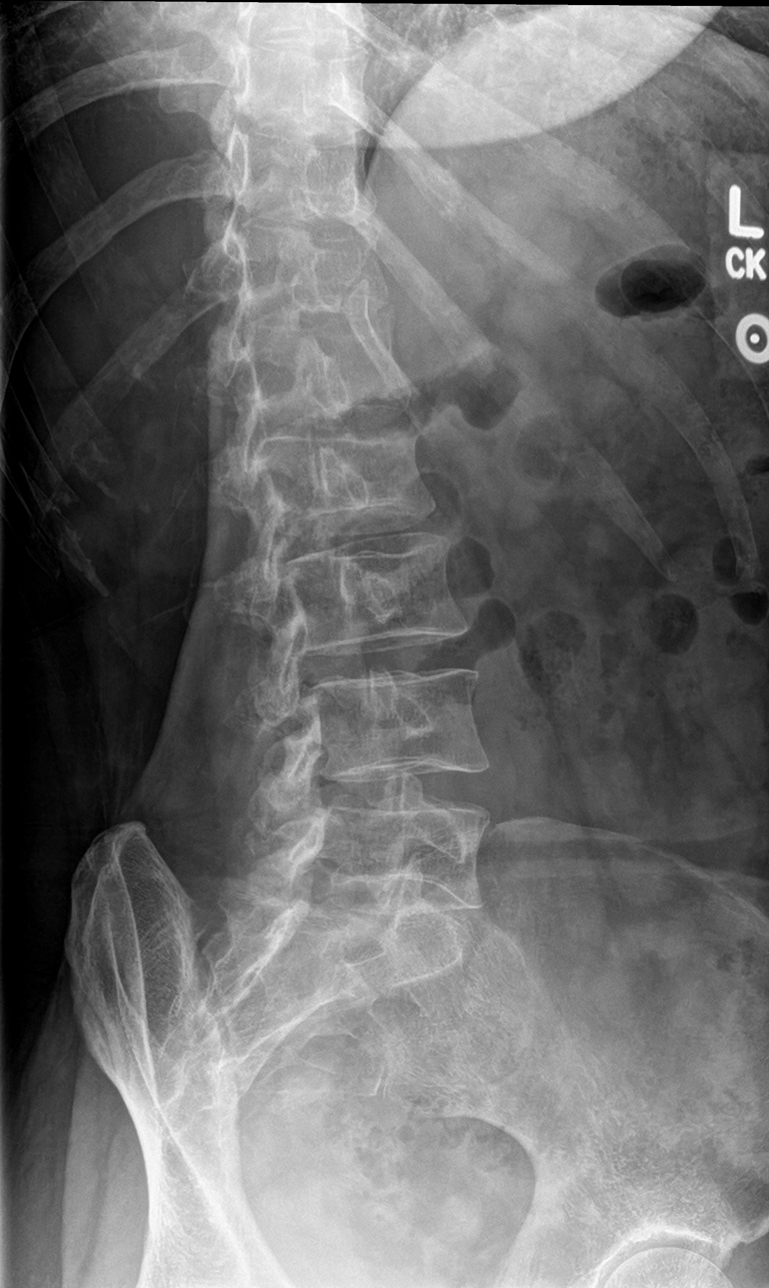

[l-spine lat]
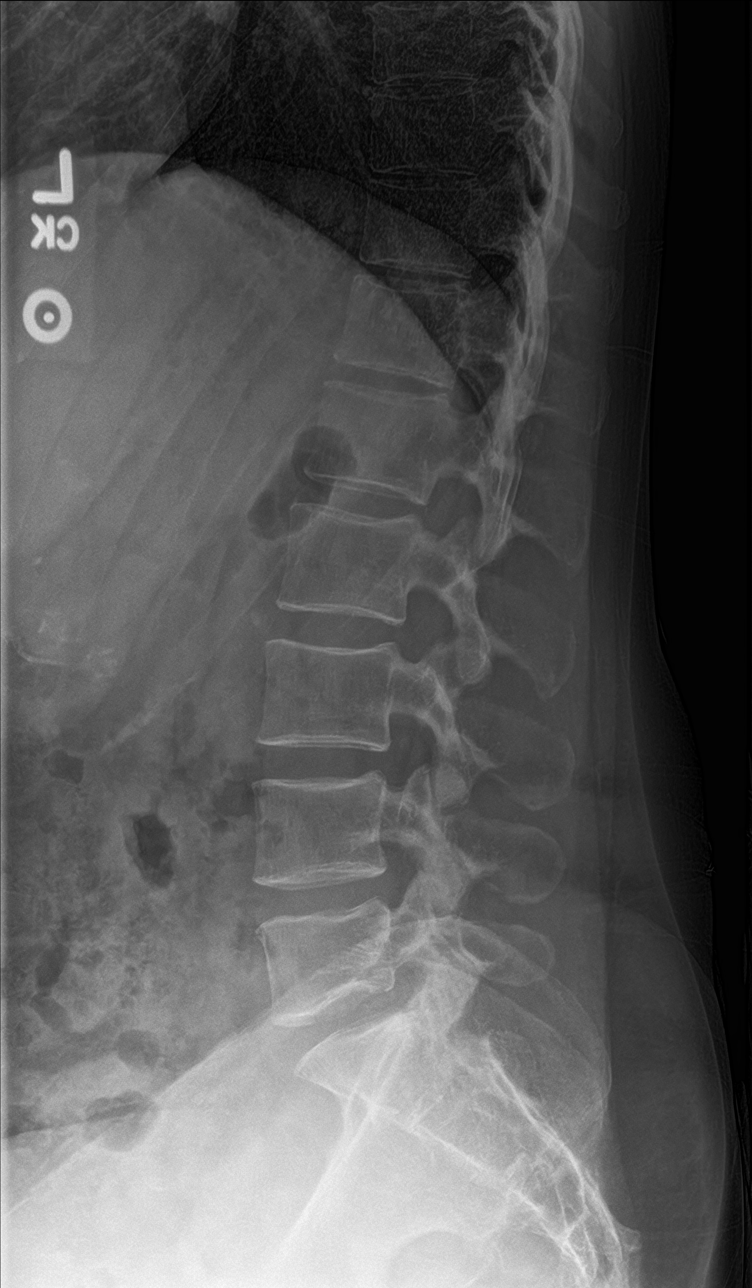

[l-spine spot]
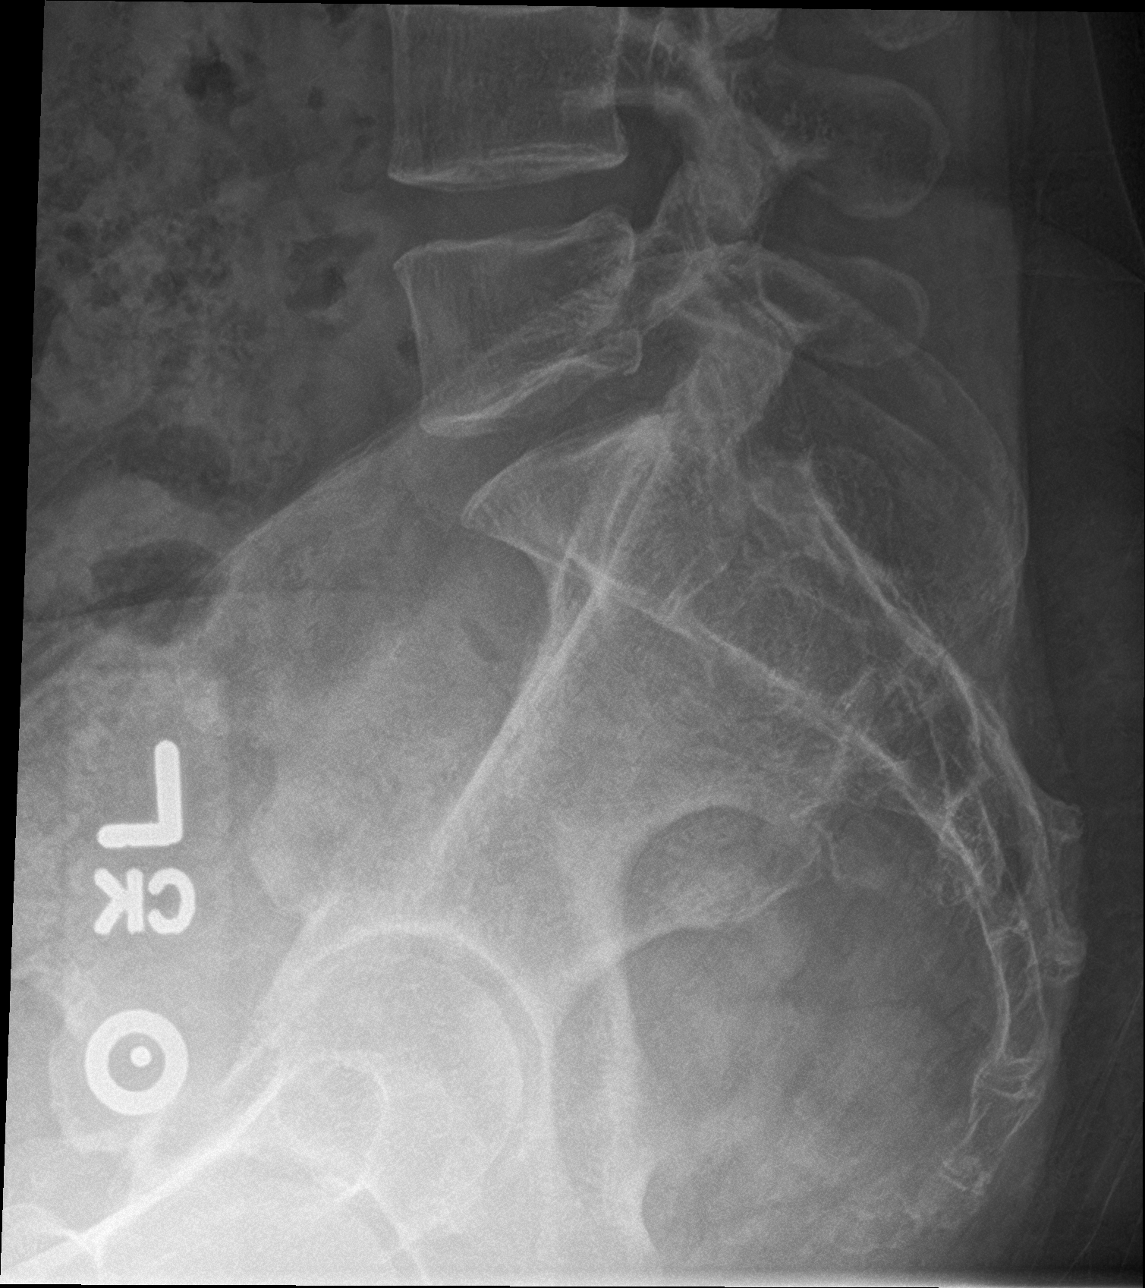

[5 of 5 positions shown; findings below may reference images not displayed]

FINDINGS: Trace broad-based rightward curvature of the upper lumbar spine may
be positional or mild scoliosis. Alignment is otherwise normal.
Vertebral body heights are normal. There is no listhesis. The
posterior elements are intact. Disc spaces are preserved. No
fracture. Sacroiliac joints are symmetric and normal.
IMPRESSION: Trace broad-based rightward curvature of the upper lumbar spine may
be positional or mild scoliosis. Otherwise negative radiographs of
the lumbar spine.

## 2022-01-21 ENCOUNTER — Encounter: Payer: BC Managed Care – PPO | Admitting: Internal Medicine

## 2022-02-11 DIAGNOSIS — N393 Stress incontinence (female) (male): Secondary | ICD-10-CM | POA: Insufficient documentation

## 2022-02-16 DIAGNOSIS — M545 Low back pain, unspecified: Secondary | ICD-10-CM | POA: Diagnosis not present

## 2022-02-24 ENCOUNTER — Encounter: Payer: Self-pay | Admitting: Internal Medicine

## 2022-02-24 ENCOUNTER — Ambulatory Visit (INDEPENDENT_AMBULATORY_CARE_PROVIDER_SITE_OTHER): Payer: BC Managed Care – PPO | Admitting: Internal Medicine

## 2022-02-24 VITALS — BP 108/60 | HR 71 | Temp 98.2°F | Resp 16 | Ht 62.0 in | Wt 127.2 lb

## 2022-02-24 DIAGNOSIS — Z780 Asymptomatic menopausal state: Secondary | ICD-10-CM | POA: Diagnosis not present

## 2022-02-24 DIAGNOSIS — Z0000 Encounter for general adult medical examination without abnormal findings: Secondary | ICD-10-CM

## 2022-02-24 LAB — COMPREHENSIVE METABOLIC PANEL
ALT: 12 U/L (ref 0–35)
AST: 12 U/L (ref 0–37)
Albumin: 4.4 g/dL (ref 3.5–5.2)
Alkaline Phosphatase: 93 U/L (ref 39–117)
BUN: 18 mg/dL (ref 6–23)
CO2: 31 mEq/L (ref 19–32)
Calcium: 9.5 mg/dL (ref 8.4–10.5)
Chloride: 101 mEq/L (ref 96–112)
Creatinine, Ser: 0.68 mg/dL (ref 0.40–1.20)
GFR: 98.11 mL/min (ref >60.00)
Glucose, Bld: 87 mg/dL (ref 70–99)
Potassium: 4 mEq/L (ref 3.5–5.1)
Sodium: 139 mEq/L (ref 135–145)
Total Bilirubin: 1 mg/dL (ref 0.2–1.2)
Total Protein: 6.6 g/dL (ref 6.0–8.3)

## 2022-02-24 LAB — LIPID PANEL
Cholesterol: 187 mg/dL (ref 0–200)
HDL: 55.7 mg/dL (ref >39.00)
LDL Cholesterol: 107 mg/dL — ABNORMAL HIGH (ref 0–99)
NonHDL: 131.15
Total CHOL/HDL Ratio: 3
Triglycerides: 121 mg/dL (ref 0.0–149.0)
VLDL: 24.2 mg/dL (ref 0.0–40.0)

## 2022-02-24 LAB — CBC WITH DIFFERENTIAL/PLATELET
Basophils Absolute: 0 10*3/uL (ref 0.0–0.1)
Basophils Relative: 0.4 % (ref 0.0–3.0)
Eosinophils Absolute: 0.2 10*3/uL (ref 0.0–0.7)
Eosinophils Relative: 3.6 % (ref 0.0–5.0)
HCT: 40.1 % (ref 36.0–46.0)
Hemoglobin: 13.3 g/dL (ref 12.0–15.0)
Lymphocytes Relative: 33.7 % (ref 12.0–46.0)
Lymphs Abs: 1.9 10*3/uL (ref 0.7–4.0)
MCHC: 33.2 g/dL (ref 30.0–36.0)
MCV: 97.9 fl (ref 78.0–100.0)
Monocytes Absolute: 0.5 10*3/uL (ref 0.1–1.0)
Monocytes Relative: 8 % (ref 3.0–12.0)
Neutro Abs: 3.1 10*3/uL (ref 1.4–7.7)
Neutrophils Relative %: 54.3 % (ref 43.0–77.0)
Platelets: 407 10*3/uL — ABNORMAL HIGH (ref 150.0–400.0)
RBC: 4.09 Mil/uL (ref 3.87–5.11)
RDW: 12.8 % (ref 11.5–15.5)
WBC: 5.7 10*3/uL (ref 4.0–10.5)

## 2022-02-24 NOTE — Assessment & Plan Note (Signed)
Here for CPX IBS-C, GERD: Saw GI 07-2021, rec to continue Linzess, dicyclomine, IBgard. Left back pain: new/acute issue, as described above, no clear-cut evidence of a radiculopathy on clinical grounds. Has an appointment to see her ortho soon.  In the meantime we will try to manage pain with Tylenol, ibuprofen with GI precautions, tizanidine if she feels it is helping, hydrocodone if needed.  See AVS. RTC 1 year CPX

## 2022-02-24 NOTE — Assessment & Plan Note (Signed)
-  Td 2022 - s/p Shingrix x2 -COVID VAX booster benefits discussed - flu shot q fall  -CCS:   cscope, 09/2017,Cscope 08/2020, next per GI -Female care per gyn , MMG 07/2021 -Early menopause: Check a DEXA, no vitamin D deficiency, on supplements. -Very active at work but no routine exercise. -Lifestyle: Very active at work, eats healthy -Labs: CMP, FLP, CBC

## 2022-02-24 NOTE — Patient Instructions (Addendum)
See your orthopedic doctor as you are planning. You can manage your pain with the following medications:   Tylenol  500 mg OTC 2 tabs a day every 8 hours as needed for pain  IBUPROFEN (Advil or Motrin) 200 mg 2 tablets every 12 hours as  needed for pain.  Always take it with food because may cause gastritis and ulcers.  If you notice nausea, stomach pain, change in the color of stools --->  Stop the medicine and let us know  Tizanidine at night if needed   Hydrocodone only if severe pain, don't mix w/ tylenol  Proceed with the following vaccines at your pharmacy:  Covid booster (bivalent) Flu shot this fall     GO TO THE LAB : Get the blood work     [place], [place] back for   a physical exam in 1 year  STOP BY THE FIRST FLOOR: Schedule the bone density test

## 2022-02-24 NOTE — Progress Notes (Signed)
Subjective:    Patient ID: Karen Case, female    DOB: [birth year], 56 y.o.   MRN: [id number]  DOS:  02/24/2022 Type of visit - description: cpx  Here for CPX Still has occasional GI symptoms, nothing unusual, history of IBS. A week ago developed intense left buttock pain with some radiation to the posterior leg up to the knee. Went to urgent care, was prescribed medications, marginal help. Denies any rash there, no recent fall or injury, no lower extremity paresthesias.   Review of Systems  Other than above, a 14 point review of systems is negative    Past Medical History:  Diagnosis Date   Birth control    husband's vasectomy   Chronic constipation 06/30/2011   GERD (gastroesophageal reflux disease)     Past Surgical History:  Procedure Laterality Date   BIOPSY TEAR [facility] Right 2012   Right lacrimal gland biopsy, chronic inflammation, no malignancy   BUNIONECTOMY  2000   Right   LIPOMA EXCISION Right 11/22/2019   R arm   Social History   Socioeconomic History   Marital status: Married    Spouse name: Roger   Number of children: 2   Years of education: Not on file   Highest education level: Some college, no degree  Occupational History   Occupation: full time job (school supplies)    Comment: walks several hours qd  Tobacco Use   Smoking status: Never   Smokeless tobacco: Never  Vaping Use   Vaping Use: Never used  Substance and Sexual Activity   Alcohol use: Yes    Alcohol/week: 0.0 standard drinks of alcohol    Comment: socially    Drug use: No   Sexual activity: Not on file  Other Topics Concern   Not on file  Social History Narrative   Original from [place], moved to [place] in 1994    2 daughters   Reeves Forth graduated from [employer], lives in [place]       Patient is right-handed. She lives with her husband in a 2 level home. She drinks one large mug of coffee a day and sometimes tea. She walks her dog, and before COVID restrictions     Social Determinants of Radio broadcast assistant Strain: Not on file  Food Insecurity: Not on file  Transportation Needs: Not on file  Physical Activity: Not on file  Stress: Not on file  Social Connections: Not on file  Intimate Partner Violence: Not on file    Current Outpatient Medications  Medication Instructions   cholecalciferol (VITAMIN D3) 1,000 Units, Oral, Daily   HYDROcodone-acetaminophen (NORCO/VICODIN) 5-325 MG tablet 1 tablet, Oral, Every 6 hours   linaclotide (LINZESS) 145 MCG CAPS capsule TAKE 1 CAPSULE BY MOUTH DAILY BEFORE BREAKFAST.   pantoprazole (PROTONIX) 40 MG tablet TAKE 1 TABLET BY MOUTH EVERY DAY   tiZANidine (ZANAFLEX) 2 mg, Oral, Every 6 hours PRN       Objective:   Physical Exam BP 108/60   Pulse 71   Temp 98.2 F (36.8 C) (Oral)   Resp 16   Ht '5\' 2"'$  (1.575 m)   Wt 127 lb 4 oz (57.7 kg)   LMP 07/15/2012   SpO2 97%   BMI 23.27 kg/m  General: Well developed, NAD, BMI noted Neck: No  thyromegaly  HEENT:  Normocephalic . Face symmetric, atraumatic Lungs:  CTA B Normal respiratory effort, no intercostal retractions, no accessory muscle use. Heart: RRR,  no  murmur.  Abdomen:  Not distended, soft, non-tender. No rebound or rigidity. MSK: No TTP at the lumbar spine, slightly TTP at the L SI joint and  L lateral hip. Hip range of motion normal bilaterally Lower extremities: no pretibial edema bilaterally  Skin: Exposed areas without rash. Not pale. Not jaundice Neurologic:  alert & oriented X3.  Speech normal, gait appropriate for age and unassisted Strength symmetric and appropriate for age. Straight leg test: Negative/inconclusive. DTR symmetric Psych: Cognition and judgment appear intact.  Cooperative with normal attention span and concentration.  Behavior appropriate. No anxious or depressed appearing.     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 Left back pain: new/acute issue, as described above, no clear-cut evidence of a radiculopathy on clinical grounds. Has an appointment to see her ortho soon.  In the meantime we will try to manage pain with Tylenol, ibuprofen with GI precautions, tizanidine if she feels it is helping, hydrocodone if needed.  See AVS. RTC 1 year CPX

## 2022-02-25 ENCOUNTER — Ambulatory Visit (HOSPITAL_BASED_OUTPATIENT_CLINIC_OR_DEPARTMENT_OTHER)
Admission: RE | Admit: 2022-02-25 | Discharge: 2022-02-25 | Disposition: A | Discharge status: Home / Self Care | Payer: BC Managed Care – PPO | Source: Ambulatory Visit | Attending: Internal Medicine | Admitting: Internal Medicine

## 2022-02-25 DIAGNOSIS — Z780 Asymptomatic menopausal state: Secondary | ICD-10-CM | POA: Insufficient documentation

## 2022-02-25 DIAGNOSIS — M8589 Other specified disorders of bone density and structure, multiple sites: Secondary | ICD-10-CM | POA: Diagnosis not present

## 2022-02-27 DIAGNOSIS — M5416 Radiculopathy, lumbar region: Secondary | ICD-10-CM | POA: Diagnosis not present

## 2022-03-01 DIAGNOSIS — M5416 Radiculopathy, lumbar region: Secondary | ICD-10-CM | POA: Insufficient documentation

## 2022-03-06 DIAGNOSIS — M545 Low back pain, unspecified: Secondary | ICD-10-CM | POA: Diagnosis not present

## 2022-03-26 DIAGNOSIS — M5416 Radiculopathy, lumbar region: Secondary | ICD-10-CM | POA: Diagnosis not present

## 2022-04-02 DIAGNOSIS — M5416 Radiculopathy, lumbar region: Secondary | ICD-10-CM | POA: Diagnosis not present

## 2022-04-02 DIAGNOSIS — M79605 Pain in left leg: Secondary | ICD-10-CM | POA: Diagnosis not present

## 2022-04-02 DIAGNOSIS — M5186 Other intervertebral disc disorders, lumbar region: Secondary | ICD-10-CM | POA: Diagnosis not present

## 2022-04-08 DIAGNOSIS — M5416 Radiculopathy, lumbar region: Secondary | ICD-10-CM | POA: Diagnosis not present

## 2022-04-08 DIAGNOSIS — M5186 Other intervertebral disc disorders, lumbar region: Secondary | ICD-10-CM | POA: Diagnosis not present

## 2022-04-08 DIAGNOSIS — M79605 Pain in left leg: Secondary | ICD-10-CM | POA: Diagnosis not present

## 2022-04-15 DIAGNOSIS — M5416 Radiculopathy, lumbar region: Secondary | ICD-10-CM | POA: Diagnosis not present

## 2022-04-15 DIAGNOSIS — M79605 Pain in left leg: Secondary | ICD-10-CM | POA: Diagnosis not present

## 2022-04-15 DIAGNOSIS — M5186 Other intervertebral disc disorders, lumbar region: Secondary | ICD-10-CM | POA: Diagnosis not present

## 2022-04-19 DIAGNOSIS — M5416 Radiculopathy, lumbar region: Secondary | ICD-10-CM | POA: Diagnosis not present

## 2022-04-19 DIAGNOSIS — M79605 Pain in left leg: Secondary | ICD-10-CM | POA: Diagnosis not present

## 2022-04-19 DIAGNOSIS — M5186 Other intervertebral disc disorders, lumbar region: Secondary | ICD-10-CM | POA: Diagnosis not present

## 2022-04-21 DIAGNOSIS — M5416 Radiculopathy, lumbar region: Secondary | ICD-10-CM | POA: Diagnosis not present

## 2022-04-30 DIAGNOSIS — S62305D Unspecified fracture of fourth metacarpal bone, left hand, subsequent encounter for fracture with routine healing: Secondary | ICD-10-CM | POA: Diagnosis not present

## 2022-04-30 DIAGNOSIS — M5416 Radiculopathy, lumbar region: Secondary | ICD-10-CM | POA: Diagnosis not present

## 2022-04-30 DIAGNOSIS — S62301D Unspecified fracture of second metacarpal bone, left hand, subsequent encounter for fracture with routine healing: Secondary | ICD-10-CM | POA: Diagnosis not present

## 2022-05-10 DIAGNOSIS — M5416 Radiculopathy, lumbar region: Secondary | ICD-10-CM | POA: Diagnosis not present

## 2022-05-11 ENCOUNTER — Telehealth: Payer: Self-pay | Admitting: Internal Medicine

## 2022-05-11 DIAGNOSIS — M2550 Pain in unspecified joint: Secondary | ICD-10-CM

## 2022-05-11 NOTE — Telephone Encounter (Signed)
Referral placed.

## 2022-05-11 NOTE — Telephone Encounter (Signed)
Francee Piccolo (spouse DPR OK) called stating pt would like to have a referral put into rheumatology as the one she had a few years prior had expired. Please Advise.

## 2022-05-14 DIAGNOSIS — M5416 Radiculopathy, lumbar region: Secondary | ICD-10-CM | POA: Diagnosis not present

## 2022-05-14 DIAGNOSIS — M79605 Pain in left leg: Secondary | ICD-10-CM | POA: Diagnosis not present

## 2022-05-14 DIAGNOSIS — M5186 Other intervertebral disc disorders, lumbar region: Secondary | ICD-10-CM | POA: Diagnosis not present

## 2022-05-17 NOTE — Telephone Encounter (Signed)
Patient's husband called stating that the earliest the Rheumatologist can see her is on May of next year. They would like something sooner, Please advise.

## 2022-05-17 NOTE — Addendum Note (Signed)
Addended byDamita Dunnings D on: 05/17/2022 01:16 PM   Modules accepted: Orders

## 2022-05-17 NOTE — Telephone Encounter (Signed)
Unsure if another office can see her any sooner. Will try [facility].

## 2022-05-19 ENCOUNTER — Telehealth: Payer: Self-pay | Admitting: Internal Medicine

## 2022-05-19 NOTE — Telephone Encounter (Signed)
[facility] called requesting OV notes and lab results be sent to the following fax number:  ATTN: Lovey Newcomer [phone]

## 2022-05-19 NOTE — Telephone Encounter (Signed)
Records faxed.

## 2022-05-25 DIAGNOSIS — M5416 Radiculopathy, lumbar region: Secondary | ICD-10-CM | POA: Diagnosis not present

## 2022-05-25 DIAGNOSIS — M79605 Pain in left leg: Secondary | ICD-10-CM | POA: Diagnosis not present

## 2022-05-25 DIAGNOSIS — M5186 Other intervertebral disc disorders, lumbar region: Secondary | ICD-10-CM | POA: Diagnosis not present

## 2022-06-02 DIAGNOSIS — S838X1A Sprain of other specified parts of right knee, initial encounter: Secondary | ICD-10-CM | POA: Diagnosis not present

## 2022-06-11 DIAGNOSIS — M25561 Pain in right knee: Secondary | ICD-10-CM | POA: Diagnosis not present

## 2022-06-15 ENCOUNTER — Other Ambulatory Visit: Payer: Self-pay | Admitting: Gastroenterology

## 2022-06-18 DIAGNOSIS — M25561 Pain in right knee: Secondary | ICD-10-CM | POA: Diagnosis not present

## 2022-06-21 DIAGNOSIS — M25561 Pain in right knee: Secondary | ICD-10-CM | POA: Diagnosis not present

## 2022-06-21 DIAGNOSIS — M79641 Pain in right hand: Secondary | ICD-10-CM | POA: Diagnosis not present

## 2022-06-21 DIAGNOSIS — M79642 Pain in left hand: Secondary | ICD-10-CM | POA: Diagnosis not present

## 2022-06-21 DIAGNOSIS — M1991 Primary osteoarthritis, unspecified site: Secondary | ICD-10-CM | POA: Diagnosis not present

## 2022-06-22 DIAGNOSIS — S83281A Other tear of lateral meniscus, current injury, right knee, initial encounter: Secondary | ICD-10-CM | POA: Diagnosis not present

## 2022-06-22 DIAGNOSIS — S83241A Other tear of medial meniscus, current injury, right knee, initial encounter: Secondary | ICD-10-CM | POA: Diagnosis not present

## 2022-06-25 DIAGNOSIS — W010XXA Fall on same level from slipping, tripping and stumbling without subsequent striking against object, initial encounter: Secondary | ICD-10-CM | POA: Diagnosis not present

## 2022-06-25 DIAGNOSIS — S8392XA Sprain of unspecified site of left knee, initial encounter: Secondary | ICD-10-CM | POA: Diagnosis not present

## 2022-07-01 DIAGNOSIS — M1712 Unilateral primary osteoarthritis, left knee: Secondary | ICD-10-CM | POA: Diagnosis not present

## 2022-07-01 DIAGNOSIS — M25562 Pain in left knee: Secondary | ICD-10-CM | POA: Diagnosis not present

## 2022-07-07 ENCOUNTER — Other Ambulatory Visit: Payer: Self-pay | Admitting: Internal Medicine

## 2022-07-07 DIAGNOSIS — Z1231 Encounter for screening mammogram for malignant neoplasm of breast: Secondary | ICD-10-CM

## 2022-08-25 ENCOUNTER — Ambulatory Visit: Payer: BC Managed Care – PPO

## 2022-08-27 ENCOUNTER — Telehealth: Payer: Self-pay | Admitting: Gastroenterology

## 2022-08-27 MED ORDER — LINACLOTIDE 145 MCG PO CAPS: ORAL_CAPSULE | ORAL | 0 refills | Status: DC

## 2022-08-27 NOTE — Telephone Encounter (Signed)
Informed patient's husband that his wife is due for follow up visit and we can send a refill to her pharmacy. Appt scheduled for 09/27/22 and [facility]

## 2022-08-27 NOTE — Telephone Encounter (Signed)
Patient's husband is calling states that the Linzess is supposed to be a 3 month Rx instead of one month because it is too expensive that way. Please advise

## 2022-09-05 ENCOUNTER — Other Ambulatory Visit: Payer: Self-pay | Admitting: Gastroenterology

## 2022-09-10 NOTE — Telephone Encounter (Signed)
PT husband is calling about Linzess. [employer] has been hacked and the coupon doesn't work. He is wondering if they can have samples. Please advise.

## 2022-09-10 NOTE — Telephone Encounter (Signed)
Left message for patient's husband to return my call.

## 2022-09-13 NOTE — Telephone Encounter (Signed)
Patient's husband states the coupons for Linzess are no longer valid due to a computer hack according to the pharmacy. Karen Case states his wife has an appointment with Dr. Fuller Plan in a couple of weeks and can discuss other medication options for constipation. In the mean time Karen Case asks if we have any samples of Linzess his wife can have while they are on vacation. Informed Karen Case I will leave a couple of boxes at our front desk for her.

## 2022-09-27 ENCOUNTER — Encounter: Payer: Self-pay | Admitting: Gastroenterology

## 2022-09-27 ENCOUNTER — Ambulatory Visit
Admission: RE | Admit: 2022-09-27 | Discharge: 2022-09-27 | Disposition: A | Discharge status: Home / Self Care | Payer: BC Managed Care – PPO | Source: Ambulatory Visit | Attending: Internal Medicine | Admitting: Internal Medicine

## 2022-09-27 ENCOUNTER — Ambulatory Visit (INDEPENDENT_AMBULATORY_CARE_PROVIDER_SITE_OTHER): Payer: BC Managed Care – PPO | Admitting: Gastroenterology

## 2022-09-27 VITALS — BP 86/60 | HR 88 | Ht 62.5 in | Wt 137.1 lb

## 2022-09-27 DIAGNOSIS — K581 Irritable bowel syndrome with constipation: Secondary | ICD-10-CM | POA: Diagnosis not present

## 2022-09-27 DIAGNOSIS — Z1231 Encounter for screening mammogram for malignant neoplasm of breast: Secondary | ICD-10-CM | POA: Diagnosis not present

## 2022-09-27 DIAGNOSIS — K219 Gastro-esophageal reflux disease without esophagitis: Secondary | ICD-10-CM

## 2022-09-27 MED ORDER — TRULANCE 3 MG PO TABS: 3.0000 mg | ORAL_TABLET | Freq: Every day | ORAL | 11 refills | Status: DC

## 2022-09-27 NOTE — Patient Instructions (Addendum)
We have sent the following medications to your pharmacy for you to pick up at your convenience: Trulance 3mg  - call us if meds is not covered or isnt working for you.   We shceduled you a follow up visit on 11/30/2022 at 8:30am with Dr Fuller Plan.  Stop linzess. _______________________________________________________  If your blood pressure at your visit was 140/90 or greater, please contact your primary care physician to follow up on this.  _______________________________________________________  If you are age 56 or older, your body mass index should be between 23-30. Your Body mass index is 24.68 kg/m. If this is out of the aforementioned range listed, please consider follow up with your Primary Care Provider.  If you are age 21 or younger, your body mass index should be between 19-25. Your Body mass index is 24.68 kg/m. If this is out of the aformentioned range listed, please consider follow up with your Primary Care Provider.   ________________________________________________________  The [facility] providers would like to encourage you to use [facility] to communicate with providers for non-urgent requests or questions.  Due to long hold times on the telephone, sending your provider a message by [facility] may be a faster and more efficient way to get a response.  Please allow 48 business hours for a response.  Please remember that this is for non-urgent requests.  _______________________________________________________ Thank you for choosing me and [facility].  Pricilla Riffle. Dagoberto Ligas., MD., [facility]

## 2022-09-27 NOTE — Progress Notes (Signed)
    Assessment     IBS-C GERD Personal history of adenomatous colon polyps   Recommendations    DC Linzess d/t cost Trulance 3 mg qd  Continue pantoprazole 40 mg qd and follow antireflux measures Surveillance colonoscopy recommended in February 2029 REV in 2 months   HPI    This is a 57 year old female with IBS-C and GERD.  She relates that Linzess 145 mcg qd samples worked well to manage her abdominal pain and constipation however the monthly prescription was $300 which she felt was too expensive.  Her reflux symptoms are well-controlled on daily pantoprazole.  She has tried over-the-counter MiraLAX, milk of magnesia and Dulcolax which were either not effective or led to side effects.     Labs / Imaging       Latest Ref Rng & Units 02/24/2022   10:58 AM 12/29/2020    8:37 AM 05/02/2020   11:33 AM  Hepatic Function  Total Protein 6.0 - 8.3 g/dL 6.6  6.5  6.7   Albumin 3.5 - 5.2 g/dL 4.4  4.3    AST 0 - 37 U/L 12  13  15    ALT 0 - 35 U/L 12  13  18    Alk Phosphatase 39 - 117 U/L 93  89    Total Bilirubin 0.2 - 1.2 mg/dL 1.0  0.7  0.7        Latest Ref Rng & Units 02/24/2022   10:58 AM 12/29/2020    8:37 AM 05/02/2020   11:33 AM  CBC  WBC 4.0 - 10.5 K/uL 5.7  3.8  4.9   Hemoglobin 12.0 - 15.0 g/dL 13.3  13.2  12.6   Hematocrit 36.0 - 46.0 % 40.1  39.1  38.5   Platelets 150.0 - 400.0 K/uL 407.0  363.0  385     Current Medications, Allergies, Past Medical History, Past Surgical History, Family History and Social History were reviewed in [employer] record.   Physical Exam: General: Well developed, well nourished, no acute distress Head: Normocephalic and atraumatic Eyes: Sclerae anicteric, EOMI Ears: Normal auditory acuity Psychological:  Alert and cooperative. Normal mood and affect   Arjan Strohm T. Fuller Plan, MD 09/27/2022, 2:48 PM

## 2022-09-28 ENCOUNTER — Telehealth: Payer: Self-pay | Admitting: Gastroenterology

## 2022-09-28 NOTE — Telephone Encounter (Signed)
Inbound call from patient spouse requesting to speak with a nurse in regards to mediation.   Please advise.

## 2022-09-29 NOTE — Telephone Encounter (Signed)
PT husband is calling back for an update on medication. He is referencing the Trulance and that it is too expensive. The coupon is not working for them and PT needs medication. He is wondering if there Is another option for her. Please advise.

## 2022-09-30 ENCOUNTER — Other Ambulatory Visit: Payer: Self-pay

## 2022-09-30 ENCOUNTER — Other Ambulatory Visit: Payer: Self-pay | Admitting: Gastroenterology

## 2022-09-30 MED ORDER — MOTEGRITY 2 MG PO TABS: 1.0000 | ORAL_TABLET | Freq: Every day | ORAL | 1 refills | Status: DC

## 2022-09-30 NOTE — Telephone Encounter (Signed)
Motegrity 2 mg po qd. If this is too expensive ask them to check with their pharmacist or insurance company for constipation medication options that are adequately covered, less expensive.

## 2022-09-30 NOTE — Telephone Encounter (Signed)
Pharmacy sent change request to Amitiza. This medication is covered by her insurance and Motegrity was not. Can we switch patient Dr. Fuller Plan?

## 2022-10-07 ENCOUNTER — Other Ambulatory Visit: Payer: Self-pay

## 2022-10-07 ENCOUNTER — Telehealth: Payer: Self-pay | Admitting: Gastroenterology

## 2022-10-07 MED ORDER — MOTEGRITY 2 MG PO TABS: 1.0000 | ORAL_TABLET | Freq: Every day | ORAL | 1 refills | Status: DC

## 2022-10-07 NOTE — Telephone Encounter (Signed)
Spoke with patient's husband who said he picked up something up from the pharmacy but wasn't sure what the medication was - it was still expensive but the cheapest of all the alternatives tried.  Spoke with pharmacist to try to clarify this information:  Motegrity was sent and is not covered by insurance so their system automatically switched the rx to Amitiza 75mcg, which is preferred.  This is what the patient's husband picked up.  He states it is not working well on his wife's constipation.  The only other option I can see is to try Amiza 24 mcg maybe?  Please advise.  Thanks!

## 2022-10-07 NOTE — Telephone Encounter (Signed)
Per Dr. Fuller Plan, ok to patient to try Amitiza 10mcg.  Since patient had just picked up a 90 supply of the 77mcg, I told her husband for patient to take 3 capsules twice a day to get up to that dosage and make sure it worked before we sent in a new prescription.  If it does they will call back when they are almost out of the 53mcg to get a new prescription.

## 2022-10-07 NOTE — Telephone Encounter (Signed)
PT husband is calling to see if they can have samples of motegrity because the Amitiza is not working for her.

## 2022-10-08 ENCOUNTER — Telehealth: Payer: Self-pay | Admitting: Physician Assistant

## 2022-10-08 NOTE — Telephone Encounter (Signed)
Patient's husband called this morning concerned about the medication his wife was prescribed for constipation.  It sounds as if they had initially been sent a prescription for Linzess which was exorbitantly expensive.  The most recent prescription was supposed to be for Amitiza 8 mcg to be dosed 3 tablets twice daily because this was cheaper than the 24 mcg tablet  Concerned because the prescription he just picked up was for lubiprostone  Advised that lubiprostone is the generic for Amitiza and that they should go ahead and use this prescription starting with 3 tablets twice daily  Advised this is not a laxative and may take a few days to work.  Patient's husband will call back for further questions or concerns

## 2022-10-26 ENCOUNTER — Encounter: Payer: Self-pay | Admitting: Internal Medicine

## 2022-11-01 DIAGNOSIS — S93601A Unspecified sprain of right foot, initial encounter: Secondary | ICD-10-CM | POA: Diagnosis not present

## 2022-11-06 DIAGNOSIS — S99921D Unspecified injury of right foot, subsequent encounter: Secondary | ICD-10-CM | POA: Diagnosis not present

## 2022-11-06 DIAGNOSIS — S93601D Unspecified sprain of right foot, subsequent encounter: Secondary | ICD-10-CM | POA: Diagnosis not present

## 2022-11-10 ENCOUNTER — Other Ambulatory Visit: Payer: Self-pay | Admitting: Nurse Practitioner

## 2022-11-10 ENCOUNTER — Other Ambulatory Visit (HOSPITAL_COMMUNITY)
Admission: RE | Admit: 2022-11-10 | Discharge: 2022-11-10 | Disposition: A | Discharge status: Home / Self Care | Payer: BC Managed Care – PPO | Source: Ambulatory Visit | Attending: Nurse Practitioner | Admitting: Nurse Practitioner

## 2022-11-10 DIAGNOSIS — Z124 Encounter for screening for malignant neoplasm of cervix: Secondary | ICD-10-CM | POA: Diagnosis not present

## 2022-11-10 DIAGNOSIS — Z01419 Encounter for gynecological examination (general) (routine) without abnormal findings: Secondary | ICD-10-CM | POA: Diagnosis not present

## 2022-11-12 LAB — CYTOLOGY - PAP
Comment: NEGATIVE
Diagnosis: NEGATIVE
High risk HPV: NEGATIVE

## 2022-11-17 DIAGNOSIS — M25562 Pain in left knee: Secondary | ICD-10-CM | POA: Diagnosis not present

## 2022-11-17 DIAGNOSIS — S93601A Unspecified sprain of right foot, initial encounter: Secondary | ICD-10-CM | POA: Diagnosis not present

## 2022-11-17 DIAGNOSIS — M1712 Unilateral primary osteoarthritis, left knee: Secondary | ICD-10-CM | POA: Diagnosis not present

## 2022-11-18 ENCOUNTER — Encounter: Payer: BC Managed Care – PPO | Admitting: Rheumatology

## 2022-11-30 ENCOUNTER — Ambulatory Visit: Payer: BC Managed Care – PPO | Admitting: Gastroenterology

## 2022-12-07 DIAGNOSIS — M545 Low back pain, unspecified: Secondary | ICD-10-CM | POA: Diagnosis not present

## 2022-12-10 ENCOUNTER — Other Ambulatory Visit: Payer: Self-pay | Admitting: Gastroenterology

## 2022-12-11 DIAGNOSIS — R1032 Left lower quadrant pain: Secondary | ICD-10-CM | POA: Diagnosis not present

## 2022-12-13 ENCOUNTER — Encounter: Payer: Self-pay | Admitting: Internal Medicine

## 2022-12-13 ENCOUNTER — Ambulatory Visit (INDEPENDENT_AMBULATORY_CARE_PROVIDER_SITE_OTHER): Payer: BC Managed Care – PPO | Admitting: Internal Medicine

## 2022-12-13 VITALS — BP 116/76 | HR 72 | Temp 98.1°F | Resp 12 | Ht 62.5 in | Wt 139.2 lb

## 2022-12-13 DIAGNOSIS — R399 Unspecified symptoms and signs involving the genitourinary system: Secondary | ICD-10-CM

## 2022-12-13 DIAGNOSIS — R350 Frequency of micturition: Secondary | ICD-10-CM

## 2022-12-13 DIAGNOSIS — R6889 Other general symptoms and signs: Secondary | ICD-10-CM | POA: Diagnosis not present

## 2022-12-13 DIAGNOSIS — R1084 Generalized abdominal pain: Secondary | ICD-10-CM

## 2022-12-13 DIAGNOSIS — R3589 Other polyuria: Secondary | ICD-10-CM

## 2022-12-13 LAB — CBC WITH DIFFERENTIAL/PLATELET
Basophils Absolute: 0 10*3/uL (ref 0.0–0.1)
Basophils Relative: 0.5 % (ref 0.0–3.0)
Eosinophils Absolute: 0.1 10*3/uL (ref 0.0–0.7)
Eosinophils Relative: 2.5 % (ref 0.0–5.0)
HCT: 37.7 % (ref 36.0–46.0)
Hemoglobin: 12.6 g/dL (ref 12.0–15.0)
Lymphocytes Relative: 30.9 % (ref 12.0–46.0)
Lymphs Abs: 1.5 10*3/uL (ref 0.7–4.0)
MCHC: 33.6 g/dL (ref 30.0–36.0)
MCV: 97.1 fl (ref 78.0–100.0)
Monocytes Absolute: 0.3 10*3/uL (ref 0.1–1.0)
Monocytes Relative: 7 % (ref 3.0–12.0)
Neutro Abs: 2.9 10*3/uL (ref 1.4–7.7)
Neutrophils Relative %: 59.1 % (ref 43.0–77.0)
Platelets: 360 10*3/uL (ref 150.0–400.0)
RBC: 3.88 Mil/uL (ref 3.87–5.11)
RDW: 12.4 % (ref 11.5–15.5)
WBC: 4.9 10*3/uL (ref 4.0–10.5)

## 2022-12-13 LAB — POCT URINALYSIS DIP (MANUAL ENTRY)
Blood, UA: NEGATIVE
Glucose, UA: NEGATIVE mg/dL
Ketones, POC UA: NEGATIVE mg/dL
Nitrite, UA: NEGATIVE
Protein Ur, POC: NEGATIVE mg/dL
Spec Grav, UA: 1.02 (ref 1.010–1.025)
Urobilinogen, UA: 0.2 E.U./dL
pH, UA: 5 (ref 5.0–8.0)

## 2022-12-13 LAB — COMPREHENSIVE METABOLIC PANEL
ALT: 27 U/L (ref 0–35)
AST: 19 U/L (ref 0–37)
Albumin: 4.5 g/dL (ref 3.5–5.2)
Alkaline Phosphatase: 78 U/L (ref 39–117)
BUN: 17 mg/dL (ref 6–23)
CO2: 29 mEq/L (ref 19–32)
Calcium: 9.2 mg/dL (ref 8.4–10.5)
Chloride: 101 mEq/L (ref 96–112)
Creatinine, Ser: 0.67 mg/dL (ref 0.40–1.20)
GFR: 97.92 mL/min (ref >60.00)
Glucose, Bld: 94 mg/dL (ref 70–99)
Potassium: 3.8 mEq/L (ref 3.5–5.1)
Sodium: 139 mEq/L (ref 135–145)
Total Bilirubin: 0.8 mg/dL (ref 0.2–1.2)
Total Protein: 6.9 g/dL (ref 6.0–8.3)

## 2022-12-13 LAB — URINALYSIS, ROUTINE W REFLEX MICROSCOPIC
Bilirubin Urine: NEGATIVE
Hgb urine dipstick: NEGATIVE
Ketones, ur: NEGATIVE
Leukocytes,Ua: NEGATIVE
Nitrite: NEGATIVE
RBC / HPF: NONE SEEN (ref 0–?)
Specific Gravity, Urine: 1.025 (ref 1.000–1.030)
Total Protein, Urine: NEGATIVE
Urine Glucose: NEGATIVE
Urobilinogen, UA: 0.2 (ref 0.0–1.0)
pH: 5.5 (ref 5.0–8.0)

## 2022-12-13 LAB — TSH: TSH: 1.45 u[IU]/mL (ref 0.35–5.50)

## 2022-12-13 LAB — HEMOGLOBIN A1C: Hgb A1c MFr Bld: 4.9 % (ref 4.6–6.5)

## 2022-12-13 NOTE — Patient Instructions (Signed)
If severe symptoms, fever chills, please go to the ER.   GO TO THE LAB : Get the blood work     GO TO THE FRONT DESK, PLEASE SCHEDULE YOUR APPOINTMENTS Come back for a checkup in 4 weeks

## 2022-12-13 NOTE — Assessment & Plan Note (Signed)
Abdominal discomfort, mild LUTS Has chronic constipation, apparently well-controlled and now has 2-week history of abdominal discomfort and a  ill-defined LUTS. In the context of IBS it is hard to tease out the etiology. 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 Admits to some stress, I wonder if that is playing a role in her symptoms. Polydipsia, polyuria: Check A1c Cold intolerance: Check TSH Further advised for results RTC 4 weeks.

## 2022-12-13 NOTE — Progress Notes (Signed)
   Subjective:    Patient ID: Karen Case, female    DOB: 14-Jun-1966, 57 y.o.   MRN: [id number]  DOS:  12/13/2022 Type of visit - description: Acute In the last 2 weeks has developed several symptoms.  Generalized abdominal discomfort, persistent, intensity goes up or down during the day.  Mostly at the left side of the abdomen, not described as colic. Eating increases pain, bowel movements have no effect on it. Admits to some nausea, vomited twice last week with no hematemesis. On Linzess, reports constipation is well-controlled.  Also has some lower abdominal burning bilaterally, no dysuria per se but some discomfort with urination. Denies any vaginal discharge or vaginal bleeding.  GERD: On pantoprazole, not well-controlled ,  still has some symptoms  Also has early satiety and feeling bloated, eating very little according to the patient. Some cold intolerance  Also reports polydipsia and polyuria.   Review of Systems See above   Past Medical History:  Diagnosis Date   Birth control    husband's vasectomy   Chronic constipation 06/30/2011   GERD (gastroesophageal reflux disease)    Lumbar radiculopathy     Past Surgical History:  Procedure Laterality Date   BIOPSY TEAR [facility] Right 2012   Right lacrimal gland biopsy, chronic inflammation, no malignancy   BUNIONECTOMY  2000   Right   LIPOMA EXCISION Right 11/22/2019   R arm    Current Outpatient Medications  Medication Instructions   cholecalciferol (VITAMIN D3) 1,000 Units, Oral, Daily   Linzess 145 mcg, Oral, Every morning   pantoprazole (PROTONIX) 40 MG tablet TAKE 1 TABLET BY MOUTH EVERY DAY       Objective:   Physical Exam BP 116/76 (BP Location: Right Arm, Cuff Size: Normal)   Pulse 72   Temp 98.1 F (36.7 C) (Oral)   Resp 12   Ht 5' 2.5" (1.588 m)   Wt 139 lb 3.2 oz (63.1 kg)   LMP 07/15/2012   SpO2 96%   BMI 25.05 kg/m  General:   Well developed, NAD, BMI noted.  HEENT:  Normocephalic .  Face symmetric, atraumatic Lungs:  CTA B Normal respiratory effort, no intercostal retractions, no accessory muscle use. Heart: RRR,  no murmur.  Abdomen:  Not distended, soft, mild tenderness without rebound at both sides of the lower abdomen.  Minimal tenderness at the upper abdomen.  No mass. Skin: Not pale. Not jaundice Lower extremities: no pretibial edema bilaterally  Neurologic:  alert & oriented X3.  Speech normal, gait appropriate for age and unassisted Psych--  Cognition and judgment appear intact.  Cooperative with normal attention span and concentration.  Behavior appropriate. No anxious or depressed appearing.     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 In the context of IBS it is hard to tease out the etiology. 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 Admits to some stress, I wonder if that is playing a role in her symptoms. Polydipsia, polyuria: Check A1c Cold intolerance: Check TSH Further advised for results RTC 4 weeks.

## 2022-12-14 LAB — URINE CULTURE
MICRO NUMBER:: 15032599
SPECIMEN QUALITY:: ADEQUATE

## 2022-12-15 DIAGNOSIS — R3989 Other symptoms and signs involving the genitourinary system: Secondary | ICD-10-CM | POA: Diagnosis not present

## 2022-12-17 ENCOUNTER — Emergency Department (HOSPITAL_BASED_OUTPATIENT_CLINIC_OR_DEPARTMENT_OTHER): Payer: BC Managed Care – PPO

## 2022-12-17 ENCOUNTER — Emergency Department (HOSPITAL_BASED_OUTPATIENT_CLINIC_OR_DEPARTMENT_OTHER)
Admission: EM | Admit: 2022-12-17 | Discharge: 2022-12-17 | Disposition: A | Discharge status: Home / Self Care | Payer: BC Managed Care – PPO | Attending: Emergency Medicine | Admitting: Emergency Medicine

## 2022-12-17 ENCOUNTER — Encounter (HOSPITAL_BASED_OUTPATIENT_CLINIC_OR_DEPARTMENT_OTHER): Payer: Self-pay | Admitting: Emergency Medicine

## 2022-12-17 DIAGNOSIS — R1110 Vomiting, unspecified: Secondary | ICD-10-CM | POA: Diagnosis not present

## 2022-12-17 DIAGNOSIS — R1084 Generalized abdominal pain: Secondary | ICD-10-CM | POA: Insufficient documentation

## 2022-12-17 DIAGNOSIS — R1032 Left lower quadrant pain: Secondary | ICD-10-CM | POA: Diagnosis not present

## 2022-12-17 LAB — COMPREHENSIVE METABOLIC PANEL
ALT: 23 U/L (ref 0–44)
AST: 20 U/L (ref 15–41)
Albumin: 4.1 g/dL (ref 3.5–5.0)
Alkaline Phosphatase: 78 U/L (ref 38–126)
Anion gap: 6 (ref 5–15)
BUN: 12 mg/dL (ref 6–20)
CO2: 30 mmol/L (ref 22–32)
Calcium: 8.9 mg/dL (ref 8.9–10.3)
Chloride: 101 mmol/L (ref 98–111)
Creatinine, Ser: 0.81 mg/dL (ref 0.44–1.00)
GFR, Estimated: >60 mL/min (ref >60)
Glucose, Bld: 96 mg/dL (ref 70–99)
Potassium: 3.7 mmol/L (ref 3.5–5.1)
Sodium: 137 mmol/L (ref 135–145)
Total Bilirubin: 0.7 mg/dL (ref 0.3–1.2)
Total Protein: 7 g/dL (ref 6.5–8.1)

## 2022-12-17 LAB — URINALYSIS, ROUTINE W REFLEX MICROSCOPIC
Bilirubin Urine: NEGATIVE
Glucose, UA: NEGATIVE mg/dL
Ketones, ur: NEGATIVE mg/dL
Leukocytes,Ua: NEGATIVE
Nitrite: NEGATIVE
Protein, ur: NEGATIVE mg/dL
Specific Gravity, Urine: >=1.03 (ref 1.005–1.030)
pH: 5.5 (ref 5.0–8.0)

## 2022-12-17 LAB — URINALYSIS, MICROSCOPIC (REFLEX)

## 2022-12-17 LAB — CBC
HCT: 38.8 % (ref 36.0–46.0)
Hemoglobin: 13 g/dL (ref 12.0–15.0)
MCH: 32.4 pg (ref 26.0–34.0)
MCHC: 33.5 g/dL (ref 30.0–36.0)
MCV: 96.8 fL (ref 80.0–100.0)
Platelets: 378 10*3/uL (ref 150–400)
RBC: 4.01 MIL/uL (ref 3.87–5.11)
RDW: 11.4 % — ABNORMAL LOW (ref 11.5–15.5)
WBC: 5.6 10*3/uL (ref 4.0–10.5)
nRBC: 0 % (ref 0.0–0.2)

## 2022-12-17 LAB — LIPASE, BLOOD: Lipase: 27 U/L (ref 11–51)

## 2022-12-17 MED ORDER — IOHEXOL 300 MG/ML  SOLN
100.0000 mL | Freq: Once | INTRAMUSCULAR | Status: AC | PRN
  Administered 2022-12-17: 100 mL via INTRAVENOUS

## 2022-12-17 MED ORDER — DICYCLOMINE HCL 10 MG PO CAPS
10.0000 mg | ORAL_CAPSULE | Freq: Once | ORAL | Status: AC
  Administered 2022-12-17: 10 mg via ORAL
  Filled 2022-12-17: qty 1

## 2022-12-17 MED ORDER — DICYCLOMINE HCL 20 MG PO TABS: 20.0000 mg | ORAL_TABLET | Freq: Two times a day (BID) | ORAL | 0 refills | Status: DC

## 2022-12-17 MED ORDER — OMEPRAZOLE 20 MG PO CPDR: 20.0000 mg | DELAYED_RELEASE_CAPSULE | Freq: Every day | ORAL | 0 refills | Status: DC

## 2022-12-17 NOTE — ED Triage Notes (Signed)
Abd pain x 2 weeks saw a dr  was told to come to er , having n/v yesterday  has a full feeling when she eats

## 2022-12-17 NOTE — ED Provider Notes (Signed)
[place] [facility] Provider Note   CSN: [id number] Arrival date & time: 12/17/22  1227     History  Chief Complaint  Patient presents with   Abdominal Pain    Karen Case is a 58 y.o. female.   Abdominal Pain   57 year old female presents emergency department with complaints of left-sided abdominal pain.  Patient states that symptoms been present for the past 2 weeks.  States she has a history of IBS with constipation features however she has been taking Linzess.  Regular bowel movements.  Was seen by primary care with laboratory studies performed at that time which were reassuring.  States that she developed nausea with 1-2 bouts of emesis beginning yesterday.  She states that any food/liquid she consumes provokes feelings of nausea.  Reports pain mainly in left lower abdomen with some radiation from left flank area.  Denies any fever, chest pain, shortness of breath, hematemesis, urinary symptoms, change in bowel habits.  Denies history of abdominal surgeries.  Past medical history significant for GERD, lumbar radiculopathy, OCP use  Home Medications Prior to Admission medications   Medication Sig Start Date End Date Taking? Authorizing Provider  cholecalciferol (VITAMIN D3) 25 MCG (1000 UT) tablet Take 1,000 Units by mouth daily.    [provider]  LINZESS 145 MCG CAPS capsule Take 145 mcg by mouth every morning. 10/18/22   [provider]  pantoprazole (PROTONIX) 40 MG tablet TAKE 1 TABLET BY MOUTH EVERY DAY 12/10/22   Meryl Dare, MD      Allergies    Patient has no known allergies.    Review of Systems   Review of Systems  Gastrointestinal:  Positive for abdominal pain.  All other systems reviewed and are negative.   Physical Exam Updated Vital Signs BP (!) 111/54   Pulse (!) 59   Temp 98.3 F (36.8 C) (Oral)   Resp 16   Ht 5\' 3"  (1.6 m)   Wt 63.1 kg   LMP 07/15/2012   SpO2 99%   BMI 24.64 kg/m   Physical Exam Vitals and nursing note reviewed.  Constitutional:      General: She is not in acute distress.    Appearance: She is well-developed.  HENT:     Head: Normocephalic and atraumatic.  Eyes:     Conjunctiva/sclera: Conjunctivae normal.  Cardiovascular:     Rate and Rhythm: Normal rate and regular rhythm.     Heart sounds: No murmur heard. Pulmonary:     Effort: Pulmonary effort is normal. No respiratory distress.     Breath sounds: Normal breath sounds.  Abdominal:     Palpations: Abdomen is soft.     Tenderness: There is abdominal tenderness in the left lower quadrant. There is left CVA tenderness. Negative signs include Murphy's sign and McBurney's sign.  Musculoskeletal:        General: No swelling.     Cervical back: Neck supple.     Right lower leg: No edema.     Left lower leg: No edema.  Skin:    General: Skin is warm and dry.     Capillary Refill: Capillary refill takes less than 2 seconds.  Neurological:     Mental Status: She is alert.  Psychiatric:        Mood and Affect: Mood normal.     ED Results / Procedures / Treatments   Labs (all labs ordered are listed, but only abnormal results are displayed) Labs  Reviewed  CBC - Abnormal; Notable for the following components:      Result Value   RDW 11.4 (*)    All other components within normal limits  URINALYSIS, ROUTINE W REFLEX MICROSCOPIC - Abnormal; Notable for the following components:   Hgb urine dipstick TRACE (*)    All other components within normal limits  URINALYSIS, MICROSCOPIC (REFLEX) - Abnormal; Notable for the following components:   Bacteria, UA MANY (*)    All other components within normal limits  LIPASE, BLOOD  COMPREHENSIVE METABOLIC PANEL    EKG None  Radiology No results found.  Procedures Procedures    Medications Ordered in ED Medications  dicyclomine (BENTYL) capsule 10 mg (10 mg Oral Given 12/17/22 1546)    ED Course/ Medical Decision Making/ A&P Clinical  Course as of 12/17/22 1754  Fri Dec 17, 2022  1747 Stable  HX of IBS Concern for diverticulitis, follow up CTAP [CC]    Clinical Course User Index [CC] Glyn Ade, MD                             Medical Decision Making Amount and/or Complexity of Data Reviewed Labs: ordered. Radiology: ordered.  Risk Prescription drug management.   This patient presents to the ED for concern of abdominal pain, this involves an extensive number of treatment options, and is a complaint that carries with it a high risk of complications and morbidity.  The differential diagnosis includes pyelonephritis, nephrolithiasis, cystitis, SBO/LBO, diverticulitis, appendicitis, gastritis, PUD, pancreatitis, CBD pathology, cholecystitis, hepatitis   Co morbidities that complicate the patient evaluation  See HPI   Additional history obtained:  Additional history obtained from EMR External records from outside source obtained and reviewed including hospital records   Lab Tests:  I Ordered, and personally interpreted labs.  The pertinent results include: No leukocytosis..  No evidence of anemia.  Placed within range.  No Electra abnormalities.  No renal dysfunction.  No transaminitis.  UA significant for many bacteria, trace hemoglobin otherwise unremarkable.  Lipase within normal limits.   Imaging Studies ordered:  I ordered imaging studies including CT abdomen pelvis pending   Cardiac Monitoring: / EKG:  The patient was maintained on a cardiac monitor.  I personally viewed and interpreted the cardiac monitored which showed an underlying rhythm of: Sinus rhythm   Consultations Obtained:  N/a   Problem List / ED Course / Critical interventions / Medication management  Left lower abdominal pain I ordered medication including bentyl   Reevaluation of the patient after these medicines showed that the patient stayed the same I have reviewed the patients home medicines and have made  adjustments as needed   Social Determinants of Health:  Denies tobacco, illicit drug use   Test / Admission - Considered:  Left lower abdominal pain Vitals signs within normal range and stable throughout visit. Laboratory/imaging studies significant for: see above 56 year old female presents emergency department with left lower abdominal pain.  Laboratory studies overall reassuring but awaiting CT imaging for further assessment of intra-abdominal/intrapelvic process. Disposition pending at this time. At shift change, patient care handed of to Dr. Doran Durand. Patient stable upon shift change        Final Clinical Impression(s) / ED Diagnoses Final diagnoses:  None    Rx / DC Orders ED Discharge Orders     None         Peter Garter, [place] 12/17/22 1754    Glyn Ade,  MD 12/17/22 2057

## 2023-01-01 ENCOUNTER — Other Ambulatory Visit: Payer: Self-pay | Admitting: Gastroenterology

## 2023-01-03 ENCOUNTER — Telehealth: Payer: Self-pay | Admitting: Gastroenterology

## 2023-01-03 MED ORDER — LINZESS 145 MCG PO CAPS: 145.0000 ug | ORAL_CAPSULE | Freq: Every morning | ORAL | 1 refills | Status: DC

## 2023-01-03 MED ORDER — LINZESS 145 MCG PO CAPS: 145.0000 ug | ORAL_CAPSULE | Freq: Every morning | ORAL | 3 refills | Status: DC

## 2023-01-03 NOTE — Telephone Encounter (Signed)
Inbound call from patient husband requesting a new prescription for Linzess .Please advise

## 2023-01-03 NOTE — Telephone Encounter (Signed)
Patient's husband states his wife needs a 90 day supply of Linzess sent to [facility]. Prescription sent to patient's pharmacy.

## 2023-01-14 ENCOUNTER — Ambulatory Visit: Payer: BC Managed Care – PPO | Admitting: Internal Medicine

## 2023-02-09 ENCOUNTER — Encounter: Payer: Self-pay | Admitting: Nurse Practitioner

## 2023-02-09 ENCOUNTER — Encounter (INDEPENDENT_AMBULATORY_CARE_PROVIDER_SITE_OTHER): Payer: Self-pay

## 2023-02-09 ENCOUNTER — Ambulatory Visit (INDEPENDENT_AMBULATORY_CARE_PROVIDER_SITE_OTHER): Payer: BC Managed Care – PPO | Admitting: Nurse Practitioner

## 2023-02-09 VITALS — BP 106/70 | HR 76 | Ht 63.0 in | Wt 137.1 lb

## 2023-02-09 DIAGNOSIS — R140 Abdominal distension (gaseous): Secondary | ICD-10-CM | POA: Diagnosis not present

## 2023-02-09 DIAGNOSIS — K581 Irritable bowel syndrome with constipation: Secondary | ICD-10-CM | POA: Diagnosis not present

## 2023-02-09 DIAGNOSIS — K219 Gastro-esophageal reflux disease without esophagitis: Secondary | ICD-10-CM | POA: Diagnosis not present

## 2023-02-09 MED ORDER — PANTOPRAZOLE SODIUM 40 MG PO TBEC: 40.0000 mg | DELAYED_RELEASE_TABLET | Freq: Every day | ORAL | 3 refills | Status: DC

## 2023-02-09 NOTE — Patient Instructions (Addendum)
_______________________________________________________  If your blood pressure at your visit was 140/90 or greater, please contact your primary care physician to follow up on this.  _______________________________________________________  If you are age 56 or older, your body mass index should be between 23-30. Your Body mass index is 24.29 kg/m. If this is out of the aforementioned range listed, please consider follow up with your Primary Care Provider.  If you are age 59 or younger, your body mass index should be between 19-25. Your Body mass index is 24.29 kg/m. If this is out of the aformentioned range listed, please consider follow up with your Primary Care Provider.   ________________________________________________________  The [facility] providers would like to encourage you to use [facility] to communicate with providers for non-urgent requests or questions.  Due to long hold times on the telephone, sending your provider a message by [facility] may be a faster and more efficient way to get a response.  Please allow 48 business hours for a response.  Please remember that this is for non-urgent requests.  _______________________________________________________  _______________________________________________________  Food Guidelines for those with chronic digestive trouble:  Many people have difficulty digesting certain foods, causing a variety of distressing and embarrassing symptoms such as abdominal pain, bloating and gas.  These foods may need to be avoided or consumed in small amounts.  Here are some tips that might be helpful for you.  1.   Lactose intolerance is the difficulty or complete inability to digest lactose, the natural sugar in milk and anything made from milk.  This condition is harmless, common, and can begin any time during life.  Some people can digest a modest amount of lactose while others cannot tolerate any.  Also, not all dairy products contain equal amounts of  lactose.  For example, hard cheeses such as parmesan have less lactose than soft cheeses such as cheddar.  Yogurt has less lactose than milk or cheese.  Many packaged foods (even many brands of bread) have milk, so read ingredient lists carefully.  It is difficult to test for lactose intolerance, so just try avoiding lactose as much as possible for a week and see what happens with your symptoms.  If you seem to be lactose intolerant, the best plan is to avoid it (but make sure you get calcium from another source).  The next best thing is to use lactase enzyme supplements, available over the counter everywhere.  Just know that many lactose intolerant people need to take several tablets with each serving of dairy to avoid symptoms.  Lastly, a lot of restaurant food is made with milk or butter.  Many are things you might not suspect, such as mashed potatoes, rice and pasta (cooked with butter) and "grilled" items.  If you are lactose intolerant, it never hurts to ask your server what has milk or butter.  2.   Fiber is an important part of your diet, but not all fiber is well-tolerated.  Insoluble fiber such as bran is often consumed by normal gut bacteria and converted into gas.  Soluble fiber such as oats, squash, carrots and green beans are typically tolerated better.  3.   Some types of carbohydrates can be poorly digested.  Examples include: fructose (apples, cherries, pears, raisins and other dried fruits), fructans (onions, zucchini, large amounts of wheat), sorbitol/mannitol/xylitol and sucralose/Splenda (common artificial sweeteners), and raffinose (lentils, broccoli, cabbage, asparagus, brussel sprouts, many types of beans).  Do a Programmer, multimedia for [employer] and you will find helpful information.  Beano, a dietary supplement, will often help with raffinose-containing foods.  As with lactase tablets, you may need several per serving.  4.   Whenever possible, avoid processed food&meats and chemical  additives.  High fructose corn syrup, a common sweetener, may be difficult to digest.  Eggs and soy (comes from the soybean, and added to many foods now) are other common bloating/gassy foods.  5.  Regarding gluten:  gluten is a protein mainly found in wheat, but also rye and barley.  There is a condition called celiac sprue, which is an inflammatory reaction in the small intestine causing a variety of digestive symptoms.  Blood testing is highly reliable to look for this condition, and sometimes upper endoscopy with small bowel biopsies may be necessary to make the diagnosis.  Many patients who test negative for celiac sprue report improvement in their digestive symptoms when they switch to a gluten-free diet.  However, in these "non-celiac gluten sensitive" patients, the true role of gluten in their symptoms is unclear.  Reducing carbohydrates in general may decrease the gas and bloating caused when gut bacteria consume carbs. Also, some of these patients may actually be intolerant of the baker's yeast in bread products rather than the gluten.  Flatbread and other reduced yeast breads might therefore be tolerated.  There is no specific testing available for most food intolerances, which are discovered mainly by dietary elimination.  Please do not embark on a gluten free diet unless directed by your doctor, as it is highly restrictive, and may lead to nutritional deficiencies if not carefully monitored.  Lastly, beware of internet claims offering "personalized" tests for food intolerances.  Such testing has no reliable scientific evidence to support its reliability and correlation to symptoms.    6.  The best advice is old advice, especially for those with chronic digestive trouble - try to eat "clean".  Balanced diet, avoid processed food, plenty of fruits and vegetables, cut down the sugar, minimal alcohol, avoid tobacco. Make time to care for yourself, get enough sleep, exercise when you can, reduce stress.   Your guts will thank you for it.  It was a pleasure to see you today!  Thank you for trusting me with your gastrointestinal care!

## 2023-02-09 NOTE — Progress Notes (Addendum)
Primary GI:  Claudette Head, MD  ASSESSMENT & PLAN   Brief Narrative:  57 y.o.  female whose past medical history includes,  but is not necessarily limited to, IBS-C, GERD, adenomatous colon polyps, fatty liver disease.    Constipation predominant IBS Prior failed therapies include MiraLAX, Dulcolax, and Trulance -Linzess works well but has been cost prohibitive at times.  She has met her insurance deductible for the year which has allowed her to resume Linzess.  She is happy with the results.  -- Continue Linzess 145 mcg daily on an empty stomach  Bloating.  -Dietary information provided, see AVS  GERD, postprandial reflux after running out of pantoprazole prescription a month ago -Resume pantoprazole 40 mg daily before breakfast.  Prescription refilled  History of adenomatous colon polyps.  -- Surveillance colonoscopy due February 2029   HPI   Brief GI history Patient followed by Dr. Russella Dar for constipation predominant IBS and GERD.  Previously treated with Linzess which worked well but was cost prohibitive.  Other previous therapies include MiraLAX and Dulcolax which were either not effective or led to side effects.  At the time of her last visit here 09/27/2022 she was started on Trulance.  For GERD she was continued on pantoprazole 40 mg daily  Interval History    Chief complaint : Here for follow up ( original 2 month follow up was rescheduled) on IBS and GERD  Virgin had no success with Trulance.  No side effects but just did not help her to have a bowel movement.  After meeting her insurance deductible for the year she was able to afford Linzess again.  She is back on Linzess 145 mcg daily and doing well.  She averages about 2 bowel movements a day now.  Karim has been out of pantoprazole prescription for months now she is having some postprandial reflux.  She has been taking Tums to help with symptoms.  She would like a refill on pantoprazole  Patient complains of  frequent abdominal bloating after meals.   Tamila was seen in the ED 12/17/2022 for evaluation of LLQ pain.  Lipase, CMET and CBC unremarkable.  No acute findings on CT scan  CT AP w contrast IMPRESSION: 1. No acute process demonstrated in the abdomen or pelvis. No evidence of bowel obstruction or inflammation. 2. Fatty infiltration of the liver. 3. Small esophageal hiatal hernia.    Previous GI Endoscopies / Labs / Imaging   Feb 2022 EGD -Small hiatal hernia.  Gastritis.  Exam of the stomach otherwise normal.  Duodenal bulb and second portion of duodenum normal.       Latest Ref Rng & Units 12/17/2022    2:05 PM 12/13/2022   10:40 AM 02/24/2022   10:58 AM  Hepatic Function  Total Protein 6.5 - 8.1 g/dL 7.0  6.9  6.6   Albumin 3.5 - 5.0 g/dL 4.1  4.5  4.4   AST 15 - 41 U/L 20  19  12    ALT 0 - 44 U/L 23  27  12    Alk Phosphatase 38 - 126 U/L 78  78  93   Total Bilirubin 0.3 - 1.2 mg/dL 0.7  0.8  1.0        Latest Ref Rng & Units 12/17/2022    2:05 PM 12/13/2022   10:40 AM 02/24/2022   10:58 AM  CBC  WBC 4.0 - 10.5 K/uL 5.6  4.9  5.7   Hemoglobin 12.0 - 15.0 g/dL 16.1  12.6  13.3   Hematocrit 36.0 - 46.0 % 38.8  37.7  40.1   Platelets 150 - 400 K/uL 378  360.0  407.0      Past Medical History:  Diagnosis Date   Birth control    husband's vasectomy   Chronic constipation 06/30/2011   GERD (gastroesophageal reflux disease)    Lumbar radiculopathy     Past Surgical History:  Procedure Laterality Date   BIOPSY TEAR [facility] Right 2012   Right lacrimal gland biopsy, chronic inflammation, no malignancy   BUNIONECTOMY  2000   Right   LIPOMA EXCISION Right 11/22/2019   R arm    Family History  Problem Relation Age of Onset   Hypertension Mother        M   Heart attack Father        59   Hyperlipidemia Neg Hx    Coronary artery disease Neg Hx    Stroke Neg Hx    Colon cancer Neg Hx    Breast cancer Neg Hx    Esophageal cancer Neg Hx    Liver cancer Neg Hx     Pancreatic cancer Neg Hx    Prostate cancer Neg Hx    Rectal cancer Neg Hx    Stomach cancer Neg Hx     Current Medications, Allergies, Family History and Social History were reviewed in [employer] record.     Current Outpatient Medications  Medication Sig Dispense Refill   cholecalciferol (VITAMIN D3) 25 MCG (1000 UT) tablet Take 1,000 Units by mouth daily.     LINZESS 145 MCG CAPS capsule Take 1 capsule (145 mcg total) by mouth every morning. 90 capsule 1   No current facility-administered medications for this visit.    Review of Systems: No chest pain. No shortness of breath. No urinary complaints.    Physical Exam  Wt Readings from Last 3 Encounters:  02/09/23 137 lb 2 oz (62.2 kg)  12/17/22 139 lb 1.8 oz (63.1 kg)  12/13/22 139 lb 3.2 oz (63.1 kg)    BP 106/70   Pulse 76   Ht 5\' 3"  (1.6 m)   Wt 137 lb 2 oz (62.2 kg)   LMP 07/15/2012   SpO2 98%   BMI 24.29 kg/m  Constitutional:  Pleasant, generally well appearing female in no acute distress. Psychiatric: Normal mood and affect. Behavior is normal. EENT: Pupils normal.  Conjunctivae are normal. No scleral icterus. Neck supple.  Cardiovascular: Normal rate, regular rhythm.  Pulmonary/chest: Effort normal and breath sounds normal. No wheezing, rales or rhonchi. Abdominal: Soft, nondistended, nontender. Bowel sounds active throughout. There are no masses palpable. No hepatomegaly. Neurological: Alert and oriented to person place and time.   Willette Cluster, NP  02/09/2023, 9:13 AM  Cc:  Wanda Plump, MD

## 2023-02-12 DIAGNOSIS — R21 Rash and other nonspecific skin eruption: Secondary | ICD-10-CM | POA: Diagnosis not present

## 2023-02-12 DIAGNOSIS — B354 Tinea corporis: Secondary | ICD-10-CM | POA: Diagnosis not present

## 2023-02-28 ENCOUNTER — Ambulatory Visit (INDEPENDENT_AMBULATORY_CARE_PROVIDER_SITE_OTHER): Payer: BC Managed Care – PPO | Admitting: Internal Medicine

## 2023-02-28 ENCOUNTER — Encounter: Payer: Self-pay | Admitting: Internal Medicine

## 2023-02-28 VITALS — BP 122/66 | HR 66 | Temp 98.2°F | Resp 16 | Ht 63.0 in | Wt 139.5 lb

## 2023-02-28 DIAGNOSIS — Z0000 Encounter for general adult medical examination without abnormal findings: Secondary | ICD-10-CM

## 2023-02-28 DIAGNOSIS — M542 Cervicalgia: Secondary | ICD-10-CM | POA: Diagnosis not present

## 2023-02-28 DIAGNOSIS — M8580 Other specified disorders of bone density and structure, unspecified site: Secondary | ICD-10-CM

## 2023-02-28 LAB — LIPID PANEL
Cholesterol: 200 mg/dL (ref 0–200)
HDL: 51.4 mg/dL (ref >39.00)
LDL Cholesterol: 133 mg/dL — ABNORMAL HIGH (ref 0–99)
NonHDL: 148.33
Total CHOL/HDL Ratio: 4
Triglycerides: 79 mg/dL (ref 0.0–149.0)
VLDL: 15.8 mg/dL (ref 0.0–40.0)

## 2023-02-28 LAB — VITAMIN D 25 HYDROXY (VIT D DEFICIENCY, FRACTURES): VITD: 33.85 ng/mL (ref 30.00–100.00)

## 2023-02-28 NOTE — Patient Instructions (Signed)
Recommend to see a counselor for stress management.  Will refer you to a sports medicine doctor for neck pain.  GO TO THE LAB : Get the blood work     GO TO THE FRONT DESK, PLEASE SCHEDULE YOUR APPOINTMENTS Come back for   a checkup in 6 months.

## 2023-02-28 NOTE — Assessment & Plan Note (Signed)
Here for CPX -Td 2022 - s/p Shingrix x2 -Vaccines I recommend: Flu shot every fall, COVID booster if not done recently -CCS:   cscope, 09/2017,Cscope 08/2020, next per GI -Female care per gyn , MMG March 2024 -Very active at work but no routine exercise d/t lack of time. -Labs: Reviewed, will get FLP and vitamin D

## 2023-02-28 NOTE — Progress Notes (Signed)
Subjective:    Patient ID: Karen Case, female    DOB: 05-Jul-1966, 57 y.o.   MRN: [id number]  DOS:  02/28/2023 Type of visit - description: cpx  Here for CPX Continue with GI symptoms, mostly early satiety even with very small amounts of food. No weight loss.  No nausea vomiting.  No blood in the stools.  Also continue with the stress, work-related, occasional feels slightly down.  Also reports neck pain, steady, posterior neck, bilateral, no fever or chills.  No recent falls.   "I think  is stress".  Wt Readings from Last 3 Encounters:  02/28/23 139 lb 8 oz (63.3 kg)  02/09/23 137 lb 2 oz (62.2 kg)  12/17/22 139 lb 1.8 oz (63.1 kg)     Review of Systems  Other than above, a 14 point review of systems is negative     Past Medical History:  Diagnosis Date   Birth control    husband's vasectomy   Chronic constipation 06/30/2011   GERD (gastroesophageal reflux disease)    Lumbar radiculopathy     Past Surgical History:  Procedure Laterality Date   BIOPSY TEAR [facility] Right 2012   Right lacrimal gland biopsy, chronic inflammation, no malignancy   BUNIONECTOMY  2000   Right   LIPOMA EXCISION Right 11/22/2019   R arm   Social History   Socioeconomic History   Marital status: Married    Spouse name: Roger   Number of children: 2   Years of education: Not on file   Highest education level: Some college, no degree  Occupational History   Occupation: full time job (school supplies)    Comment: walks several hours qd  Tobacco Use   Smoking status: Never   Smokeless tobacco: Never  Vaping Use   Vaping status: Never Used  Substance and Sexual Activity   Alcohol use: Yes    Alcohol/week: 0.0 standard drinks of alcohol    Comment: socially    Drug use: No   Sexual activity: Not on file  Other Topics Concern   Not on file  Social History Narrative   Original from [place], moved to [place] in 1994    2 daughters   Shaune Pascal graduated from [employer], lives in  [place] [employer]       Patient is right-handed. She lives with her husband in a 2 level home. She drinks one large mug of coffee a day and sometimes tea. She walks her dog, and before COVID restrictions    Social Determinants of Health   Financial Resource Strain: Not on file  Food Insecurity: Not on file  Transportation Needs: Not on file  Physical Activity: Not on file  Stress: Not on file  Social Connections: Unknown (11/23/2021)   Received from [facility]   Social Network    Social Network: Not on file  Intimate Partner Violence: Unknown (10/15/2021)   Received from [facility]   HITS    Physically Hurt: Not on file    Insult or Talk Down To: Not on file    Threaten Physical Harm: Not on file    Scream or Curse: Not on file     Current Outpatient Medications  Medication Instructions   cholecalciferol (VITAMIN D3) 1,000 Units, Oral, Daily   Linzess 145 mcg, Oral, Every morning   pantoprazole (PROTONIX) 40 mg, Oral, Daily       Objective:   Physical Exam BP 122/66   Pulse 66   Temp  98.2 F (36.8 C) (Oral)   Resp 16   Ht 5\' 3"  (1.6 m)   Wt 139 lb 8 oz (63.3 kg)   LMP 07/15/2012   SpO2 97%   BMI 24.71 kg/m  General: Well developed, NAD, BMI noted Neck: No  thyromegaly.  Range of motion normal  HEENT:  Normocephalic . Face symmetric, atraumatic Lungs:  CTA B Normal respiratory effort, no intercostal retractions, no accessory muscle use. Heart: RRR,  no murmur.  Abdomen:  Not distended, soft, non-tender. No rebound or rigidity.   Lower extremities: no pretibial edema bilaterally  Skin: Exposed areas without rash. Not pale. Not jaundice Neurologic:  alert & oriented X3.  Speech normal, gait appropriate for age and unassisted Strength symmetric and appropriate for age.  Psych: Cognition and judgment appear intact.  Cooperative with normal attention span and concentration.  Behavior appropriate. No anxious or depressed appearing.      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 -Labs: Reviewed, will get FLP and vitamin D IBS-C: Still has early satiety, no weight loss, recommend to continue working with GI. Osteopenia.  T-score -2.2 (August 2023), Rx exercise, vitamin D, recheck 2026 Stress: Work-related, strongly recommend to see a counselor, medications are an option, she will let me know when ready. Neck pain: She thinks is stress related, that is possible, no fever chills or recent injury.  Occasionally left arm paresthesias.  Referred to sports medicine. RTC 6 months

## 2023-02-28 NOTE — Assessment & Plan Note (Signed)
Here for CPX IBS-C: Still has early satiety, no weight loss, recommend to continue working with GI. Osteopenia.  T-score -2.2 (August 2023), Rx exercise, vitamin D, recheck 2026 Stress: Work-related, strongly recommend to see a counselor, medications are an option, she will let me know when ready. Neck pain: She thinks is stress related, that is possible, no fever chills or recent injury.  Occasionally left arm paresthesias.  Referred to sports medicine. RTC 6 months

## 2023-03-10 DIAGNOSIS — M1712 Unilateral primary osteoarthritis, left knee: Secondary | ICD-10-CM | POA: Diagnosis not present

## 2023-03-10 NOTE — Progress Notes (Signed)
    Karen Case [facility] [address] [place] [address] Phone: [phone]   Assessment and Plan:     1. Neck pain 2. Strain of left trapezius muscle, initial encounter 3. Trapezius strain, right, initial encounter  -Acute, uncomplicated, initial sports medicine visit - Most consistent with strain of bilateral trapezius and cervical paraspinal muscles due to physical activity, stress, and leading to tension headaches - Recommend starting HEP - Start Celebrex 200 mg twice daily x2 weeks.  If still having pain after 2 weeks, complete 3rd-week of Celebrex. May use remaining Celebrex as needed once daily for pain control.  Do not to use additional NSAIDs while taking Celebrex.  May use Tylenol [phone] mg 2 to 3 times a day for breakthrough pain.  Pertinent previous records reviewed include family medicine note 02/28/2023   Follow Up: 4 weeks for reevaluation.  If no improvement or worsening of symptoms, could obtain C-spine x-ray, consider physical therapy versus discussing OMT   Subjective:   I, Karen Case, am serving as a Neurosurgeon for Doctor Richardean Sale  Chief Complaint: neck pain   HPI:   03/11/2023 Patient is a 57 year old female complaining of neck pain. Patient states Saw PCP for this reason on 02/28/23 and thinks the pain is stress related no MOI, and sometimes has numbness and tingling in the left arm.  Patient states the neck pain has been going on for less than a month. Tried tylenol, motrin, Advil, massages ice and heat. Patien locates pain to posterior left side of neck and radiates up the base of the head. 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   Relevant Historical Information: None pertinent  Additional pertinent review of systems negative.   Current Outpatient Medications:    celecoxib (CELEBREX) 200 MG capsule, Take 1 capsule (200 mg total) by mouth 2 (two) times daily., Disp: 90 capsule, Rfl: 0   cholecalciferol  (VITAMIN D3) 25 MCG (1000 UT) tablet, Take 1,000 Units by mouth daily., Disp: , Rfl:    LINZESS 145 MCG CAPS capsule, Take 1 capsule (145 mcg total) by mouth every morning., Disp: 90 capsule, Rfl: 1   pantoprazole (PROTONIX) 40 MG tablet, Take 1 tablet (40 mg total) by mouth daily., Disp: 90 tablet, Rfl: 3   Objective:     Vitals:   03/11/23 1347  BP: 110/70  Pulse: 75  SpO2: 98%  Weight: 139 lb (63 kg)  Height: 5\' 3"  (1.6 m)      Body mass index is 24.62 kg/m.    Physical Exam:    Neck Exam: Cervical Spine- Posture normal Skin- normal, intact  Neuro:  Strength-  Right Left   Deltoid (C5) 5/5 5/5  Bicep/Brachioradialis (C5/6) 5/5  5/5  Wrist Extension (C6) 5/5 5/5  Tricep (C7) 5/5 5/5  Wrist Flexion (C7) 5/5 5/5  Grip (C8) 5/5 5/5  Finger Abduction (T1) 5/5 5/5   Sensation: intact to light touch in upper extremities bilaterally  Spurling's:  negative bilaterally Neck ROM: Full active ROM TTP: Bilateral cervical paraspinal, trapezius, thoracic paraspinal.  Mild to cervical spinous processes    Electronically signed by:  Karen Case [facility] 2:13 PM 03/11/23

## 2023-03-11 ENCOUNTER — Ambulatory Visit (INDEPENDENT_AMBULATORY_CARE_PROVIDER_SITE_OTHER): Payer: BC Managed Care – PPO | Admitting: Sports Medicine

## 2023-03-11 ENCOUNTER — Other Ambulatory Visit: Payer: Self-pay | Admitting: Sports Medicine

## 2023-03-11 VITALS — BP 110/70 | HR 75 | Ht 63.0 in | Wt 139.0 lb

## 2023-03-11 DIAGNOSIS — M542 Cervicalgia: Secondary | ICD-10-CM

## 2023-03-11 DIAGNOSIS — S46812A Strain of other muscles, fascia and tendons at shoulder and upper arm level, left arm, initial encounter: Secondary | ICD-10-CM

## 2023-03-11 DIAGNOSIS — S46811A Strain of other muscles, fascia and tendons at shoulder and upper arm level, right arm, initial encounter: Secondary | ICD-10-CM

## 2023-03-11 MED ORDER — CELECOXIB 200 MG PO CAPS: 200.0000 mg | ORAL_CAPSULE | Freq: Two times a day (BID) | ORAL | 0 refills | Status: DC

## 2023-03-11 NOTE — Patient Instructions (Addendum)
Good to see you  - Start Celebrex 200mg  mg BID x2 weeks.  If still having pain after 2 weeks, complete 3rd-week of Celebrex. May use remaining Celebrex as needed BID for pain control.  Do not to use additional NSAIDs while taking meloxicam.  May use Tylenol [phone] mg 2 to 3 times a day for breakthrough pain. Trapezius and neck exercises given Follow up in 4 weeks    Trapezius Palsy Rehab Ask your health care provider which exercises are safe for you. Do exercises exactly as told by your health care provider and adjust them as directed. It is normal to feel mild stretching, pulling, tightness, or discomfort as you do these exercises. Stop right away if you feel sudden pain or your pain gets worse. Do not begin these exercises until told by your health care provider. Stretching and range-of-motion exercises These exercises warm up your muscles and joints and improve the movement and flexibility of your shoulder. They can also help relieve pain, numbness, and tingling. Shoulder abduction, active-assisted You will need a stick, broom handle, or similar object to help you do this exercise. Lie on your back. This is the supine position. Hold a broomstick, a cane, or a similar object. Place your hands a little more than shoulder width apart on the object. Your left / right hand should be palm-up, and your other hand should be palm-down. Keeping your shoulder relaxed, push the stick to raise your left / right arm out to your side (abduction) and then over your head. Use your other hand to help move the stick. Stop when you feel a stretch in your shoulder, or when you reach the angle that is recommended by your health care provider. Avoid shrugging your shoulder while you raise your arm. Keep your shoulder blade tucked down toward the middle of your back. Hold for __________ seconds. Slowly return to the starting position. Repeat __________ times. Complete this exercise __________ times a day. Shoulder  flexion, active-assisted  Lie on your back. You may bend your knees for comfort. Hold a broomstick, a cane, or a similar object so your hands are about shoulder width apart. Your palms should face toward your feet. Raise your left / right arm over your head, then behind your head toward the floor (flexion). Use your other hand to help you do this (active-assisted). Stop when you feel a gentle stretch in your shoulder, or when you reach the angle that is recommended by your health care provider. Hold for __________ seconds. Use the stick and your other arm to help you return your left / right arm to the starting position. Repeat __________ times. Complete this exercise __________ times a day. External rotation and abduction This exercise is sometimes called corner stretch. This exercise rotates your arm outward (external rotation) and moves your arm out from your body (abduction). Stand in a door frame with one foot slightly in front of the other. This is called a staggered stance. If you cannot reach your forearms to the door frame, stand facing a corner of a room. Choose one of the following positions as told by your health care provider: Place your hands and forearms on the door frame above your head. Place your hands and forearms on the door frame at the height of your head. Place your hands on the door frame at the height of your elbows. Slowly move your weight onto your front foot until you feel a stretch across your chest and in the front of your  shoulders. Keep your head and chest upright and keep your abdominal muscles tight. Hold for __________ seconds. To release the stretch, shift your weight to your back foot. Repeat __________ times. Complete this exercise __________ times a day. Strengthening exercises These exercises build strength and endurance in your shoulder. Endurance is the ability to use your muscles for a long time, even after they get tired. Scapular depression and  adduction  In this exercise, you push your shoulder blades down (depression) while pulling your arms toward the center of your spine (adduction). After you have practiced this exercise, try doing it without the arm support. Then, try the exercise while standing instead of sitting. Sit on a stable chair. Support your arms in front of you with pillows, armrests, or a tabletop. Keep your elbows in line with the sides of your body (adduction). Gently move your shoulder blades (scapulae) down toward your middle back. Relax the muscles on the tops of your shoulders and in the back of your neck. Hold for __________ seconds. Slowly release the tension and relax your muscles completely before you repeat the exercise. Repeat __________ times. Complete this exercise __________ times a day. Shoulder flexion, isometric  Stand or sit in a doorway, facing the door frame. Keep your left / right arm straight and make a gentle fist with your hand. Place your fist against the door frame. Only your fist should be touching the frame. Keep your upper arm at your side. Gently press your fist against the door frame, as if you are trying to raise your arm above your head (isometric shoulder flexion). Avoid shrugging your shoulder while you press your hand into the door frame. Keep your shoulder blade tucked down toward the middle of your back. Hold for __________ seconds. Slowly release the tension, and relax your muscles completely before you repeat the exercise. Repeat __________ times. Complete this exercise __________ times a day. Internal rotation, isotonic This is an exercise in which you use an exercise band and gently rotate your forearm toward your body (internal rotation) while moving your shoulder joint (isotonic). Sit in a stable chair without armrests or stand up. At elbow height on your left / right side, secure an exercise band to a stable object. Place a soft object, such as a folded towel or a small  pillow, between your left / right upper arm and your body so your elbow is about 4 inches (10 cm) away from your side. Grasp the end of the exercise band in your left / right hand so it stretches with slight tension. Keeping your elbow pressed against the soft object, slowly move your forearm in, toward your abdomen. Keep your body steady so the movement is only coming from your shoulder. Hold for __________ seconds. Slowly return to the starting position. Repeat __________ times. Complete this exercise __________ times a day. External rotation, isotonic This is an exercise in which you use an exercise band and gently rotate your forearm away from your body (external rotation) while moving your shoulder joint (isotonic). Sit in a stable chair without armrests or stand up. At elbow height on your left / right side, secure an exercise band to a stable object. Place a soft object, such as a folded towel or a small pillow, between your left / right upper arm and your body so your elbow is about 4 inches (10 cm) away from your side. Grasp the end of the exercise band in your left / right hand so it stretches with  slight tension. Keeping your elbow pressed against the soft object, slowly move your forearm out, away from your abdomen. Keep your body steady so the movement is only coming from your shoulder. Hold for __________ seconds. Slowly return to the starting position. Repeat __________ times. Complete this exercise __________ times a day. Shoulder extension  Sit in a stable chair without armrests or stand up. Secure an exercise band to a stable object in front of you so the band is at shoulder height. Hold one end of the exercise band in each hand. Straighten your elbows and lift your hands up to shoulder height. Step back, away from the secured end of the exercise band, until the band stretches. Squeeze your shoulder blades together as you pull your hands down to the sides of your thighs  (extension). Stop when your hands are straight down by your sides. Do not let your hands go behind your body. Hold for __________ seconds. Slowly return to the starting position. Repeat __________ times. Complete this exercise __________ times a day. Prone horizontal abduction with external rotation  Lie on your abdomen (prone position) on a firm surface so your left / right arm hangs over the edge. Hold a __________ lb (kg) weight in your hand so your palm faces toward your feet. Your arm should be straight. Squeeze your shoulder blade down toward the middle of your back. Bend your elbow so your hand moves up, until your elbow is bent to a 90-degree angle (right angle). With your elbow bent, slowly move your forearm forward and up (horizontal abduction). Raise your hand up to the height of the surface that you are lying on. Your upper arm should not move, and your elbow should stay bent. At the top of the movement, your palm should face the floor. Hold for __________ seconds. Slowly return to the starting position and relax your muscles. Repeat __________ times. Complete this exercise __________ times a day. Standing horizontal abduction  Sit on a stable chair or stand up. Secure an exercise band to a stable object in front of you so the band is at shoulder height. Hold one end of the exercise band in each hand. Straighten your elbows and lift your hands straight in front of you, up to shoulder height. Your palms should face down, toward the floor. Step back, away from the secured end of the exercise band, until the band stretches. Move your arms out to your sides (horizontal abduction), and keep your arms straight. Hold for __________ seconds. Slowly return to the starting position. Repeat __________ times. Complete this exercise __________ times a day. Scapular retraction and elevation  Sit on a stable chair without armrests or stand up. Secure an exercise band to a stable object in  front of you so the band is at shoulder height. Hold one end of the exercise band in each hand. Your palms should face each other. Step back, away from the secured end of the exercise band, until the band stretches. Squeeze your shoulder blades together (retraction) and lift your hands over your head (elevation). Keep your elbows straight. Hold for __________ seconds. Slowly return to the starting position. Repeat __________ times. Complete this exercise __________ times a day. This information is not intended to replace advice given to you by your health care provider. Make sure you discuss any questions you have with your health care provider. Document Revised: 02/01/2022 Document Reviewed: 02/01/2022 Elsevier Patient Education  2024 [employer]    Neck Exercises Ask your health  care provider which exercises are safe for you. Do exercises exactly as told by your health care provider and adjust them as directed. It is normal to feel mild stretching, pulling, tightness, or discomfort as you do these exercises. Stop right away if you feel sudden pain or your pain gets worse. Do not begin these exercises until told by your health care provider. Neck exercises can be important for many reasons. They can improve strength and maintain flexibility in your neck, which will help your upper back and prevent neck pain. Stretching exercises Rotation neck stretching  Sit in a chair or stand up. Place your feet flat on the floor, shoulder-width apart. Slowly turn your head (rotate) to the right until a slight stretch is felt. Turn it all the way to the right so you can look over your right shoulder. Do not tilt or tip your head. Hold this position for 10-30 seconds. Slowly turn your head (rotate) to the left until a slight stretch is felt. Turn it all the way to the left so you can look over your left shoulder. Do not tilt or tip your head. Hold this position for 10-30 seconds. Repeat __________  times. Complete this exercise __________ times a day. Neck retraction  Sit in a sturdy chair or stand up. Look straight ahead. Do not bend your neck. Use your fingers to push your chin backward (retraction). Do not bend your neck for this movement. Continue to face straight ahead. If you are doing the exercise properly, you will feel a slight sensation in your throat and a stretch at the back of your neck. Hold the stretch for 1-2 seconds. Repeat __________ times. Complete this exercise __________ times a day. Strengthening exercises Neck press  Lie on your back on a firm bed or on the floor with a pillow under your head. Use your neck muscles to push your head down on the pillow and straighten your spine. Hold the position as well as you can. Keep your head facing up (in a neutral position) and your chin tucked. Slowly count to 5 while holding this position. Repeat __________ times. Complete this exercise __________ times a day. Isometrics These are exercises in which you strengthen the muscles in your neck while keeping your neck still (isometrics). Sit in a supportive chair and place your hand on your forehead. Keep your head and face facing straight ahead. Do not flex or extend your neck while doing isometrics. Push forward with your head and neck while pushing back with your hand. Hold for 10 seconds. Do the sequence again, this time putting your hand against the back of your head. Use your head and neck to push backward against the hand pressure. Finally, do the same exercise on either side of your head, pushing sideways against the pressure of your hand. Repeat __________ times. Complete this exercise __________ times a day. Prone head lifts  Lie face-down (prone position), resting on your elbows so that your chest and upper back are raised. Start with your head facing downward, near your chest. Position your chin either on or near your chest. Slowly lift your head upward. Lift  until you are looking straight ahead. Then continue lifting your head as far back as you can comfortably stretch. Hold your head up for 5 seconds. Then slowly lower it to your starting position. Repeat __________ times. Complete this exercise __________ times a day. Supine head lifts  Lie on your back (supine position), bending your knees to point to the  ceiling and keeping your feet flat on the floor. Lift your head slowly off the floor, raising your chin toward your chest. Hold for 5 seconds. Repeat __________ times. Complete this exercise __________ times a day. Scapular retraction  Stand with your arms at your sides. Look straight ahead. Slowly pull both shoulders (scapulae) backward and downward (retraction) until you feel a stretch between your shoulder blades in your upper back. Hold for 10-30 seconds. Relax and repeat. Repeat __________ times. Complete this exercise __________ times a day. Contact a health care provider if: Your neck pain or discomfort gets worse when you do an exercise. Your neck pain or discomfort does not improve within 2 hours after you exercise. If you have any of these problems, stop exercising right away. Do not do the exercises again unless your health care provider says that you can. Get help right away if: You develop sudden, severe neck pain. If this happens, stop exercising right away. Do not do the exercises again unless your health care provider says that you can. This information is not intended to replace advice given to you by your health care provider. Make sure you discuss any questions you have with your health care provider. Document Revised: 12/23/2020 Document Reviewed: 12/23/2020 Elsevier Patient Education  2024 [employer].

## 2023-04-04 DIAGNOSIS — M5416 Radiculopathy, lumbar region: Secondary | ICD-10-CM | POA: Diagnosis not present

## 2023-04-07 NOTE — Progress Notes (Signed)
    Karen Case D.[facility] [address] [place] [address] Phone: [phone]   Assessment and Plan:     1. Neck pain 2. Strain of left trapezius muscle, initial encounter 3. Trapezius strain, right, initial encounter  -Acute, uncomplicated, subsequent sports medicine visit - Significant improvement with resolution of symptoms after completing 1 week course of Celebrex 200 mg twice daily and continuing HEP - Recommend continuing intermittent HEP to prevent recurrence of pain - May discontinue regular Celebrex and instead use medication as needed for breakthrough pain  Pertinent previous records reviewed include none   Follow Up: As needed.  Could consider repeat Celebrex course, HEP, physical therapy   Subjective:   I, Karen Case, am serving as a Neurosurgeon for Doctor Richardean Sale   Chief Complaint: neck pain    HPI:    03/11/2023 Patient is a 57 year old female complaining of neck pain. Patient states Saw PCP for this reason on 02/28/23 and thinks the pain is stress related no MOI, and sometimes has numbness and tingling in the left arm.  Patient states the neck pain has been going on for less than a month. Tried tylenol, motrin, Advil, massages ice and heat. Patien locates pain to posterior left side of neck and radiates up the base of the head. 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   04/08/2023 Patient states that her neck is better , only uses Celebrex when she needs it     Relevant Historical Information: None pertinent  Additional pertinent review of systems negative.   Current Outpatient Medications:    celecoxib (CELEBREX) 200 MG capsule, Take 1 capsule (200 mg total) by mouth 2 (two) times daily., Disp: 90 capsule, Rfl: 0   cholecalciferol (VITAMIN D3) 25 MCG (1000 UT) tablet, Take 1,000 Units by mouth daily., Disp: , Rfl:    LINZESS 145 MCG CAPS capsule, Take 1 capsule (145 mcg total) by mouth every morning., Disp: 90  capsule, Rfl: 1   pantoprazole (PROTONIX) 40 MG tablet, Take 1 tablet (40 mg total) by mouth daily., Disp: 90 tablet, Rfl: 3   Objective:     Vitals:   04/08/23 1350  BP: 108/78  Pulse: 67  SpO2: 98%  Weight: 130 lb (59 kg)  Height: 5\' 3"  (1.6 m)      Body mass index is 23.03 kg/m.    Physical Exam:    Neck Exam: Cervical Spine- Posture normal Skin- normal, intact  Neuro:  Strength-  Right Left   Deltoid (C5) 5/5 5/5  Bicep/Brachioradialis (C5/6) 5/5  5/5  Wrist Extension (C6) 5/5 5/5  Tricep (C7) 5/5 5/5  Wrist Flexion (C7) 5/5 5/5  Grip (C8) 5/5 5/5  Finger Abduction (T1) 5/5 5/5   Sensation: intact to light touch in upper extremities bilaterally  Spurling's:  negative bilaterally Neck ROM: Full active ROM   NTTP: cervical spinous processes, cervical paraspinal, thoracic paraspinal, trapezius    Electronically signed by:  Karen Case D.[facility] 2:20 PM 04/08/23

## 2023-04-08 ENCOUNTER — Ambulatory Visit (INDEPENDENT_AMBULATORY_CARE_PROVIDER_SITE_OTHER): Payer: BC Managed Care – PPO | Admitting: Sports Medicine

## 2023-04-08 VITALS — BP 108/78 | HR 67 | Ht 63.0 in | Wt 130.0 lb

## 2023-04-08 DIAGNOSIS — M542 Cervicalgia: Secondary | ICD-10-CM

## 2023-04-08 DIAGNOSIS — S46811A Strain of other muscles, fascia and tendons at shoulder and upper arm level, right arm, initial encounter: Secondary | ICD-10-CM | POA: Diagnosis not present

## 2023-04-08 DIAGNOSIS — S46812A Strain of other muscles, fascia and tendons at shoulder and upper arm level, left arm, initial encounter: Secondary | ICD-10-CM

## 2023-04-08 NOTE — Patient Instructions (Signed)
Thank you for the [employer] recommendations

## 2023-04-11 DIAGNOSIS — M5416 Radiculopathy, lumbar region: Secondary | ICD-10-CM | POA: Diagnosis not present

## 2023-04-13 ENCOUNTER — Encounter: Payer: Self-pay | Admitting: Gastroenterology

## 2023-04-13 ENCOUNTER — Ambulatory Visit (INDEPENDENT_AMBULATORY_CARE_PROVIDER_SITE_OTHER): Payer: BC Managed Care – PPO | Admitting: Gastroenterology

## 2023-04-13 VITALS — BP 92/58 | HR 76 | Ht 62.5 in | Wt 135.1 lb

## 2023-04-13 DIAGNOSIS — Z860101 Personal history of adenomatous and serrated colon polyps: Secondary | ICD-10-CM | POA: Diagnosis not present

## 2023-04-13 DIAGNOSIS — K581 Irritable bowel syndrome with constipation: Secondary | ICD-10-CM

## 2023-04-13 DIAGNOSIS — K219 Gastro-esophageal reflux disease without esophagitis: Secondary | ICD-10-CM

## 2023-04-13 NOTE — Progress Notes (Signed)
Assessment     IBS-C, symptoms partially controlled  2.   GERD, small hiatal hernia, symptoms well controlled   3.   H. pylori gastritis treated, resolved in 2022  4.   History of adenomatous colon polyps    5.   Hepatic steatosis   Recommendations    Increase Linzess to 290 mcg every day. Contact us in 1-2 weeks to report progress. Continue pantoprazole 40 mg daily qam, follow antireflux measures, TUMS prn Surveillance colonoscopy due February 2029 Carb modified, fat modified diet   HPI    This is a 57 year old female with IBS-C and GERD.  Her IBS-C is improved on Linzess 145 mg every day however her constipation and left lower quadrant pain persist.  She notes dark brown small pellet-like stools. Prior failed therapies include MiraLAX, Dulcolax, and Trulance.  Reflux symptoms are under good control on daily pantoprazole with occasional mild breakthrough symptoms.  EGD Feb 2022 - Normal esophagus.  - Small hiatal hernia.  - Erythematous mucosa in the gastric fundus and gastric body. Biopsied.  - Normal duodenal bulb and second portion of the duodenum.   Labs / Imaging       Latest Ref Rng & Units 12/17/2022    2:05 PM 12/13/2022   10:40 AM 02/24/2022   10:58 AM  Hepatic Function  Total Protein 6.5 - 8.1 g/dL 7.0  6.9  6.6   Albumin 3.5 - 5.0 g/dL 4.1  4.5  4.4   AST 15 - 41 U/L 20  19  12    ALT 0 - 44 U/L 23  27  12    Alk Phosphatase 38 - 126 U/L 78  78  93   Total Bilirubin 0.3 - 1.2 mg/dL 0.7  0.8  1.0        Latest Ref Rng & Units 12/17/2022    2:05 PM 12/13/2022   10:40 AM 02/24/2022   10:58 AM  CBC  WBC 4.0 - 10.5 K/uL 5.6  4.9  5.7   Hemoglobin 12.0 - 15.0 g/dL 40.9  81.1  91.4   Hematocrit 36.0 - 46.0 % 38.8  37.7  40.1   Platelets 150 - 400 K/uL 378  360.0  407.0      CT ABDOMEN PELVIS W CONTRAST CLINICAL DATA:  Left lower quadrant abdominal pain. Pain for 2 weeks. Nausea and vomiting yesterday.  EXAM: CT ABDOMEN AND PELVIS WITH  CONTRAST  TECHNIQUE: Multidetector CT imaging of the abdomen and pelvis was performed using the standard protocol following bolus administration of intravenous contrast.  RADIATION DOSE REDUCTION: This exam was performed according to the departmental dose-optimization program which includes automated exposure control, adjustment of the mA and/or kV according to patient size and/or use of iterative reconstruction technique.  CONTRAST:  OMNIPAQUE IOHEXOL 300 MG/ML  SOLN  COMPARISON:  06/02/2021  FINDINGS: Lower chest: Mild dependent changes in the lung bases. Small esophageal hiatal hernia.  Hepatobiliary: Diffuse fatty infiltration of the liver. Gallbladder and bile ducts are normal.  Pancreas: Unremarkable. No pancreatic ductal dilatation or surrounding inflammatory changes.  Spleen: Normal in size without focal abnormality.  Adrenals/Urinary Tract: Adrenal glands are unremarkable. Kidneys are normal, without renal calculi, focal lesion, or hydronephrosis. Bladder is unremarkable.  Stomach/Bowel: Stomach, small bowel, and colon are not abnormally distended. No wall thickening or inflammatory changes. Appendix is normal.  Vascular/Lymphatic: No significant vascular findings are present. No enlarged abdominal or pelvic lymph nodes.  Reproductive: Uterus and bilateral adnexa are unremarkable.  Other: No abdominal wall hernia or abnormality. No abdominopelvic ascites.  Musculoskeletal: No acute or significant osseous findings.  IMPRESSION: 1. No acute process demonstrated in the abdomen or pelvis. No evidence of bowel obstruction or inflammation. 2. Fatty infiltration of the liver. 3. Small esophageal hiatal hernia.  Electronically Signed   By: Burman Nieves M.D.   On: 12/17/2022 20:17   Current Medications, Allergies, Past Medical History, Past Surgical History, Family History and Social History were reviewed in [employer]  record.   Physical Exam: General: Well developed, well nourished, no acute distress Head: Normocephalic and atraumatic Eyes: Sclerae anicteric, EOMI Ears: Normal auditory acuity Mouth: No deformities or lesions noted Lungs: Clear throughout to auscultation Heart: Regular rate and rhythm; No murmurs, rubs or bruits Abdomen: Soft, non tender and non distended. No masses, hepatosplenomegaly or hernias noted. Normal Bowel sounds Rectal: Not done Musculoskeletal: Symmetrical with no gross deformities  Pulses:  Normal pulses noted Extremities: No edema or deformities noted Neurological: Alert oriented x 4, grossly nonfocal Psychological:  Alert and cooperative. Normal mood and affect   Mattye Verdone T. Russella Dar, MD 04/13/2023, 1:59 PM

## 2023-04-13 NOTE — Patient Instructions (Signed)
Increase your Linzess to 2 capsules by mouth daily to equal 290 mcg.   MyChart message [place] in 7 days to let us know if this helps your symptoms.   The [facility] providers would like to encourage you to use [facility] to communicate with providers for non-urgent requests or questions.  Due to long hold times on the telephone, sending your provider a message by [facility] may be a faster and more efficient way to get a response.  Please allow 48 business hours for a response.  Please remember that this is for non-urgent requests.   Thank you for choosing me and [facility].  Venita Lick. Pleas Koch., MD., Clementeen Graham

## 2023-04-21 ENCOUNTER — Telehealth: Payer: Self-pay | Admitting: Gastroenterology

## 2023-04-21 MED ORDER — LINACLOTIDE 290 MCG PO CAPS: 290.0000 ug | ORAL_CAPSULE | Freq: Every day | ORAL | 1 refills | Status: DC

## 2023-04-21 NOTE — Telephone Encounter (Signed)
Inbound call from patient spouse requesting medication refill on a 3 month supply of linzess.   Verifying pharmacy:[facility] in [place].   Requesting a f/u call. Please advise.

## 2023-04-21 NOTE — Telephone Encounter (Signed)
Agree Linzess 290 mcg qd, 1 year of refills

## 2023-04-21 NOTE — Telephone Encounter (Signed)
Patient's husband states since his wife has increased to Linzess 2 capsules of 145 mcg (to equal 290 mcg) daily, her constipation has really improved. Patient would like a prescription sent to her pharmacy for Linzess 290 mcg daily. Patient's husband requested a 90 day supply. Prescription sent to the pharmacy. FYI Dr. Russella Dar.

## 2023-05-18 DIAGNOSIS — M5416 Radiculopathy, lumbar region: Secondary | ICD-10-CM | POA: Diagnosis not present

## 2023-05-27 DIAGNOSIS — M5416 Radiculopathy, lumbar region: Secondary | ICD-10-CM | POA: Diagnosis not present

## 2023-06-02 NOTE — Progress Notes (Signed)
Karen Case [facility] [address] [place] [address] Phone: [phone]   Assessment and Plan:     1. Acute pain of right shoulder 2. Subacromial bursitis of right shoulder joint 3. Right wrist pain  -Acute, uncomplicated, initial sports medicine visit - Most consistent with subacromial bursitis versus rotator cuff strain based on HPI and physical exam.  I believe compensation is likely leading to right wrist pain - Patient has been using leftover Celebrex 200 mg twice daily without relief.  Recommend discontinuing Celebrex 200 mg twice daily - Patient elected for subacromial CSI.  Tolerated well per note below - Start HEP for rotator cuff and rest - Recommend no lifting >15 pounds with right upper extremity.  Work note provided for 3 weeks or until reevaluated  15 additional minutes spent for educating Therapeutic Home Exercise Program.  This included exercises focusing on stretching, strengthening, with focus on eccentric aspects.   Long term goals include an improvement in range of motion, strength, endurance as well as avoiding reinjury. Patient's frequency would include in 1-2 times a day, 3-5 times a week for a duration of 6-12 weeks. Proper technique shown and discussed handout in great detail with ATC.  All questions were discussed and answered.  Procedure: Subacromial Injection Side: Right  Risks explained and consent was given verbally. The site was cleaned with alcohol prep. A steroid injection was performed from posterior approach using 2mL of 1% lidocaine without epinephrine and 1mL of kenalog 40mg /ml. This was well tolerated and resulted in symptomatic relief.  Needle was removed, hemostasis achieved, and post injection instructions were explained.   Pt was advised to call or return to clinic if these symptoms worsen or fail to improve as anticipated.  Pertinent previous records reviewed include none  Follow Up: 3 to 4 weeks  for reevaluation.  If no improvement or worsening of symptoms, could obtain right shoulder versus right wrist x-ray.  Could consider alternative anti-inflammatory course versus physical therapy   Subjective:   I, Karen Case, am serving as a Neurosurgeon for Karen Case   Chief Complaint: neck pain    HPI:    03/11/2023 Patient is a 57 year old female complaining of neck pain. Patient states Saw PCP for this reason on 02/28/23 and thinks the pain is stress related no MOI, and sometimes has numbness and tingling in the left arm.  Patient states the neck pain has been going on for less than a month. Tried tylenol, motrin, Advil, massages ice and heat. Patien locates pain to posterior left side of neck and radiates up the base of the head. 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    04/08/2023 Patient states that her neck is better , only uses Celebrex when she needs it    06/03/2023 Patient states her neck is better but now the pain is radiating down her shoulder and down to her hand right side    Relevant Historical Information: None pertinent  Additional pertinent review of systems negative.   Current Outpatient Medications:    cholecalciferol (VITAMIN D3) 25 MCG (1000 UT) tablet, Take 1,000 Units by mouth daily., Disp: , Rfl:    linaclotide (LINZESS) 290 MCG CAPS capsule, Take 1 capsule (290 mcg total) by mouth daily before breakfast., Disp: 90 capsule, Rfl: 1   pantoprazole (PROTONIX) 40 MG tablet, Take 1 tablet (40 mg total) by mouth daily., Disp: 90 tablet, Rfl: 3   Objective:     Vitals:  06/03/23 1420  Pulse: 76  SpO2: 96%  Weight: 135 lb (61.2 kg)  Height: 5\' 2"  (1.575 m)      Body mass index is 24.69 kg/m.    Physical Exam:    Gen: Appears well, nad, nontoxic and pleasant Neuro:sensation intact, strength is 5/5 with df/pf/inv/ev, muscle tone wnl Skin: no suspicious lesion or defmority Psych: A&O, appropriate mood and affect  Right shoulder:  No  deformity, swelling or muscle wasting No scapular winging FF 170 with painful arc, abd 170 with painful arc, int 20, ext 80 NTTP over the [place], clavicle, ac, coracoid, biceps groove, humerus, deltoid, trapezius, cervical spine Positive neer, hawkins, empty can, obriens, crossarm, Negative subscap liftoff, speeds Neg ant drawer, sulcus sign, apprehension Negative Spurling's test bilat FROM of neck     Right wrist:   No deformity or swelling appreciated. ROM  Ext 90 with pain, flexion70 with pain, radial/ulnar deviation 30 TTP ulnar styloid nttp over the snuff box, dorsal carpals, volar carpals, radial styloid,  1st mcp, tfcc Negative Tinel's Negative finklestein Positive tfcc bounce test   pain with resisted ext, flex or deviation   Electronically signed by:  Karen Case [facility] 2:39 PM 06/03/23

## 2023-06-03 ENCOUNTER — Ambulatory Visit (INDEPENDENT_AMBULATORY_CARE_PROVIDER_SITE_OTHER): Payer: BC Managed Care – PPO | Admitting: Sports Medicine

## 2023-06-03 VITALS — HR 76 | Ht 62.0 in | Wt 135.0 lb

## 2023-06-03 DIAGNOSIS — M7551 Bursitis of right shoulder: Secondary | ICD-10-CM | POA: Diagnosis not present

## 2023-06-03 DIAGNOSIS — M25531 Pain in right wrist: Secondary | ICD-10-CM | POA: Diagnosis not present

## 2023-06-03 DIAGNOSIS — M25511 Pain in right shoulder: Secondary | ICD-10-CM | POA: Diagnosis not present

## 2023-06-03 NOTE — Patient Instructions (Signed)
Shoulder HEP  Use topical Voltaren gel over areas of pain  Work not provided  3-4 week follow up

## 2023-06-11 DIAGNOSIS — M25531 Pain in right wrist: Secondary | ICD-10-CM | POA: Diagnosis not present

## 2023-06-21 ENCOUNTER — Other Ambulatory Visit: Payer: Self-pay

## 2023-06-21 MED ORDER — LINACLOTIDE 290 MCG PO CAPS: 290.0000 ug | ORAL_CAPSULE | Freq: Every day | ORAL | 1 refills | Status: DC

## 2023-06-23 NOTE — Progress Notes (Signed)
    Karen Case D.[facility] [address] [place] [address] Phone: [phone]   Assessment and Plan:     1. Acute pain of right shoulder 2. Subacromial bursitis of right shoulder joint  -Acute, improved, subsequent visit - Consistent with significantly improved subacromial bursitis.  If again improvement with subacromial CSI on 06/03/2023, starting HEP.  Patient with mild and intermittent symptoms at this time - May continue Tylenol/NSAIDs as needed for pain relief - Recommend continuing HEP - Patient feels resting and not overusing her shoulder at work has been significantly beneficial.  Recommend no lifting >30 pounds with right upper extremity.  Work note provided for 4 weeks or until reevaluated   Pertinent previous records reviewed include none  Follow Up: 4 weeks for reevaluation.  Could provide work clearance if improved at that time.  Could further evaluate right wrist if still painful   Subjective:   I, Karen Case, am serving as a Neurosurgeon for Doctor Richardean Sale   Chief Complaint: neck pain    HPI:    03/11/2023 Patient is a 57 year old female complaining of neck pain. Patient states Saw PCP for this reason on 02/28/23 and thinks the pain is stress related no MOI, and sometimes has numbness and tingling in the left arm.  Patient states the neck pain has been going on for less than a month. Tried tylenol, motrin, Advil, massages ice and heat. Patien locates pain to posterior left side of neck and radiates up the base of the head. 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    04/08/2023 Patient states that her neck is better , only uses Celebrex when she needs it    06/03/2023 Patient states her neck is better but now the pain is radiating down her shoulder and down to her hand right side   06/24/2023 Patient states she is better . Still has intermittent pain through the shoulder but much improved    Relevant Historical  Information: None pertinent  Additional pertinent review of systems negative.   Current Outpatient Medications:    cholecalciferol (VITAMIN D3) 25 MCG (1000 UT) tablet, Take 1,000 Units by mouth daily., Disp: , Rfl:    linaclotide (LINZESS) 290 MCG CAPS capsule, Take 1 capsule (290 mcg total) by mouth daily before breakfast., Disp: 90 capsule, Rfl: 1   pantoprazole (PROTONIX) 40 MG tablet, Take 1 tablet (40 mg total) by mouth daily., Disp: 90 tablet, Rfl: 3   Objective:     Vitals:   06/24/23 1130  Pulse: 79  SpO2: 98%  Weight: 131 lb (59.4 kg)  Height: 5\' 2"  (1.575 m)      Body mass index is 23.96 kg/m.    Physical Exam:    Gen: Appears well, nad, nontoxic and pleasant Neuro:sensation intact, strength is 5/5 with df/pf/inv/ev, muscle tone wnl Skin: no suspicious lesion or defmority Psych: A&O, appropriate mood and affect   Right shoulder:  No deformity, swelling or muscle wasting No scapular winging FF 170 with painful arc, abd 170 with painful arc, int 20, ext 80 NTTP over the [place], clavicle, ac, coracoid, biceps groove, humerus, deltoid, trapezius, cervical spine Positive neer, hawkins, empty can, obriens, crossarm, Negative subscap liftoff, speeds Neg ant drawer, sulcus sign, apprehension Negative Spurling's test bilat FROM of neck     Electronically signed by:  Karen Case [facility] 3:37 PM 06/24/23

## 2023-06-24 ENCOUNTER — Ambulatory Visit (INDEPENDENT_AMBULATORY_CARE_PROVIDER_SITE_OTHER): Payer: BC Managed Care – PPO | Admitting: Sports Medicine

## 2023-06-24 VITALS — HR 79 | Ht 62.0 in | Wt 131.0 lb

## 2023-06-24 DIAGNOSIS — M7551 Bursitis of right shoulder: Secondary | ICD-10-CM | POA: Diagnosis not present

## 2023-06-24 DIAGNOSIS — M25511 Pain in right shoulder: Secondary | ICD-10-CM | POA: Diagnosis not present

## 2023-06-24 MED ORDER — MELOXICAM 15 MG PO TABS: 15.0000 mg | ORAL_TABLET | Freq: Every day | ORAL | 0 refills | Status: DC

## 2023-06-24 NOTE — Patient Instructions (Addendum)
New work note no lifting more than 30 pounds 4 week follow up  Can use Celebrex once a week as needed  Continue HEP

## 2023-07-12 DIAGNOSIS — M5416 Radiculopathy, lumbar region: Secondary | ICD-10-CM | POA: Diagnosis not present

## 2023-07-12 DIAGNOSIS — M7062 Trochanteric bursitis, left hip: Secondary | ICD-10-CM | POA: Diagnosis not present

## 2023-07-21 NOTE — Progress Notes (Signed)
 Ben Destan Franchini D.[facility] [address] [place] [address] Phone: [phone]   Assessment and Plan:     1. 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  Overall improved with Celebrex  course, though still mild flare with continued activity - Start Tylenol  500 to 1000 mg tablets 2-3 times a day for day-to-day pain relief - May use Celebrex  200 mg daily as needed for breakthrough pain.  Recommend limiting chronic NSAIDs to 1-2 doses per week - May return to work without lifting restriction.  No note provided at today's visit - May use topical Voltaren gel over areas of pain - May use warm hand baths - May use thumb spica wrist brace when in acute flare to decrease strain.  Do not recommend daily use to prevent stiffness and overall weakness  2. Acute pain of right shoulder 3. Subacromial bursitis of right shoulder joint  -Acute, resolved, subsequent visit - Complete improvement in shoulder pain after subacromial CSI on 06/03/2023 - May continue HEP - May continue Tylenol  as needed - No lifting restrictions  Pertinent previous records reviewed include none  Follow Up: As needed   Subjective:   I, Moenique Parris, am serving as a neurosurgeon for Doctor Morene Mace   Chief Complaint: neck pain    HPI:    03/11/2023 Patient is a 58 year old female complaining of neck pain. Patient states Saw PCP for this reason on 02/28/23 and thinks the pain is stress related no MOI, and sometimes has numbness and tingling in the left arm.  Patient states the neck pain has been going on for less than a month. Tried tylenol , motrin, Advil, massages ice and heat. Patien locates pain to posterior left side of neck and radiates up the base of the head. 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    04/08/2023 Patient states that her neck is better ,  only uses Celebrex  when she needs it    06/03/2023 Patient states her neck is better but now the pain is radiating down her shoulder and down to her hand right side    06/24/2023 Patient states she is better . Still has intermittent pain through the shoulder but much improved   07/22/2023 Patient states shoulder is good, wrist is still sore    Relevant Historical Information: None pertinent   Additional pertinent review of systems negative.   Current Outpatient Medications:    cholecalciferol (VITAMIN D3) 25 MCG (1000 UT) tablet, Take 1,000 Units by mouth daily., Disp: , Rfl:    linaclotide  (LINZESS ) 290 MCG CAPS capsule, Take 1 capsule (290 mcg total) by mouth daily before breakfast., Disp: 90 capsule, Rfl: 1   pantoprazole  (PROTONIX ) 40 MG tablet, Take 1 tablet (40 mg total) by mouth daily., Disp: 90 tablet, Rfl: 3   Objective:     Vitals:   07/22/23 1122  BP: 120/78  Pulse: 79  SpO2: 99%  Weight: 130 lb (59 kg)  Height: 5' 2 (1.575 m)      Body mass index is 23.78 kg/m.    Physical Exam:    General: Appears well, nad, nontoxic and pleasant Neuro:sensation intact, strength is 5/5 in upper extremities, muscle tone wnl Skin:no susupicious lesions or rashes  Right wrist:   No deformity or swelling appreciated. ROM  Ext 90, flexion70, radial/ulnar deviation 30 TTP dorsal carpals, ulnar styloid nttp over the snuff box,  volar carpals, radial styloid, 1st mcp, tfcc Negative Tinel's Negative finklestein Neg tfcc bounce test Mild wrist pain with resisted ext, flex or deviation    Electronically signed by:  Odis Mace D.[facility] 11:37 AM 07/22/23

## 2023-07-22 ENCOUNTER — Ambulatory Visit (INDEPENDENT_AMBULATORY_CARE_PROVIDER_SITE_OTHER): Payer: BC Managed Care – PPO | Admitting: Sports Medicine

## 2023-07-22 VITALS — BP 120/78 | HR 79 | Ht 62.0 in | Wt 130.0 lb

## 2023-07-22 DIAGNOSIS — M25511 Pain in right shoulder: Secondary | ICD-10-CM

## 2023-07-22 DIAGNOSIS — M7551 Bursitis of right shoulder: Secondary | ICD-10-CM | POA: Diagnosis not present

## 2023-07-22 DIAGNOSIS — M25531 Pain in right wrist: Secondary | ICD-10-CM | POA: Diagnosis not present

## 2023-07-22 NOTE — Patient Instructions (Signed)
 Tylenol [phone] mg 2-3 times a day for pain relief  Celebrex 200 mg as needed limit NSAID to 1-2 times per week  Voltaren gel over areas of pain  Recommend getting a thumb spica wrist brace to use as needed  As needed follow up

## 2023-08-01 DIAGNOSIS — M1712 Unilateral primary osteoarthritis, left knee: Secondary | ICD-10-CM | POA: Diagnosis not present

## 2023-08-16 DIAGNOSIS — M25562 Pain in left knee: Secondary | ICD-10-CM | POA: Diagnosis not present

## 2023-08-31 ENCOUNTER — Ambulatory Visit: Payer: BC Managed Care – PPO | Admitting: Internal Medicine

## 2023-09-15 DIAGNOSIS — M25562 Pain in left knee: Secondary | ICD-10-CM | POA: Diagnosis not present

## 2023-09-16 ENCOUNTER — Telehealth: Payer: Self-pay

## 2023-09-16 ENCOUNTER — Ambulatory Visit (INDEPENDENT_AMBULATORY_CARE_PROVIDER_SITE_OTHER): Payer: BC Managed Care – PPO | Admitting: Internal Medicine

## 2023-09-16 ENCOUNTER — Encounter: Payer: Self-pay | Admitting: Internal Medicine

## 2023-09-16 VITALS — BP 126/70 | HR 73 | Temp 98.0°F | Resp 16 | Ht 62.0 in | Wt 139.1 lb

## 2023-09-16 DIAGNOSIS — F439 Reaction to severe stress, unspecified: Secondary | ICD-10-CM

## 2023-09-16 DIAGNOSIS — H539 Unspecified visual disturbance: Secondary | ICD-10-CM | POA: Diagnosis not present

## 2023-09-16 DIAGNOSIS — R609 Edema, unspecified: Secondary | ICD-10-CM

## 2023-09-16 MED ORDER — LINACLOTIDE 290 MCG PO CAPS: 290.0000 ug | ORAL_CAPSULE | Freq: Every day | ORAL | 0 refills | Status: DC

## 2023-09-16 NOTE — Telephone Encounter (Signed)
 OK to refill x 3 months Dr. Russella Dar had recommended she f/u in April 2025 so have her set up office visit w/ Dr. Doy Hutching next available or an APP in her pod

## 2023-09-16 NOTE — Progress Notes (Signed)
   Subjective:    Patient ID: Karen Case, female    DOB: 1965/11/23, 58 y.o.   MRN: [id number]  DOS:  09/16/2023 Type of visit - description: Follow-up  Follow-up from previous visit: Neck pain better, stress decreased.  Also reports that some days her hands are swollen when she wakes up;  sometimes at the end of the day she has peri-ankle edema.  Also reports few months history of episodic blurry vision at the right lateral field. Denies fevers, no major headaches, no jaw claudication.   Review of Systems See above   Past Medical History:  Diagnosis Date   Birth control    husband's vasectomy   Chronic constipation 06/30/2011   GERD (gastroesophageal reflux disease)    Lumbar radiculopathy     Past Surgical History:  Procedure Laterality Date   BIOPSY TEAR [facility] Right 2012   Right lacrimal gland biopsy, chronic inflammation, no malignancy   BUNIONECTOMY  2000   Right   LIPOMA EXCISION Right 11/22/2019   R arm    Current Outpatient Medications  Medication Instructions   cholecalciferol (VITAMIN D3) 1,000 Units, Daily   linaclotide (LINZESS) 290 mcg, Oral, Daily before breakfast   pantoprazole (PROTONIX) 40 mg, Oral, Daily       Objective:   Physical Exam BP 126/70   Pulse 73   Temp 98 F (36.7 C) (Oral)   Resp 16   Ht 5\' 2"  (1.575 m)   Wt 139 lb 2 oz (63.1 kg)   LMP 07/15/2012   SpO2 98%   BMI 25.45 kg/m  General:   Well developed, NAD, BMI noted. HEENT:  Normocephalic . Face symmetric, atraumatic EOMI.  Pupils equal and reactive. Temples: Palpable T.A. bilaterally, Neck: Normal carotid pulses Skin: Not pale. Not jaundice Neurologic:  alert & oriented X3.  Speech normal, gait appropriate for age and unassisted Psych--  Cognition and judgment appear intact.  Cooperative with normal attention span and concentration.  Behavior appropriate. No anxious or depressed appearing.      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  Stress at work has decreased significantly, she feels well. Neck pain: Saw sports medicine, resolved. Visual disturbances: As described above, strongly recommend to see her eye doctor ASAP. Check BMP CBC and sed rate. Edema: Occasionally hand or feet edema, recommend low-salt diet. RTC 02-2024 CPX

## 2023-09-16 NOTE — Addendum Note (Signed)
 Addended by: Illene Bolus on: 09/16/2023 04:10 PM   Modules accepted: Orders

## 2023-09-16 NOTE — Telephone Encounter (Signed)
 Prescription has been sent patient's mail order pharmacy. Patient scheduled appt on 10/21/23 at 3:00pm to see Quentin Mulling, PA for follow up. Informed patient's husband and verbalized understanding.

## 2023-09-16 NOTE — Telephone Encounter (Signed)
 Received fax refill request from [facility] for Linzess 290 mcg daily. Previous Dr. Russella Dar patient. Dr. Leone Payor, you are DOD. Please advise on refill.

## 2023-09-16 NOTE — Patient Instructions (Signed)
 Please see your eye doctor as soon as possible   GO TO THE LAB : Get the blood work     Please go to the front desk: Arrange for a physical exam by 02-2024

## 2023-09-17 LAB — BASIC METABOLIC PANEL
BUN: 12 mg/dL (ref 7–25)
CO2: 32 mmol/L (ref 20–32)
Calcium: 9.7 mg/dL (ref 8.6–10.4)
Chloride: 102 mmol/L (ref 98–110)
Creat: 0.72 mg/dL (ref 0.50–1.03)
Glucose, Bld: 91 mg/dL (ref 65–99)
Potassium: 3.5 mmol/L (ref 3.5–5.3)
Sodium: 142 mmol/L (ref 135–146)

## 2023-09-17 LAB — CBC WITH DIFFERENTIAL/PLATELET
Absolute Lymphocytes: 1521 {cells}/uL (ref 850–3900)
Absolute Monocytes: 387 {cells}/uL (ref 200–950)
Basophils Absolute: 48 {cells}/uL (ref 0–200)
Basophils Relative: 0.9 %
Eosinophils Absolute: 90 {cells}/uL (ref 15–500)
Eosinophils Relative: 1.7 %
HCT: 39.2 % (ref 35.0–45.0)
Hemoglobin: 13.1 g/dL (ref 11.7–15.5)
MCH: 32.8 pg (ref 27.0–33.0)
MCHC: 33.4 g/dL (ref 32.0–36.0)
MCV: 98.2 fL (ref 80.0–100.0)
MPV: 9.4 fL (ref 7.5–12.5)
Monocytes Relative: 7.3 %
Neutro Abs: 3254 {cells}/uL (ref 1500–7800)
Neutrophils Relative %: 61.4 %
Platelets: 400 10*3/uL (ref 140–400)
RBC: 3.99 10*6/uL (ref 3.80–5.10)
RDW: 11.4 % (ref 11.0–15.0)
Total Lymphocyte: 28.7 %
WBC: 5.3 10*3/uL (ref 3.8–10.8)

## 2023-09-17 LAB — SEDIMENTATION RATE: Sed Rate: 6 mm/h (ref 0–30)

## 2023-09-17 NOTE — Assessment & Plan Note (Signed)
 Stress: Since last visit, was able to get some counseling.  Stress at work has decreased significantly, she feels well. Neck pain: Saw sports medicine, resolved. Visual disturbances: As described above, strongly recommend to see her eye doctor ASAP. Check BMP CBC and sed rate. Edema: Occasionally hand or feet edema, recommend low-salt diet. RTC 02-2024 CPX

## 2023-09-18 ENCOUNTER — Encounter: Payer: Self-pay | Admitting: Internal Medicine

## 2023-10-21 ENCOUNTER — Ambulatory Visit: Admitting: Physician Assistant

## 2023-10-21 ENCOUNTER — Encounter: Payer: Self-pay | Admitting: Physician Assistant

## 2023-10-21 ENCOUNTER — Other Ambulatory Visit

## 2023-10-21 VITALS — BP 102/64 | HR 64 | Ht 62.0 in | Wt 140.0 lb

## 2023-10-21 DIAGNOSIS — Z860101 Personal history of adenomatous and serrated colon polyps: Secondary | ICD-10-CM | POA: Diagnosis not present

## 2023-10-21 DIAGNOSIS — K581 Irritable bowel syndrome with constipation: Secondary | ICD-10-CM

## 2023-10-21 DIAGNOSIS — K760 Fatty (change of) liver, not elsewhere classified: Secondary | ICD-10-CM | POA: Diagnosis not present

## 2023-10-21 DIAGNOSIS — R1011 Right upper quadrant pain: Secondary | ICD-10-CM

## 2023-10-21 DIAGNOSIS — K219 Gastro-esophageal reflux disease without esophagitis: Secondary | ICD-10-CM | POA: Diagnosis not present

## 2023-10-21 LAB — COMPREHENSIVE METABOLIC PANEL WITH GFR
ALT: 19 U/L (ref 0–35)
AST: 16 U/L (ref 0–37)
Albumin: 4.6 g/dL (ref 3.5–5.2)
Alkaline Phosphatase: 84 U/L (ref 39–117)
BUN: 21 mg/dL (ref 6–23)
CO2: 31 meq/L (ref 19–32)
Calcium: 9.6 mg/dL (ref 8.4–10.5)
Chloride: 103 meq/L (ref 96–112)
Creatinine, Ser: 0.84 mg/dL (ref 0.40–1.20)
GFR: 77.38 mL/min (ref >60.00)
Glucose, Bld: 95 mg/dL (ref 70–99)
Potassium: 3.6 meq/L (ref 3.5–5.1)
Sodium: 140 meq/L (ref 135–145)
Total Bilirubin: 0.6 mg/dL (ref 0.2–1.2)
Total Protein: 6.8 g/dL (ref 6.0–8.3)

## 2023-10-21 LAB — CBC WITH DIFFERENTIAL/PLATELET
Basophils Absolute: 0 10*3/uL (ref 0.0–0.1)
Basophils Relative: 0.7 % (ref 0.0–3.0)
Eosinophils Absolute: 0.2 10*3/uL (ref 0.0–0.7)
Eosinophils Relative: 2.6 % (ref 0.0–5.0)
HCT: 37.8 % (ref 36.0–46.0)
Hemoglobin: 13.1 g/dL (ref 12.0–15.0)
Lymphocytes Relative: 31.9 % (ref 12.0–46.0)
Lymphs Abs: 1.9 10*3/uL (ref 0.7–4.0)
MCHC: 34.7 g/dL (ref 30.0–36.0)
MCV: 96.3 fl (ref 78.0–100.0)
Monocytes Absolute: 0.5 10*3/uL (ref 0.1–1.0)
Monocytes Relative: 7.7 % (ref 3.0–12.0)
Neutro Abs: 3.4 10*3/uL (ref 1.4–7.7)
Neutrophils Relative %: 57.1 % (ref 43.0–77.0)
Platelets: 374 10*3/uL (ref 150.0–400.0)
RBC: 3.93 Mil/uL (ref 3.87–5.11)
RDW: 12.2 % (ref 11.5–15.5)
WBC: 6 10*3/uL (ref 4.0–10.5)

## 2023-10-21 MED ORDER — PANTOPRAZOLE SODIUM 40 MG PO TBEC: 40.0000 mg | DELAYED_RELEASE_TABLET | Freq: Two times a day (BID) | ORAL | 0 refills | Status: DC

## 2023-10-21 NOTE — Patient Instructions (Addendum)
 Your provider has requested that you go to the basement level for lab work before leaving today. Press "B" on the elevator. The lab is located at the first door on the left as you exit the elevator.  You will be contacted by [facility] Scheduling in the next 2 days to arrange a RUQ ultrasound.  The number on your caller ID will be [phone], please answer when they call.  If you have not heard from them in 2 days please call [phone] to schedule.     Please take your proton pump inhibitor medication, pantoprazole 40 mg TWICE a day for at least 8 weeks and then once a day  Please take this medication 30 minutes to 1 hour before meals- this makes it more effective.  Avoid spicy and acidic foods Avoid fatty foods Limit your intake of coffee, tea, alcohol, and carbonated drinks Work to maintain a healthy weight Keep the head of the bed elevated at least 3 inches with blocks or a wedge pillow if you are having any nighttime symptoms Stay upright for 2 hours after eating Avoid meals and snacks three to four hours before bedtime  Go to the ER if you have coffee ground vomiting, unable to keep down food or drink, black tarry stools, dizziness, severe pain, shortness of breath or chest pain.

## 2023-10-21 NOTE — Progress Notes (Signed)
 Agree with the assessment and plan as outlined by Quentin Mulling, PA-C. ? ?Keron Neenan, DO, FACG ? ?

## 2023-10-21 NOTE — Progress Notes (Signed)
 10/21/2023 Heath Gold [id number] 16-Jan-1966  Referring provider: Wanda Plump, MD Primary GI doctor: Dr. Barron Alvine ( Dr. Russella Dar)  ASSESSMENT AND PLAN:   GERD with history of small hiatal hernia, on protonix 40 mg once daily that works well except for the past month has been having GERD, worse with anything, No dysphagia, no melena previous H. pylori gastritis eradication study 2022 February 2022 EGD normal esophagus, small HH, gastritis, normal duodenal bulb She has been on naproxen and meloxicam for her left knee, 3 x a week for 2 months, no ETOH -Likely NSAID induced GERD, increase pantoprazole to 40 mg BID -Some RUQ on palpation with positive murphy, will get RUQ - follow up 2-3 months, consider repeat EGD if not improving  IBS with constipation 12/2022 CTAP left lower quadrant pain fatty filtration of liver small esophageal hiatal hernia otherwise unremarkable Previously tried and failed MiraLAX, Dulcolax and Trulance Linzess 290 increased from 145 04/2023 -having BM twice a day, linzess is working well -No rectal bleeding - continue linzess 290 mcg  History of adenomatous colon polyps 08/20/2020 colonoscopy Dr. Russella Dar good bowel prep 2 polyps 4 to 5 mm transverse ascending colon, mild diverticulosis left colon Recall February 2029  Hepatic steatosis Seen via CT scan 12/17/2022 Unremarkable LFTS Check LFTS and CBC every 6 months, weight loss discussed    Latest Ref Rng & Units 12/17/2022    2:05 PM 12/13/2022   10:40 AM 02/24/2022   10:58 AM  Hepatic Function  Total Protein 6.5 - 8.1 g/dL 7.0  6.9  6.6   Albumin 3.5 - 5.0 g/dL 4.1  4.5  4.4   AST 15 - 41 U/L 20  19  12    ALT 0 - 44 U/L 23  27  12    Alk Phosphatase 38 - 126 U/L 78  78  93   Total Bilirubin 0.3 - 1.2 mg/dL 0.7  0.8  1.0    Left knee OA Planning on surgery the 23    Patient Care Team: Wanda Plump, MD as PCP - General (Internal Medicine) Myna Hidalgo, DO as Consulting Physician (Obstetrics and  Gynecology) Drema Dallas, DO as Consulting Physician (Neurology) Joneen Caraway, PA-C as Physician Assistant (Dermatology) Estill Bamberg, MD as Consulting Physician (Orthopedic Surgery)  HISTORY OF PRESENT ILLNESS: 58 y.o. female with a past medical history listed below presents for evaluation of constipation.  Last seen in the office 04/13/2023 by Dr. Russella Dar for IBS constipation predominant, GERD with small hiatal hernia previous H. pylori treated 2022 with eradication study.  Patient is well-controlled with Linzess 290 mcg daily, pantoprazole 40 mg daily.  Due for surveillance colonoscopy February 2029.  Discussed the use of AI scribe software for clinical note transcription with the patient, who gave verbal consent to proceed.  History of Present Illness   Dana I Mault is a 58 year old female with gastroesophageal reflux disease who presents with worsening heartburn and abdominal discomfort.  She has been experiencing significant heartburn over the past month, particularly at night, which is exacerbated by eating. The heartburn occurs even with foods that previously did not cause issues, such as salads. She describes the sensation as heartburn rather than acid reflux. Occasional bloating and a sensation of fullness after eating small portions are noted, accompanied by lower left abdominal pain. No nausea, vomiting, or difficulty swallowing. She denies any blood in her stool, dark black stools, or any trouble swallowing.  She has been using naproxen and meloxicam for  knee pain, taking them three times a week for the past two months. Meloxicam was initially effective but no longer provides relief, prompting her to try naproxen.  She has been taking pantoprazole 40 mg once daily in the morning, which had been effective until the last month. Her bowel movements have improved with the increased dose of Linzess to 290 mcg, taken 30 minutes before meals, resulting in two bowel movements daily  without straining.  No alcohol consumption.      She  reports that she has never smoked. She has never used smokeless tobacco. She reports current alcohol use. She reports that she does not use drugs.  RELEVANT GI HISTORY, IMAGING AND LABS: Results          CBC    Component Value Date/Time   WBC 5.3 09/16/2023 1340   RBC 3.99 09/16/2023 1340   HGB 13.1 09/16/2023 1340   HCT 39.2 09/16/2023 1340   PLT 400 09/16/2023 1340   MCV 98.2 09/16/2023 1340   MCH 32.8 09/16/2023 1340   MCHC 33.4 09/16/2023 1340   RDW 11.4 09/16/2023 1340   LYMPHSABS 1.5 12/13/2022 1040   MONOABS 0.3 12/13/2022 1040   EOSABS 90 09/16/2023 1340   BASOSABS 48 09/16/2023 1340   Recent Labs    12/13/22 1040 12/17/22 1405 09/16/23 1340  HGB 12.6 13.0 13.1    CMP     Component Value Date/Time   NA 142 09/16/2023 1340   K 3.5 09/16/2023 1340   CL 102 09/16/2023 1340   CO2 32 09/16/2023 1340   GLUCOSE 91 09/16/2023 1340   BUN 12 09/16/2023 1340   CREATININE 0.72 09/16/2023 1340   CALCIUM 9.7 09/16/2023 1340   PROT 7.0 12/17/2022 1405   ALBUMIN 4.1 12/17/2022 1405   AST 20 12/17/2022 1405   ALT 23 12/17/2022 1405   ALKPHOS 78 12/17/2022 1405   BILITOT 0.7 12/17/2022 1405   GFRNONAA >60 12/17/2022 1405   GFRNONAA 102 12/21/2018 0955   GFRAA 118 12/21/2018 0955      Latest Ref Rng & Units 12/17/2022    2:05 PM 12/13/2022   10:40 AM 02/24/2022   10:58 AM  Hepatic Function  Total Protein 6.5 - 8.1 g/dL 7.0  6.9  6.6   Albumin 3.5 - 5.0 g/dL 4.1  4.5  4.4   AST 15 - 41 U/L 20  19  12    ALT 0 - 44 U/L 23  27  12    Alk Phosphatase 38 - 126 U/L 78  78  93   Total Bilirubin 0.3 - 1.2 mg/dL 0.7  0.8  1.0       Current Medications:        Current Outpatient Medications (Other):    cholecalciferol (VITAMIN D3) 25 MCG (1000 UT) tablet, Take 1,000 Units by mouth daily.   linaclotide (LINZESS) 290 MCG CAPS capsule, Take 1 capsule (290 mcg total) by mouth daily before breakfast.    pantoprazole (PROTONIX) 40 MG tablet, Take 1 tablet (40 mg total) by mouth 2 (two) times daily before a meal.  Medical History:  Past Medical History:  Diagnosis Date   Birth control    husband's vasectomy   Chronic constipation 06/30/2011   GERD (gastroesophageal reflux disease)    IBS (irritable bowel syndrome)    Lumbar radiculopathy    Allergies: No Known Allergies   Surgical History:  She  has a past surgical history that includes Bunionectomy (2000); Biopsy tear sac (Right, 2012); and Lipoma excision (Right,  11/22/2019). Family History:  Her family history includes Heart attack in her father; Hypertension in her mother.  REVIEW OF SYSTEMS  : All other systems reviewed and negative except where noted in the History of Present Illness.  PHYSICAL EXAM: BP 102/64   Pulse 64   Ht 5\' 2"  (1.575 m)   Wt 140 lb (63.5 kg)   LMP 07/15/2012   BMI 25.61 kg/m  Physical Exam   GENERAL APPEARANCE: Well nourished, in no apparent distress. HEENT: No cervical lymphadenopathy, unremarkable thyroid, sclerae anicteric, conjunctiva pink. RESPIRATORY: Respiratory effort normal, breath sounds equal bilaterally without rales, rhonchi, or wheezing. CARDIO: Regular rate and rhythm with no murmurs, rubs, or gallops, peripheral pulses intact. ABDOMEN: Soft, non-distended, active bowel sounds in all four quadrants, right upper quadrant tenderness on palpation, no tenderness in other abdominal quadrants, no rebound, no mass appreciated. RECTAL: Declines. MUSCULOSKELETAL: Full range of motion, normal gait, without edema. SKIN: Dry, intact without rashes or lesions. No jaundice. NEURO: Alert, oriented, no focal deficits. PSYCH: Cooperative, normal mood and affect.      Doree Albee, PA-C 3:43 PM

## 2023-10-31 ENCOUNTER — Ambulatory Visit (HOSPITAL_COMMUNITY)

## 2023-11-07 DIAGNOSIS — X58XXXA Exposure to other specified factors, initial encounter: Secondary | ICD-10-CM | POA: Diagnosis not present

## 2023-11-07 DIAGNOSIS — M2242 Chondromalacia patellae, left knee: Secondary | ICD-10-CM | POA: Diagnosis not present

## 2023-11-07 DIAGNOSIS — M6752 Plica syndrome, left knee: Secondary | ICD-10-CM | POA: Diagnosis not present

## 2023-11-07 DIAGNOSIS — Y999 Unspecified external cause status: Secondary | ICD-10-CM | POA: Diagnosis not present

## 2023-11-07 DIAGNOSIS — G8918 Other acute postprocedural pain: Secondary | ICD-10-CM | POA: Diagnosis not present

## 2023-11-07 DIAGNOSIS — M94262 Chondromalacia, left knee: Secondary | ICD-10-CM | POA: Diagnosis not present

## 2023-11-07 DIAGNOSIS — S83282A Other tear of lateral meniscus, current injury, left knee, initial encounter: Secondary | ICD-10-CM | POA: Diagnosis not present

## 2023-11-07 DIAGNOSIS — M2342 Loose body in knee, left knee: Secondary | ICD-10-CM | POA: Diagnosis not present

## 2023-11-07 DIAGNOSIS — S83272A Complex tear of lateral meniscus, current injury, left knee, initial encounter: Secondary | ICD-10-CM | POA: Diagnosis not present

## 2023-11-07 HISTORY — PX: OTHER SURGICAL HISTORY: SHX169

## 2023-11-14 DIAGNOSIS — M25662 Stiffness of left knee, not elsewhere classified: Secondary | ICD-10-CM | POA: Diagnosis not present

## 2023-11-14 DIAGNOSIS — S83262D Peripheral tear of lateral meniscus, current injury, left knee, subsequent encounter: Secondary | ICD-10-CM | POA: Diagnosis not present

## 2023-11-16 DIAGNOSIS — M25662 Stiffness of left knee, not elsewhere classified: Secondary | ICD-10-CM | POA: Diagnosis not present

## 2023-11-16 DIAGNOSIS — S83262D Peripheral tear of lateral meniscus, current injury, left knee, subsequent encounter: Secondary | ICD-10-CM | POA: Diagnosis not present

## 2023-11-18 DIAGNOSIS — S83262D Peripheral tear of lateral meniscus, current injury, left knee, subsequent encounter: Secondary | ICD-10-CM | POA: Diagnosis not present

## 2023-11-18 DIAGNOSIS — M25662 Stiffness of left knee, not elsewhere classified: Secondary | ICD-10-CM | POA: Diagnosis not present

## 2023-11-21 DIAGNOSIS — S83262D Peripheral tear of lateral meniscus, current injury, left knee, subsequent encounter: Secondary | ICD-10-CM | POA: Diagnosis not present

## 2023-11-21 DIAGNOSIS — M25662 Stiffness of left knee, not elsewhere classified: Secondary | ICD-10-CM | POA: Diagnosis not present

## 2023-11-22 ENCOUNTER — Other Ambulatory Visit: Payer: Self-pay | Admitting: Internal Medicine

## 2023-11-22 DIAGNOSIS — Z1231 Encounter for screening mammogram for malignant neoplasm of breast: Secondary | ICD-10-CM

## 2023-11-23 DIAGNOSIS — S83262D Peripheral tear of lateral meniscus, current injury, left knee, subsequent encounter: Secondary | ICD-10-CM | POA: Diagnosis not present

## 2023-11-23 DIAGNOSIS — Z01419 Encounter for gynecological examination (general) (routine) without abnormal findings: Secondary | ICD-10-CM | POA: Diagnosis not present

## 2023-11-23 DIAGNOSIS — M25662 Stiffness of left knee, not elsewhere classified: Secondary | ICD-10-CM | POA: Diagnosis not present

## 2023-11-25 DIAGNOSIS — S83262D Peripheral tear of lateral meniscus, current injury, left knee, subsequent encounter: Secondary | ICD-10-CM | POA: Diagnosis not present

## 2023-11-25 DIAGNOSIS — M25662 Stiffness of left knee, not elsewhere classified: Secondary | ICD-10-CM | POA: Diagnosis not present

## 2023-11-28 ENCOUNTER — Ambulatory Visit: Payer: Self-pay | Admitting: Physician Assistant

## 2023-11-28 ENCOUNTER — Ambulatory Visit (HOSPITAL_COMMUNITY)
Admission: RE | Admit: 2023-11-28 | Discharge: 2023-11-28 | Disposition: A | Discharge status: Home / Self Care | Source: Ambulatory Visit | Attending: Physician Assistant | Admitting: Physician Assistant

## 2023-11-28 DIAGNOSIS — R1011 Right upper quadrant pain: Secondary | ICD-10-CM | POA: Diagnosis not present

## 2023-11-28 DIAGNOSIS — K760 Fatty (change of) liver, not elsewhere classified: Secondary | ICD-10-CM | POA: Diagnosis not present

## 2023-11-28 DIAGNOSIS — K219 Gastro-esophageal reflux disease without esophagitis: Secondary | ICD-10-CM | POA: Diagnosis not present

## 2023-11-29 DIAGNOSIS — S83262D Peripheral tear of lateral meniscus, current injury, left knee, subsequent encounter: Secondary | ICD-10-CM | POA: Diagnosis not present

## 2023-11-29 DIAGNOSIS — M25662 Stiffness of left knee, not elsewhere classified: Secondary | ICD-10-CM | POA: Diagnosis not present

## 2023-12-01 DIAGNOSIS — M25662 Stiffness of left knee, not elsewhere classified: Secondary | ICD-10-CM | POA: Diagnosis not present

## 2023-12-01 DIAGNOSIS — S83262D Peripheral tear of lateral meniscus, current injury, left knee, subsequent encounter: Secondary | ICD-10-CM | POA: Diagnosis not present

## 2023-12-05 ENCOUNTER — Emergency Department (HOSPITAL_COMMUNITY)

## 2023-12-05 ENCOUNTER — Encounter (HOSPITAL_BASED_OUTPATIENT_CLINIC_OR_DEPARTMENT_OTHER): Payer: Self-pay

## 2023-12-05 ENCOUNTER — Other Ambulatory Visit: Payer: Self-pay

## 2023-12-05 ENCOUNTER — Emergency Department (HOSPITAL_BASED_OUTPATIENT_CLINIC_OR_DEPARTMENT_OTHER)
Admission: EM | Admit: 2023-12-05 | Discharge: 2023-12-05 | Disposition: A | Discharge status: Home / Self Care | Attending: Emergency Medicine | Admitting: Emergency Medicine

## 2023-12-05 DIAGNOSIS — K5792 Diverticulitis of intestine, part unspecified, without perforation or abscess without bleeding: Secondary | ICD-10-CM

## 2023-12-05 DIAGNOSIS — R110 Nausea: Secondary | ICD-10-CM | POA: Insufficient documentation

## 2023-12-05 DIAGNOSIS — R000 Tachycardia, unspecified: Secondary | ICD-10-CM | POA: Diagnosis not present

## 2023-12-05 DIAGNOSIS — R197 Diarrhea, unspecified: Secondary | ICD-10-CM | POA: Diagnosis not present

## 2023-12-05 DIAGNOSIS — R1032 Left lower quadrant pain: Secondary | ICD-10-CM | POA: Diagnosis not present

## 2023-12-05 DIAGNOSIS — K5732 Diverticulitis of large intestine without perforation or abscess without bleeding: Secondary | ICD-10-CM | POA: Diagnosis not present

## 2023-12-05 DIAGNOSIS — R1084 Generalized abdominal pain: Secondary | ICD-10-CM | POA: Diagnosis not present

## 2023-12-05 LAB — URINALYSIS, ROUTINE W REFLEX MICROSCOPIC
Bilirubin Urine: NEGATIVE
Glucose, UA: NEGATIVE mg/dL
Nitrite: NEGATIVE
Specific Gravity, Urine: 1.021 (ref 1.005–1.030)
pH: 5.5 (ref 5.0–8.0)

## 2023-12-05 LAB — COMPREHENSIVE METABOLIC PANEL WITH GFR
ALT: 18 U/L (ref 0–44)
AST: 16 U/L (ref 15–41)
Albumin: 4.3 g/dL (ref 3.5–5.0)
Alkaline Phosphatase: 113 U/L (ref 38–126)
Anion gap: 12 (ref 5–15)
BUN: 16 mg/dL (ref 6–20)
CO2: 26 mmol/L (ref 22–32)
Calcium: 10.1 mg/dL (ref 8.9–10.3)
Chloride: 98 mmol/L (ref 98–111)
Creatinine, Ser: 0.65 mg/dL (ref 0.44–1.00)
GFR, Estimated: >60 mL/min (ref >60)
Glucose, Bld: 107 mg/dL — ABNORMAL HIGH (ref 70–99)
Potassium: 4 mmol/L (ref 3.5–5.1)
Sodium: 136 mmol/L (ref 135–145)
Total Bilirubin: 0.9 mg/dL (ref 0.0–1.2)
Total Protein: 7.4 g/dL (ref 6.5–8.1)

## 2023-12-05 LAB — CBC
HCT: 39.3 % (ref 36.0–46.0)
Hemoglobin: 13.3 g/dL (ref 12.0–15.0)
MCH: 32.1 pg (ref 26.0–34.0)
MCHC: 33.8 g/dL (ref 30.0–36.0)
MCV: 94.9 fL (ref 80.0–100.0)
Platelets: 402 10*3/uL — ABNORMAL HIGH (ref 150–400)
RBC: 4.14 MIL/uL (ref 3.87–5.11)
RDW: 11.4 % — ABNORMAL LOW (ref 11.5–15.5)
WBC: 12.5 10*3/uL — ABNORMAL HIGH (ref 4.0–10.5)
nRBC: 0 % (ref 0.0–0.2)

## 2023-12-05 LAB — LIPASE, BLOOD: Lipase: 12 U/L (ref 11–51)

## 2023-12-05 MED ORDER — IOHEXOL 350 MG/ML SOLN
75.0000 mL | Freq: Once | INTRAVENOUS | Status: AC | PRN
  Administered 2023-12-05: 75 mL via INTRAVENOUS

## 2023-12-05 MED ORDER — KETOROLAC TROMETHAMINE 15 MG/ML IJ SOLN
15.0000 mg | Freq: Once | INTRAMUSCULAR | Status: AC
  Administered 2023-12-05: 15 mg via INTRAVENOUS
  Filled 2023-12-05: qty 1

## 2023-12-05 MED ORDER — MORPHINE SULFATE (PF) 4 MG/ML IV SOLN
4.0000 mg | Freq: Once | INTRAVENOUS | Status: AC
  Administered 2023-12-05: 4 mg via INTRAVENOUS
  Filled 2023-12-05: qty 1

## 2023-12-05 MED ORDER — ONDANSETRON HCL 4 MG/2ML IJ SOLN
4.0000 mg | Freq: Once | INTRAMUSCULAR | Status: AC
  Administered 2023-12-05: 4 mg via INTRAVENOUS
  Filled 2023-12-05: qty 2

## 2023-12-05 MED ORDER — ONDANSETRON 4 MG PO TBDP: 4.0000 mg | ORAL_TABLET | Freq: Three times a day (TID) | ORAL | 0 refills | Status: DC | PRN

## 2023-12-05 MED ORDER — HYDROCODONE-ACETAMINOPHEN 5-325 MG PO TABS: 1.0000 | ORAL_TABLET | Freq: Four times a day (QID) | ORAL | 0 refills | Status: DC | PRN

## 2023-12-05 MED ORDER — AMOXICILLIN-POT CLAVULANATE 875-125 MG PO TABS: 1.0000 | ORAL_TABLET | Freq: Two times a day (BID) | ORAL | 0 refills | Status: DC

## 2023-12-05 NOTE — ED Notes (Signed)
 Pt not present for assessment,will assess when returns.

## 2023-12-05 NOTE — ED Provider Notes (Signed)
  EMERGENCY DEPARTMENT AT [facility] Provider Note  CSN: [id number] Arrival date & time: 12/05/23 1026  Chief Complaint(s) Abdominal Pain  HPI Karen Case is a 58 y.o. female who is here today for left lower quadrant abdominal pain that began last evening.  Patient has intermittently had diarrhea and felt nauseated.   Past Medical History Past Medical History:  Diagnosis Date   Birth control    husband's vasectomy   Chronic constipation 06/30/2011   GERD (gastroesophageal reflux disease)    IBS (irritable bowel syndrome)    Lumbar radiculopathy    Patient Active Problem List   Diagnosis Date Noted   Lumbar radiculopathy 03/01/2022   Stress incontinence (female) (female) 02/11/2022   History of colonic polyps 06/13/2020   Right knee pain 08/13/2019   Left knee pain 10/02/2017   PCP NOTES >>>>> 03/28/2015   Lumbalgia 08/22/2014   Annual physical exam 06/30/2011   Chronic constipation 06/30/2011   Neck pain 06/30/2011   Home Medication(s) Prior to Admission medications   Medication Sig Start Date End Date Taking? Authorizing Provider  cholecalciferol (VITAMIN D3) 25 MCG (1000 UT) tablet Take 1,000 Units by mouth daily.    [provider]  linaclotide  (LINZESS ) 290 MCG CAPS capsule Take 1 capsule (290 mcg total) by mouth daily before breakfast. 09/16/23   Kenney Peacemaker, MD  pantoprazole  (PROTONIX ) 40 MG tablet Take 1 tablet (40 mg total) by mouth 2 (two) times daily before a meal. 10/21/23   Edmonia Gottron, PA-C                                                                                                                                    Past Surgical History Past Surgical History:  Procedure Laterality Date   BIOPSY TEAR [facility] Right 2012   Right lacrimal gland biopsy, chronic inflammation, no malignancy   BUNIONECTOMY  2000   Right   LIPOMA EXCISION Right 11/22/2019   R arm   Family History Family History  Problem Relation Age of  Onset   Hypertension Mother        M   Heart attack Father        28   Hyperlipidemia Neg Hx    Stroke Neg Hx    Colon cancer Neg Hx    Breast cancer Neg Hx    Esophageal cancer Neg Hx    Liver cancer Neg Hx    Pancreatic cancer Neg Hx    Prostate cancer Neg Hx    Rectal cancer Neg Hx    Stomach cancer Neg Hx     Social History Social History   Tobacco Use   Smoking status: Never   Smokeless tobacco: Never  Vaping Use   Vaping status: Never Used  Substance Use Topics   Alcohol use: Yes    Alcohol/week: 0.0 standard drinks of alcohol    Comment: socially    Drug  use: No   Allergies Patient has no known allergies.  Review of Systems Review of Systems  Physical Exam Vital Signs  I have reviewed the triage vital signs BP 97/63 (BP Location: Right Arm)   Pulse 99   Temp 98.6 F (37 C)   Resp 18   Ht 5\' 3"  (1.6 m)   Wt 62.6 kg   LMP 07/15/2012   SpO2 100%   BMI 24.45 kg/m   Physical Exam Vitals and nursing note reviewed.  Cardiovascular:     Rate and Rhythm: Tachycardia present.  Abdominal:     General: Abdomen is flat.     Palpations: Abdomen is soft.     Tenderness: There is abdominal tenderness in the left lower quadrant. There is guarding.  Neurological:     Mental Status: She is alert.     ED Results and Treatments Labs (all labs ordered are listed, but only abnormal results are displayed) Labs Reviewed  CBC - Abnormal; Notable for the following components:      Result Value   WBC 12.5 (*)    RDW 11.4 (*)    Platelets 402 (*)    All other components within normal limits  URINALYSIS, ROUTINE W REFLEX MICROSCOPIC - Abnormal; Notable for the following components:   Hgb urine dipstick MODERATE (*)    Ketones, ur TRACE (*)    Protein, ur TRACE (*)    Leukocytes,Ua LARGE (*)    Bacteria, UA RARE (*)    All other components within normal limits  LIPASE, BLOOD  COMPREHENSIVE METABOLIC PANEL WITH GFR                                                                                                                           Radiology No results found.  Pertinent labs & imaging results that were available during my care of the patient were reviewed by me and considered in my medical decision making (see MDM for details).  Medications Ordered in ED Medications  ondansetron (ZOFRAN) injection 4 mg (has no administration in time range)  ketorolac  (TORADOL ) 15 MG/ML injection 15 mg (has no administration in time range)  morphine (PF) 4 MG/ML injection 4 mg (has no administration in time range)  Procedures Procedures  (including critical care time)  Medical Decision Making / ED Course   This patient presents to the ED for concern of left lower quadrant abdominal pain, this involves an extensive number of treatment options, and is a complaint that carries with it a high risk of complications and morbidity.  The differential diagnosis includes diverticulitis, diverticulitis with complication, nephrolithiasis, less likely obstruction, enteritis.  MDM: Patient exquisitely tender left lower quadrant.  I am concerned for diverticulitis in this patient.  We do not have CT imaging available due to it being broken.  Blood work has been ordered.  Analgesia provided.  She is going to travel by private vehicle with her husband to the [facility] emergency room for CT imaging.  Accepting physician is Dr. Dolan Freiberg.   Additional history obtained: -Additional history obtained from  -External records from outside source obtained and reviewed including: Chart review including previous notes, labs, imaging, consultation notes   Lab Tests: -I ordered, reviewed, and interpreted labs.   The pertinent results include:   Labs Reviewed  CBC - Abnormal; Notable for the following components:      Result Value   WBC 12.5 (*)     RDW 11.4 (*)    Platelets 402 (*)    All other components within normal limits  URINALYSIS, ROUTINE W REFLEX MICROSCOPIC - Abnormal; Notable for the following components:   Hgb urine dipstick MODERATE (*)    Ketones, ur TRACE (*)    Protein, ur TRACE (*)    Leukocytes,Ua LARGE (*)    Bacteria, UA RARE (*)    All other components within normal limits  LIPASE, BLOOD  COMPREHENSIVE METABOLIC PANEL WITH GFR     Medicines ordered and prescription drug management: Meds ordered this encounter  Medications   ondansetron (ZOFRAN) injection 4 mg   ketorolac  (TORADOL ) 15 MG/ML injection 15 mg   morphine (PF) 4 MG/ML injection 4 mg    -I have reviewed the patients home medicines and have made adjustments as needed   Cardiac Monitoring: The patient was maintained on a cardiac monitor.  I personally viewed and interpreted the cardiac monitored which showed an underlying rhythm of: Normal sinus rhythm  Social Determinants of Health:  Factors impacting patients care include: Lack of access to primary care   Reevaluation: After the interventions noted above, I reevaluated the patient and found that they have :improved  Co morbidities that complicate the patient evaluation  Past Medical History:  Diagnosis Date   Birth control    husband's vasectomy   Chronic constipation 06/30/2011   GERD (gastroesophageal reflux disease)    IBS (irritable bowel syndrome)    Lumbar radiculopathy       Dispostion: Transfer via private vehicle     Final Clinical Impression(s) / ED Diagnoses Final diagnoses:  Left lower quadrant abdominal pain     @PCDICTATION @    Afton Horse T, DO 12/05/23 1137

## 2023-12-05 NOTE — ED Provider Notes (Addendum)
 Patient sent in from [facility] because CAT scan there is down for CT abdomen patient with a lot of abdominal tenderness.  Can clinical concern was for diverticulitis.  CT scan has been ordered.   Karen Bielinski, MD 12/05/23 1244   CT scan consistent with uncomplicated diverticulitis.  Patient's labs without significant abnormalities.  White count was 12.5.  Otherwise negative.  Will treat with Augmentin and some pain medicine and have her follow-up with her primary care doctor and she is already followed by [facility] will give her that information again.  Patient will return for any new or worse symptoms.   [name], MD 12/05/23 [phone]

## 2023-12-05 NOTE — ED Notes (Signed)
 Paper work reviewed with pt through interpreter, education on antibiotics and opoid use provided per discharge paperwork. No new onset distress at this time. Pt stated she was in pain but did not want to seek further care opting to get medicine at pharmacy. Pt taken to front by tech via wheelchair.

## 2023-12-05 NOTE — ED Triage Notes (Signed)
 In for eval of LLQ abd pain that radiates to left back onset yesterday. Positive for nausea, vomiting, and diarrhea. Slight dysuria and reports urine was dark and smelled strong.

## 2023-12-05 NOTE — Discharge Instructions (Addendum)
 CT scan shows diverticulitis.  Take the antibiotic as directed Augmentin.  Would expect improvement over the next couple days.  Return for anything new or worse.  Take the pain medication as needed.  And also some antinausea medicine provided just in case the pain medicine makes you nauseated.

## 2023-12-05 NOTE — ED Notes (Signed)
 [facility] will accept.

## 2023-12-06 DIAGNOSIS — S83262D Peripheral tear of lateral meniscus, current injury, left knee, subsequent encounter: Secondary | ICD-10-CM | POA: Diagnosis not present

## 2023-12-06 DIAGNOSIS — M25662 Stiffness of left knee, not elsewhere classified: Secondary | ICD-10-CM | POA: Diagnosis not present

## 2023-12-08 ENCOUNTER — Ambulatory Visit
Admission: RE | Admit: 2023-12-08 | Discharge: 2023-12-08 | Disposition: A | Discharge status: Home / Self Care | Source: Ambulatory Visit | Attending: Internal Medicine | Admitting: Internal Medicine

## 2023-12-08 ENCOUNTER — Ambulatory Visit: Payer: Self-pay

## 2023-12-08 DIAGNOSIS — Z1231 Encounter for screening mammogram for malignant neoplasm of breast: Secondary | ICD-10-CM

## 2023-12-08 DIAGNOSIS — S83262D Peripheral tear of lateral meniscus, current injury, left knee, subsequent encounter: Secondary | ICD-10-CM | POA: Diagnosis not present

## 2023-12-08 DIAGNOSIS — M25662 Stiffness of left knee, not elsewhere classified: Secondary | ICD-10-CM | POA: Diagnosis not present

## 2023-12-08 NOTE — Telephone Encounter (Signed)
 Copied from CRM [id number]. Topic: Clinical - Red Word Triage >> Dec 08, 2023  9:45 AM Karen Case P wrote: Red Word that prompted transfer to Nurse Triage: Nausea , fatigue, headache - was discharged 05/26, symptom getting worst.   Chief Complaint: nausea, HFU Symptoms: nausea increased thrist Frequency: constant Pertinent Negatives: Patient denies fever Disposition: [] ED /[] Urgent Care (no appt availability in office) / [] Appointment(In office/virtual)/ []  [facility]/ [] Home Care/ [] Refused Recommended Disposition /[] [facility]/ []  Follow-up with PCP Additional Notes: also endorsing headache and fatigue, cold chills. Also feels very thirsty, despite nausea  Reason for Disposition  Nausea is a chronic symptom (recurrent or ongoing AND present > 4 weeks)  Answer Assessment - Initial Assessment Questions 1. NAUSEA SEVERITY: "How bad is the nausea?" (e.g., mild, moderate, severe; dehydration, weight loss)   - MILD: loss of appetite without change in eating habits   - MODERATE: decreased oral intake without significant weight loss, dehydration, or malnutrition   - SEVERE: inadequate caloric or fluid intake, significant weight loss, symptoms of dehydration     Mild to moderate, able to eat and drink, but sometimes the nause is there whether she eats or drinks  2. ONSET: "When did the nausea begin?"     Off/on a couple of week. Has been getting worse over past 2 days. More so in the morning  3. VOMITING: "Any vomiting?" If Yes, ask: "How many times today?"     No vomiting now, but was vomiting earlier this week  4. RECURRENT SYMPTOM: "Have you had nausea before?" If Yes, ask: "When was the last time?" "What happened that time?"     Yes, Has history of diverticulitis  5. CAUSE: "What do you think is causing the nausea?"     Unsure of cause but could be relate do diverticulitis  6. PREGNANCY: "Is there any chance you are pregnant?" (e.g., unprotected intercourse, missed  birth control pill, broken condom)     no  Protocols used: Nausea-A-AH

## 2023-12-13 DIAGNOSIS — M25662 Stiffness of left knee, not elsewhere classified: Secondary | ICD-10-CM | POA: Diagnosis not present

## 2023-12-13 DIAGNOSIS — S83262D Peripheral tear of lateral meniscus, current injury, left knee, subsequent encounter: Secondary | ICD-10-CM | POA: Diagnosis not present

## 2023-12-15 DIAGNOSIS — S83262D Peripheral tear of lateral meniscus, current injury, left knee, subsequent encounter: Secondary | ICD-10-CM | POA: Diagnosis not present

## 2023-12-15 DIAGNOSIS — M25662 Stiffness of left knee, not elsewhere classified: Secondary | ICD-10-CM | POA: Diagnosis not present

## 2023-12-19 ENCOUNTER — Ambulatory Visit (INDEPENDENT_AMBULATORY_CARE_PROVIDER_SITE_OTHER): Admitting: Internal Medicine

## 2023-12-19 ENCOUNTER — Encounter: Payer: Self-pay | Admitting: Internal Medicine

## 2023-12-19 VITALS — BP 116/62 | HR 72 | Temp 98.1°F | Resp 16 | Ht 62.0 in | Wt 138.4 lb

## 2023-12-19 DIAGNOSIS — K5792 Diverticulitis of intestine, part unspecified, without perforation or abscess without bleeding: Secondary | ICD-10-CM | POA: Diagnosis not present

## 2023-12-19 NOTE — Progress Notes (Unsigned)
 Subjective:    Patient ID: Karen Case, female    DOB: 11/01/1965, 58 y.o.   MRN: [id number]  DOS:  12/19/2023 Type of visit - description: Acute Symptoms started about a week prior to going to emergency room: Mild fullness and bloating. On the day of the ER evaluation symptoms increase significantly with pain at the LLQ. No fever or chills Has some urinary frequency.  12/05/2023, ER workup reviewed:  CMP okay, CBC showed white count of 12.5, platelets 402 slightly high, hemoglobin normal. Urine few bacteria CT abdomen and pelvis with contrast: Diverticulitis with highly inflamed and somewhat dilated diverticulum. Treated with amoxicillin , hydrocodone , Zofran .   Review of Systems This point she is better. Still has some soreness at the left lower abdomen. Appetite is okay. No fever Minimal nausea but no vomiting. Bowel movements normal.   Past Medical History:  Diagnosis Date   Birth control    husband's vasectomy   Chronic constipation 06/30/2011   GERD (gastroesophageal reflux disease)    IBS (irritable bowel syndrome)    Lumbar radiculopathy     Past Surgical History:  Procedure Laterality Date   BIOPSY TEAR [facility] Right 2012   Right lacrimal gland biopsy, chronic inflammation, no malignancy   BUNIONECTOMY  2000   Right   LIPOMA EXCISION Right 11/22/2019   R arm    Current Outpatient Medications  Medication Instructions   amoxicillin -clavulanate (AUGMENTIN ) 875-125 MG tablet 1 tablet, Oral, Every 12 hours   cholecalciferol (VITAMIN D3) 1,000 Units, Daily   HYDROcodone -acetaminophen  (NORCO/VICODIN) 5-325 MG tablet 1 tablet, Oral, Every 6 hours PRN   linaclotide  (LINZESS ) 290 mcg, Oral, Daily before breakfast   ondansetron  (ZOFRAN -ODT) 4 mg, Oral, Every 8 hours PRN   pantoprazole  (PROTONIX ) 40 mg, Oral, 2 times daily before meals       Objective:   Physical Exam BP 116/62   Pulse 72   Temp 98.1 F (36.7 C) (Oral)   Resp 16   Ht 5\' 2"  (1.575 m)   Wt  138 lb 6 oz (62.8 kg)   LMP 07/15/2012   SpO2 97%   BMI 25.31 kg/m  General:   Well developed, NAD, BMI noted.  HEENT:  Normocephalic . Face symmetric, atraumatic   Abdomen:  Not distended, soft, mildly tender at the left lower quadrant without mass or rebound. Skin: Not pale. Not jaundice Lower extremities: no pretibial edema bilaterally  Neurologic:  alert & oriented X3.  Speech normal, gait: Wearing a brace at the knee, uses a cane Psych--  Cognition and judgment appear intact.  Cooperative with normal attention span and concentration.  Behavior appropriate. No anxious or depressed appearing.     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 Currently improving, Finished antibiotic, exam is benign other than minimal tenderness. Explained the patient what diverticulitis is, continue with a bland diet until she is fully recuperated, after that encouraged a high-fiber diet.  She verbalized understanding. Recommend to let me know if in 2 or 3 weeks she is not back to normal. RTC as scheduled for CPX 02/2024    === Stress: Since last visit, was able to get some counseling.  Stress at work has decreased significantly, she feels well. Neck pain: Saw sports medicine,  resolved. Visual disturbances: As described above, strongly recommend to see her eye doctor ASAP. Check BMP CBC and sed rate. Edema: Occasionally hand or feet edema, recommend low-salt diet. RTC 02-2024 CPX

## 2023-12-20 DIAGNOSIS — M25662 Stiffness of left knee, not elsewhere classified: Secondary | ICD-10-CM | POA: Diagnosis not present

## 2023-12-20 DIAGNOSIS — S83262D Peripheral tear of lateral meniscus, current injury, left knee, subsequent encounter: Secondary | ICD-10-CM | POA: Diagnosis not present

## 2023-12-20 NOTE — Assessment & Plan Note (Signed)
 Diverticulitis:  First episode, Dx via CT abdomen and pelvis 12/05/2023 Two previous C-scope's: 2019, 2022: Polyps, diverticulosis. Currently improving, finished antibiotic, exam is benign other than minimal abdominal tenderness. Explained the patient what diverticulitis is, continue with a bland diet until Karen Case is fully recuperated, after that encouraged a high-fiber diet.  Karen Case verbalized understanding. Recommend to let me know if in 2 or 3 weeks Karen Case is not back to normal. RTC as scheduled for CPX 02/2024

## 2023-12-22 DIAGNOSIS — M25461 Effusion, right knee: Secondary | ICD-10-CM | POA: Diagnosis not present

## 2023-12-22 DIAGNOSIS — S83262D Peripheral tear of lateral meniscus, current injury, left knee, subsequent encounter: Secondary | ICD-10-CM | POA: Diagnosis not present

## 2023-12-22 DIAGNOSIS — M25662 Stiffness of left knee, not elsewhere classified: Secondary | ICD-10-CM | POA: Diagnosis not present

## 2023-12-27 DIAGNOSIS — M25662 Stiffness of left knee, not elsewhere classified: Secondary | ICD-10-CM | POA: Diagnosis not present

## 2023-12-27 DIAGNOSIS — S83262D Peripheral tear of lateral meniscus, current injury, left knee, subsequent encounter: Secondary | ICD-10-CM | POA: Diagnosis not present

## 2023-12-27 NOTE — Progress Notes (Signed)
 12/28/2023 Karen Case [id number] [birth year]  Referring provider: Ezell Hollow, MD Primary GI doctor: Dr. Karene Oto ( Dr. Sandrea Cruel)  ASSESSMENT AND PLAN:   GERD with history of small hiatal hernia No dysphagia, no melena previous H. pylori gastritis eradication study 2022 February 2022 EGD normal esophagus, small HH, gastritis, normal duodenal bulb 11/28/2023 RUQ US  hepatic steatosis unremarkable gallbladder Previously thought to be NSAID induced, she remains on celebrex  increased pantoprazole  40 mg twice daily with continuing GERD, nausea, and early satiety/bloating -plan for EGD at [facility] to evaluate with continuing symptoms despite PPI BID, I discussed risks of EGD with patient today, including risk of sedation, bleeding or perforation.  Patient provides understanding and gave verbal consent to proceed. - consider SIBO treatment if EGD negative - consider gastric emptying study if EGD negative  IBS with constipation 12/2022 CTAP left lower quadrant pain fatty filtration of liver small esophageal hiatal hernia otherwise unremarkable Previously tried and failed MiraLAX, Dulcolax and Trulance  Linzess  290 increased from 145 04/2023 -having BM twice a day, linzess  is working well -No rectal bleeding - continue linzess  290 mcg  Diverticulitis first episode in May , LLQ pain with nausea 12/05/2023 CT abdomen pelvis with contrast in the ER for abdominal pain showed acute diverticulitis with highly inflamed and dilated diverticulum no abscess or extraluminal gas trace free pelvic fluid Previous colonoscopy 08/2020 2 polyps, diverticulosis recall 2029 Treated with Augmentin  Has some minor pain now but normal Bm's, no fever, chills, no nausea, vomiting.  Likely post infectious IBS, avoid gluten/milk, information given Add on FDGard Up to date on colonoscopy 2022 Will call if any further symptoms  History of adenomatous colon polyps 08/20/2020 colonoscopy Dr. Sandrea Cruel good bowel prep 2  polyps 4 to 5 mm transverse ascending colon, mild diverticulosis left colon Recall February 2029  Hepatic steatosis Seen via CT scan 12/17/2022 Unremarkable LFTS Check LFTS and CBC every 6 months, weight loss discussed    Latest Ref Rng & Units 12/05/2023   10:47 AM 10/21/2023    3:53 PM 12/17/2022    2:05 PM  Hepatic Function  Total Protein 6.5 - 8.1 g/dL 7.4  6.8  7.0   Albumin 3.5 - 5.0 g/dL 4.3  4.6  4.1   AST 15 - 41 U/L 16  16  20    ALT 0 - 44 U/L 18  19  23    Alk Phosphatase 38 - 126 U/L 113  84  78   Total Bilirubin 0.0 - 1.2 mg/dL 0.9  0.6  0.7    Left knee OA S/p surgery April 28 Still on celebrex    Patient Care Team: Ezell Hollow, MD as PCP - General (Internal Medicine) Ozan, Jennifer, DO as Consulting Physician (Obstetrics and Gynecology) Merriam Abbey, DO as Consulting Physician (Neurology) Flavia Hughs, PA-C as Physician Assistant (Dermatology) Virl Grimes, MD as Consulting Physician (Orthopedic Surgery)  HISTORY OF PRESENT ILLNESS: 58 y.o. female with a past medical history listed below presents for evaluation of constipation.  Last seen in the office 10/21/2023 for IBS constipation predominant, GERD with small hiatal hernia previous H. pylori treated 2022 with eradication study.  Patient is well-controlled with Linzess  290 mcg daily. Due for surveillance colonoscopy February 2029.  Patient was having increasing reflux over several months possibly NSAID induced here for follow-up.  Discussed the use of AI scribe software for clinical note transcription with the patient, who gave verbal consent to proceed.  History of Present Illness   Karen Case  is a 58 year old female with NSAID-induced gastritis and diverticulitis who presents with ongoing gastrointestinal symptoms.  She has a history of NSAID-induced gastritis, for which her pantoprazole  was increased. She is currently taking pantoprazole  twice daily and experiences reduced heartburn, though it  occasionally persists. No acid regurgitation, trouble swallowing, or dark stools. She experiences bloating and nausea after eating small portions, such as half a chicken sandwich, and her weight fluctuates between 139 and 142 pounds.  Her first episode of diverticulitis occurred on May 26, treated with Augmentin . The pain was severe, located in the left lower quadrant, and initially mistaken for gas. Symptoms began on a Sunday morning after a heavy meal the previous night. By Monday, the pain was so intense she could not walk, prompting an ER visit. She experienced nausea but no fever, diarrhea, or rectal bleeding. Since treatment, she reports no further pain and normal bowel habits.  An ultrasound revealed a fatty liver, but her gallbladder was normal. She denies recent weight loss, diabetes, or recent COVID-19 infection. She has a history of a small hiatal hernia and underwent knee surgery on April 28, after which she took hydrocodone , which she believes may have contributed to her gastrointestinal issues. She is currently taking Celebrex  200 mg as needed for pain.  She maintains a diet of fish, chicken, and salads, but experiences bloating with certain foods, such as pasta and ice cream. She avoids coffee due to heartburn and nausea. She has a history of H. pylori, which was treated and confirmed eradicated. She denies taking NSAIDs like Aleve or ibuprofen currently.      She  reports that she has never smoked. She has never used smokeless tobacco. She reports current alcohol use. She reports that she does not use drugs.  RELEVANT GI HISTORY, IMAGING AND LABS: Results   RADIOLOGY Right upper quadrant ultrasound: Fatty liver, normal gallbladder (12/05/2023) CT scan: Fatty liver (12/05/2023)  DIAGNOSTIC Colonoscopy: Diverticulosis (2022)       CBC    Component Value Date/Time   WBC 12.5 (H) 12/05/2023 1047   RBC 4.14 12/05/2023 1047   HGB 13.3 12/05/2023 1047   HCT 39.3 12/05/2023 1047    PLT 402 (H) 12/05/2023 1047   MCV 94.9 12/05/2023 1047   MCH 32.1 12/05/2023 1047   MCHC 33.8 12/05/2023 1047   RDW 11.4 (L) 12/05/2023 1047   LYMPHSABS 1.9 10/21/2023 1553   MONOABS 0.5 10/21/2023 1553   EOSABS 0.2 10/21/2023 1553   BASOSABS 0.0 10/21/2023 1553   Recent Labs    09/16/23 1340 10/21/23 1553 12/05/23 1047  HGB 13.1 13.1 13.3    CMP     Component Value Date/Time   NA 136 12/05/2023 1047   K 4.0 12/05/2023 1047   CL 98 12/05/2023 1047   CO2 26 12/05/2023 1047   GLUCOSE 107 (H) 12/05/2023 1047   BUN 16 12/05/2023 1047   CREATININE 0.65 12/05/2023 1047   CREATININE 0.72 09/16/2023 1340   CALCIUM 10.1 12/05/2023 1047   PROT 7.4 12/05/2023 1047   ALBUMIN 4.3 12/05/2023 1047   AST 16 12/05/2023 1047   ALT 18 12/05/2023 1047   ALKPHOS 113 12/05/2023 1047   BILITOT 0.9 12/05/2023 1047   GFRNONAA >60 12/05/2023 1047   GFRNONAA 102 12/21/2018 0955   GFRAA 118 12/21/2018 0955      Latest Ref Rng & Units 12/05/2023   10:47 AM 10/21/2023    3:53 PM 12/17/2022    2:05 PM  Hepatic Function  Total Protein  6.5 - 8.1 g/dL 7.4  6.8  7.0   Albumin 3.5 - 5.0 g/dL 4.3  4.6  4.1   AST 15 - 41 U/L 16  16  20    ALT 0 - 44 U/L 18  19  23    Alk Phosphatase 38 - 126 U/L 113  84  78   Total Bilirubin 0.0 - 1.2 mg/dL 0.9  0.6  0.7       Current Medications:      Current Outpatient Medications (Analgesics):    CELEBREX  200 MG capsule, Take 200 mg by mouth 2 (two) times daily.   Current Outpatient Medications (Other):    cholecalciferol (VITAMIN D3) 25 MCG (1000 UT) tablet, Take 1,000 Units by mouth daily.   linaclotide  (LINZESS ) 290 MCG CAPS capsule, Take 1 capsule (290 mcg total) by mouth daily before breakfast.   pantoprazole  (PROTONIX ) 40 MG tablet, Take 1 tablet (40 mg total) by mouth 2 (two) times daily before a meal.  Medical History:  Past Medical History:  Diagnosis Date   Birth control    husband's vasectomy   Chronic constipation 06/30/2011    Diverticulitis    GERD (gastroesophageal reflux disease)    IBS (irritable bowel syndrome)    Lumbar radiculopathy    Allergies: No Known Allergies   Surgical History:  She  has a past surgical history that includes Bunionectomy (2000); Biopsy tear sac (Right, 2012); and Lipoma excision (Right, 11/22/2019). Family History:  Her family history includes Heart attack in her father; Hypertension in her mother.  REVIEW OF SYSTEMS  : All other systems reviewed and negative except where noted in the History of Present Illness.  PHYSICAL EXAM: BP 102/76   Pulse 84   Ht 5' 3 (1.6 m)   Wt 139 lb (63 kg)   LMP 07/15/2012   SpO2 98%   BMI 24.62 kg/m  Physical Exam   GENERAL APPEARANCE: Well nourished, in no apparent distress. HEENT: No cervical lymphadenopathy, unremarkable thyroid , sclerae anicteric, conjunctiva pink. RESPIRATORY: Respiratory effort normal, breath sounds equal bilaterally without rales, rhonchi, or wheezing. CARDIO: Regular rate and rhythm with no murmurs, rubs, or gallops, peripheral pulses intact. ABDOMEN: Soft, non-distended, active bowel sounds in all four quadrants, mild tenderness to palpation LLQ, no rebound, no mass appreciated. RECTAL: Declines. MUSCULOSKELETAL: Full range of motion, normal gait, without edema. SKIN: Dry, intact without rashes or lesions. No jaundice. NEURO: Alert, oriented, no focal deficits. PSYCH: Cooperative, normal mood and affect.      Edmonia Gottron, PA-C 8:50 AM

## 2023-12-28 ENCOUNTER — Ambulatory Visit (INDEPENDENT_AMBULATORY_CARE_PROVIDER_SITE_OTHER): Admitting: Physician Assistant

## 2023-12-28 ENCOUNTER — Encounter: Payer: Self-pay | Admitting: Physician Assistant

## 2023-12-28 VITALS — BP 102/76 | HR 84 | Ht 63.0 in | Wt 139.0 lb

## 2023-12-28 DIAGNOSIS — K219 Gastro-esophageal reflux disease without esophagitis: Secondary | ICD-10-CM | POA: Diagnosis not present

## 2023-12-28 DIAGNOSIS — K581 Irritable bowel syndrome with constipation: Secondary | ICD-10-CM | POA: Insufficient documentation

## 2023-12-28 DIAGNOSIS — K760 Fatty (change of) liver, not elsewhere classified: Secondary | ICD-10-CM | POA: Diagnosis not present

## 2023-12-28 DIAGNOSIS — R140 Abdominal distension (gaseous): Secondary | ICD-10-CM

## 2023-12-28 DIAGNOSIS — K5732 Diverticulitis of large intestine without perforation or abscess without bleeding: Secondary | ICD-10-CM

## 2023-12-28 DIAGNOSIS — R1032 Left lower quadrant pain: Secondary | ICD-10-CM

## 2023-12-28 DIAGNOSIS — R110 Nausea: Secondary | ICD-10-CM

## 2023-12-28 DIAGNOSIS — K5731 Diverticulosis of large intestine without perforation or abscess with bleeding: Secondary | ICD-10-CM | POA: Insufficient documentation

## 2023-12-28 DIAGNOSIS — Z860101 Personal history of adenomatous and serrated colon polyps: Secondary | ICD-10-CM

## 2023-12-28 NOTE — Progress Notes (Signed)
 Agree with the assessment and plan as outlined by Quentin Mulling, PA-C. ? ?Keron Neenan, DO, FACG ? ?

## 2023-12-28 NOTE — Patient Instructions (Addendum)
 Will give samples of FDGard Heating pad for discomfort Avoid gluten and milk for a few weeks   Diverticulosis Diverticulosis is a condition that develops when small pouches (diverticula) form in the wall of the large intestine (colon). The colon is where water is absorbed and stool (feces) is formed. The pouches form when the inside layer of the colon pushes through weak spots in the outer layers of the colon. You may have a few pouches or many of them. The pouches usually do not cause problems unless they become inflamed or infected. When this happens, the condition is called diverticulitis- this is left lower quadrant pain, diarrhea, fever, chills, nausea or vomiting.  If this occurs please call the office or go to the hospital. Sometimes these patches without inflammation can also have painless bleeding associated with them, if this happens please call the office or go to the hospital. Preventing constipation and increasing fiber can help reduce diverticula and prevent complications. Even if you feel you have a high-fiber diet, suggest getting on Benefiber or Cirtracel 2 times daily.  Small intestinal bacterial overgrowth (SIBO) occurs when there is an abnormal increase in the overall bacterial population in the small intestine -- particularly types of bacteria not commonly found in that part of the digestive tract. Small intestinal bacterial overgrowth (SIBO) commonly results when a circumstance -- such as surgery or disease -- slows the passage of food and waste products in the digestive tract, creating a breeding ground for bacteria.  Signs and symptoms of SIBO often include: Loss of appetite Abdominal pain Nausea Bloating An uncomfortable feeling of fullness after eating Diarrhea or constipation, depending on the type of gas produced  What foods trigger SIBO? While foods aren't the original cause of SIBO, certain foods do encourage the overgrowth of the wrong bacteria in your small  intestine. If you're feeding them their favorite foods, they're going to grow more, and that will trigger more of your SIBO symptoms. By the same token, you can help reduce the overgrowth by starving the problematic bacteria of their favorite foods. This strategy has led to a number of proposed SIBO eating plans. The plans vary, and so do individual results. But in general, they tend to recommend limiting carbohydrates.  These include: Sugars and sweeteners. Fruits and starchy vegetables. Dairy products. Grains.  There is a test for this we can do called a breath test, if you are positive we will treat you with an antibiotic to see if it helps.  Your symptoms are very suspicious for this condition, as discussed, we will start you on an antibiotic to see if this helps.     During an Acute Flare-Up (Clear Liquid to Low-Fiber Diet) The goal is to reduce irritation and let your colon rest.  Day 1-3: Clear Liquid Diet Water  Broth (chicken, beef, or vegetable)  Clear juices (apple, white grape - avoid citrus)  Ice pops without pulp or seeds  Gelatin (no fruit or seeds)  Tea or coffee (no cream or dairy)  After Symptoms Improve: Low-Fiber Diet Gradually transition to low-fiber foods for easier digestion.  Sample Foods White rice, pasta, or plain white bread Cooked or canned vegetables without skins or seeds (e.g., carrots, green beans, potatoes) Eggs, fish, or poultry Low-fiber cereals (like cornflakes) Dairy (if tolerated) Ripe bananas, melon, or canned fruit without seeds  Long-Term Maintenance: High-Fiber Diet (Once Fully Recovered) This helps prevent future flare-ups by keeping the bowel movements soft and regular.  High-Fiber Foods Fruits: Apples (peeled),  pears, berries, prunes  Vegetables: Broccoli, spinach, zucchini, peas  Whole grains: Oatmeal, brown rice, quinoa, whole wheat bread  Legumes: Lentils, chickpeas, black beans (start slowly to avoid gas)  Nuts &  seeds: Only if tolerated (research no longer restricts them, but if you feel they cause a flare do not eat them)  Please follow up sooner if symptoms increase or worsen  Due to recent changes in healthcare laws, you may see the results of your imaging and laboratory studies on MyChart before your provider has had a chance to review them.  We understand that in some cases there may be results that are confusing or concerning to you. Not all laboratory results come back in the same time frame and the provider may be waiting for multiple results in order to interpret others.  Please give us  48 hours in order for your provider to thoroughly review all the results before contacting the office for clarification of your results.   Thank you for trusting me with your gastrointestinal care!   Santina Cull, PA-C _______________________________________________________  If your blood pressure at your visit was 140/90 or greater, please contact your primary care physician to follow up on this.  _______________________________________________________  If you are age 48 or older, your body mass index should be between 23-30. Your Body mass index is 24.62 kg/m. If this is out of the aforementioned range listed, please consider follow up with your Primary Care Provider.  If you are age 64 or younger, your body mass index should be between 19-25. Your Body mass index is 24.62 kg/m. If this is out of the aformentioned range listed, please consider follow up with your Primary Care Provider.   ________________________________________________________  The [facility] providers would like to encourage you to use MYCHART to communicate with providers for non-urgent requests or questions.  Due to long hold times on the telephone, sending your provider a message by [facility] may be a faster and more efficient way to get a response.  Please allow 48 business hours for a response.  Please remember that this is for  non-urgent requests.  _______________________________________________________

## 2023-12-29 DIAGNOSIS — S83262D Peripheral tear of lateral meniscus, current injury, left knee, subsequent encounter: Secondary | ICD-10-CM | POA: Diagnosis not present

## 2023-12-29 DIAGNOSIS — M25662 Stiffness of left knee, not elsewhere classified: Secondary | ICD-10-CM | POA: Diagnosis not present

## 2023-12-31 ENCOUNTER — Other Ambulatory Visit: Payer: Self-pay | Admitting: Sports Medicine

## 2024-01-03 DIAGNOSIS — M25662 Stiffness of left knee, not elsewhere classified: Secondary | ICD-10-CM | POA: Diagnosis not present

## 2024-01-03 DIAGNOSIS — S83262D Peripheral tear of lateral meniscus, current injury, left knee, subsequent encounter: Secondary | ICD-10-CM | POA: Diagnosis not present

## 2024-01-05 DIAGNOSIS — S83262D Peripheral tear of lateral meniscus, current injury, left knee, subsequent encounter: Secondary | ICD-10-CM | POA: Diagnosis not present

## 2024-01-05 DIAGNOSIS — M25662 Stiffness of left knee, not elsewhere classified: Secondary | ICD-10-CM | POA: Diagnosis not present

## 2024-01-10 DIAGNOSIS — M25662 Stiffness of left knee, not elsewhere classified: Secondary | ICD-10-CM | POA: Diagnosis not present

## 2024-01-10 DIAGNOSIS — S83262D Peripheral tear of lateral meniscus, current injury, left knee, subsequent encounter: Secondary | ICD-10-CM | POA: Diagnosis not present

## 2024-01-12 DIAGNOSIS — M25662 Stiffness of left knee, not elsewhere classified: Secondary | ICD-10-CM | POA: Diagnosis not present

## 2024-01-12 DIAGNOSIS — S83262D Peripheral tear of lateral meniscus, current injury, left knee, subsequent encounter: Secondary | ICD-10-CM | POA: Diagnosis not present

## 2024-01-17 ENCOUNTER — Other Ambulatory Visit: Payer: Self-pay | Admitting: Internal Medicine

## 2024-01-17 DIAGNOSIS — S83262D Peripheral tear of lateral meniscus, current injury, left knee, subsequent encounter: Secondary | ICD-10-CM | POA: Diagnosis not present

## 2024-01-17 DIAGNOSIS — M25662 Stiffness of left knee, not elsewhere classified: Secondary | ICD-10-CM | POA: Diagnosis not present

## 2024-01-20 DIAGNOSIS — M25662 Stiffness of left knee, not elsewhere classified: Secondary | ICD-10-CM | POA: Diagnosis not present

## 2024-01-20 DIAGNOSIS — S83262D Peripheral tear of lateral meniscus, current injury, left knee, subsequent encounter: Secondary | ICD-10-CM | POA: Diagnosis not present

## 2024-01-22 ENCOUNTER — Other Ambulatory Visit: Payer: Self-pay | Admitting: Physician Assistant

## 2024-01-23 DIAGNOSIS — M25662 Stiffness of left knee, not elsewhere classified: Secondary | ICD-10-CM | POA: Diagnosis not present

## 2024-01-23 DIAGNOSIS — S83262D Peripheral tear of lateral meniscus, current injury, left knee, subsequent encounter: Secondary | ICD-10-CM | POA: Diagnosis not present

## 2024-01-24 DIAGNOSIS — M1712 Unilateral primary osteoarthritis, left knee: Secondary | ICD-10-CM | POA: Diagnosis not present

## 2024-01-25 DIAGNOSIS — S83262D Peripheral tear of lateral meniscus, current injury, left knee, subsequent encounter: Secondary | ICD-10-CM | POA: Diagnosis not present

## 2024-01-25 DIAGNOSIS — M25662 Stiffness of left knee, not elsewhere classified: Secondary | ICD-10-CM | POA: Diagnosis not present

## 2024-01-30 DIAGNOSIS — S83262D Peripheral tear of lateral meniscus, current injury, left knee, subsequent encounter: Secondary | ICD-10-CM | POA: Diagnosis not present

## 2024-01-30 DIAGNOSIS — M25662 Stiffness of left knee, not elsewhere classified: Secondary | ICD-10-CM | POA: Diagnosis not present

## 2024-02-02 DIAGNOSIS — S83262D Peripheral tear of lateral meniscus, current injury, left knee, subsequent encounter: Secondary | ICD-10-CM | POA: Diagnosis not present

## 2024-02-02 DIAGNOSIS — M25662 Stiffness of left knee, not elsewhere classified: Secondary | ICD-10-CM | POA: Diagnosis not present

## 2024-02-07 ENCOUNTER — Encounter: Payer: Self-pay | Admitting: Gastroenterology

## 2024-02-07 ENCOUNTER — Ambulatory Visit: Admitting: Gastroenterology

## 2024-02-07 VITALS — BP 100/63 | HR 98 | Temp 98.7°F | Resp 13 | Ht 63.0 in | Wt 139.0 lb

## 2024-02-07 DIAGNOSIS — K2100 Gastro-esophageal reflux disease with esophagitis, without bleeding: Secondary | ICD-10-CM

## 2024-02-07 DIAGNOSIS — K2970 Gastritis, unspecified, without bleeding: Secondary | ICD-10-CM | POA: Diagnosis not present

## 2024-02-07 DIAGNOSIS — K449 Diaphragmatic hernia without obstruction or gangrene: Secondary | ICD-10-CM | POA: Diagnosis not present

## 2024-02-07 DIAGNOSIS — K222 Esophageal obstruction: Secondary | ICD-10-CM

## 2024-02-07 DIAGNOSIS — R110 Nausea: Secondary | ICD-10-CM

## 2024-02-07 DIAGNOSIS — M25662 Stiffness of left knee, not elsewhere classified: Secondary | ICD-10-CM | POA: Diagnosis not present

## 2024-02-07 DIAGNOSIS — K219 Gastro-esophageal reflux disease without esophagitis: Secondary | ICD-10-CM | POA: Diagnosis not present

## 2024-02-07 DIAGNOSIS — R140 Abdominal distension (gaseous): Secondary | ICD-10-CM

## 2024-02-07 DIAGNOSIS — K2960 Other gastritis without bleeding: Secondary | ICD-10-CM

## 2024-02-07 DIAGNOSIS — S83262D Peripheral tear of lateral meniscus, current injury, left knee, subsequent encounter: Secondary | ICD-10-CM | POA: Diagnosis not present

## 2024-02-07 MED ORDER — SODIUM CHLORIDE 0.9 % IV SOLN: 500.0000 mL | Freq: Once | INTRAVENOUS | Status: DC

## 2024-02-07 MED ORDER — RABEPRAZOLE SODIUM 20 MG PO TBEC: 20.0000 mg | DELAYED_RELEASE_TABLET | Freq: Two times a day (BID) | ORAL | 0 refills | Status: DC

## 2024-02-07 NOTE — Progress Notes (Signed)
 Report to PACU, RN, vss, BBS= Clear.

## 2024-02-07 NOTE — Op Note (Signed)
 [facility] Patient Name: Karen Case Procedure Date: 02/07/2024 11:10 AM MRN: [id number] Endoscopist: Sandor Flatter , MD, [id number] Age: 58 Referring MD:  Date of Birth: Oct 04, 1965 Gender: Female Account #: [id number] Procedure:                Upper GI endoscopy Indications:              Heartburn, Esophageal reflux, Abdominal bloating,                            Dyspepsia, Nausea                           Breakthrough reflux symptoms despite pantoprazole                             40 mg BID. Medicines:                Monitored Anesthesia Care Procedure:                Pre-Anesthesia Assessment:                           - Prior to the procedure, a History and Physical                            was performed, and patient medications and                            allergies were reviewed. The patient's tolerance of                            previous anesthesia was also reviewed. The risks                            and benefits of the procedure and the sedation                            options and risks were discussed with the patient.                            All questions were answered, and informed consent                            was obtained. Prior Anticoagulants: The patient has                            taken no anticoagulant or antiplatelet agents. ASA                            Grade Assessment: II - A patient with mild systemic                            disease. After reviewing the risks and benefits,  the patient was deemed in satisfactory condition to                            undergo the procedure.                           After obtaining informed consent, the endoscope was                            passed under direct vision. Throughout the                            procedure, the patient's blood pressure, pulse, and                            oxygen saturations were monitored continuously. The                             Olympus Scope [id number] was introduced through the                            mouth, and advanced to the third part of duodenum.                            The upper GI endoscopy was accomplished without                            difficulty. The patient tolerated the procedure                            well. Scope In: Scope Out: Findings:                 The upper third of the esophagus and middle third                            of the esophagus were normal.                           LA Grade B (one or more mucosal breaks greater than                            5 mm, not extending between the tops of two mucosal                            folds) esophagitis with no bleeding was found in                            the lower third of the esophagus.                           A non-obstructing Schatzki ring was found in the  lower third of the esophagus.                           A 2 cm hiatal hernia was present.                           The gastroesophageal flap valve was visualized                            endoscopically and classified as Hill Grade III                            (minimal fold, loose to endoscope, hiatal hernia                            likely).                           Diffuse mild inflammation characterized by                            congestion (edema) and erythema was found in the                            gastric fundus and in the gastric body. Biopsies                            were taken with a cold forceps for Helicobacter                            pylori testing. Estimated blood loss was minimal.                           The examined duodenum was normal. Biopsies were                            taken with a cold forceps for histology. Estimated                            blood loss was minimal. Complications:            No immediate complications. Estimated Blood Loss:     Estimated blood loss was  minimal. Impression:               - Normal upper third of esophagus and middle third                            of esophagus.                           - LA Grade B reflux esophagitis with no bleeding.                           - Non-obstructing Schatzki ring.                           -  2 cm hiatal hernia.                           - Gastroesophageal flap valve classified as Hill                            Grade III (minimal fold, loose to endoscope, hiatal                            hernia likely).                           - Gastritis. Biopsied.                           - Normal examined duodenum. Biopsied. Recommendation:           - Patient has a contact number available for                            emergencies. The signs and symptoms of potential                            delayed complications were discussed with the                            patient. Return to normal activities tomorrow.                            Written discharge instructions were provided to the                            patient.                           - Resume previous diet.                           - Await pathology results.                           - Use Aciphex  (rabeprazole ) 20 mg PO BID for 8                            weeks.                           - Stop pantoprazole  when starting the Aciphex .                           - If reflux well controlled on Aciphex , can titrate                            to lowest effective dose.                           - Depending on response to Aciphex , may consider  adding Pepcid at night time vs changing to either                            Dexilant or Voquenza.                           - Return to GI clinic at appointment to be                            scheduled. Sandor Flatter, MD 02/07/2024 11:39:49 AM

## 2024-02-07 NOTE — Progress Notes (Signed)
 GASTROENTEROLOGY PROCEDURE H&P NOTE   Primary Care Physician: Amon Aloysius BRAVO, MD    Reason for Procedure:   GERD, hiatal hernia, nausea, early satiety, bloating, history of H pylori  Plan:    EGD  Patient is appropriate for endoscopic procedure(s) in the ambulatory ([facility]) setting.  The nature of the procedure, as well as the risks, benefits, and alternatives were carefully and thoroughly reviewed with the patient. Ample time for discussion and questions allowed. The patient understood, was satisfied, and agreed to proceed.     HPI: Karen Case is a 58 y.o. female who presents for EGD for evaluation of GERD.   Past Medical History:  Diagnosis Date   Birth control    husband's vasectomy   Chronic constipation 06/30/2011   Diverticulitis    GERD (gastroesophageal reflux disease)    IBS (irritable bowel syndrome)    Lumbar radiculopathy     Past Surgical History:  Procedure Laterality Date   BIOPSY TEAR [facility] Right 2012   Right lacrimal gland biopsy, chronic inflammation, no malignancy   BUNIONECTOMY  2000   Right   left knee surgery Left    LIPOMA EXCISION Right 11/22/2019   R arm    Prior to Admission medications   Medication Sig Start Date End Date Taking? Authorizing Provider  cholecalciferol (VITAMIN D3) 25 MCG (1000 UT) tablet Take 1,000 Units by mouth daily.   Yes [provider]  LINZESS  290 MCG CAPS capsule TAKE 1 CAPSULE DAILY BEFORE BREAKFAST 01/17/24  Yes Avram Lupita BRAVO, MD  pantoprazole  (PROTONIX ) 40 MG tablet TAKE 1 TABLET (40 MG TOTAL) BY MOUTH TWICE A DAY BEFORE MEALS 01/23/24   Craig Alan SAUNDERS, PA-C    Current Outpatient Medications  Medication Sig Dispense Refill   cholecalciferol (VITAMIN D3) 25 MCG (1000 UT) tablet Take 1,000 Units by mouth daily.     LINZESS  290 MCG CAPS capsule TAKE 1 CAPSULE DAILY BEFORE BREAKFAST 90 capsule 3   pantoprazole  (PROTONIX ) 40 MG tablet TAKE 1 TABLET (40 MG TOTAL) BY MOUTH TWICE A DAY BEFORE MEALS 180  tablet 0   Current Facility-Administered Medications  Medication Dose Route Frequency Provider Last Rate Last Admin   0.9 %  sodium chloride  infusion  500 mL Intravenous Once Natalynn Pedone V, DO        Allergies as of 02/07/2024   (No Known Allergies)    Family History  Problem Relation Age of Onset   Hypertension Mother        M   Heart attack Father        10   Hyperlipidemia Neg Hx    Stroke Neg Hx    Colon cancer Neg Hx    Breast cancer Neg Hx    Esophageal cancer Neg Hx    Liver cancer Neg Hx    Pancreatic cancer Neg Hx    Prostate cancer Neg Hx    Rectal cancer Neg Hx    Stomach cancer Neg Hx     Social History   Socioeconomic History   Marital status: Married    Spouse name: Roger   Number of children: 2   Years of education: Not on file   Highest education level: Some college, no degree  Occupational History   Occupation: full time job (school supplies)    Comment: walks several hours qd  Tobacco Use   Smoking status: Never   Smokeless tobacco: Never  Vaping Use   Vaping status: Never Used  Substance and Sexual  Activity   Alcohol use: Yes    Alcohol/week: 0.0 standard drinks of alcohol    Comment: socially    Drug use: No   Sexual activity: Not on file  Other Topics Concern   Not on file  Social History Narrative   Original from [place], moved to [place]  in 1994    2 daughters   [place]ildreth graduated from [employer], lives in [place]     [employer]       Patient is right-handed. She lives with her husband in a 2 level home. She drinks one large mug of coffee a day and sometimes tea. She walks her dog, and before COVID restrictions    Social Drivers of Corporate investment banker Strain: Not on file  Food Insecurity: Not on file  Transportation Needs: Not on file  Physical Activity: Not on file  Stress: Not on file  Social Connections: Unknown (11/23/2021)   Received from [facility]   Social Network    Social Network: Not on file  Intimate  Partner Violence: Unknown (10/15/2021)   Received from [facility]   HITS    Physically Hurt: Not on file    Insult or Talk Down To: Not on file    Threaten Physical Harm: Not on file    Scream or Curse: Not on file    Physical Exam: Vital signs in last 24 hours: @BP  122/68   Pulse 71   Temp 98.7 F (37.1 C)   Ht 5' 3 (1.6 m)   Wt 139 lb (63 kg)   LMP 07/15/2012   SpO2 100%   BMI 24.62 kg/m  GEN: NAD EYE: Sclerae anicteric ENT: MMM CV: Non-tachycardic Pulm: CTA b/l GI: Soft, NT/ND NEURO:  Alert & Oriented x 3   Sandor Flatter, DO [facility]   02/07/2024 11:12 AM

## 2024-02-07 NOTE — Patient Instructions (Signed)

## 2024-02-08 ENCOUNTER — Telehealth: Payer: Self-pay

## 2024-02-08 NOTE — Telephone Encounter (Signed)
 Left message

## 2024-02-09 ENCOUNTER — Telehealth: Payer: Self-pay

## 2024-02-09 DIAGNOSIS — M25662 Stiffness of left knee, not elsewhere classified: Secondary | ICD-10-CM | POA: Diagnosis not present

## 2024-02-09 DIAGNOSIS — S83262D Peripheral tear of lateral meniscus, current injury, left knee, subsequent encounter: Secondary | ICD-10-CM | POA: Diagnosis not present

## 2024-02-09 LAB — SURGICAL PATHOLOGY

## 2024-02-09 NOTE — Telephone Encounter (Signed)
 Left a message on voicemail with the assistance of [employer] ([employer]-ID#[id number]).  Advised patient to return call to the office to schedule clinic appointment with Dr San.  Will continue efforts.

## 2024-02-10 NOTE — Telephone Encounter (Signed)
 Patient returned call and is scheduled to see Dr. San on 8/27 at 9:20

## 2024-02-13 ENCOUNTER — Ambulatory Visit: Payer: Self-pay | Admitting: Gastroenterology

## 2024-02-13 DIAGNOSIS — S83262D Peripheral tear of lateral meniscus, current injury, left knee, subsequent encounter: Secondary | ICD-10-CM | POA: Diagnosis not present

## 2024-02-13 DIAGNOSIS — M25662 Stiffness of left knee, not elsewhere classified: Secondary | ICD-10-CM | POA: Diagnosis not present

## 2024-02-16 DIAGNOSIS — M25662 Stiffness of left knee, not elsewhere classified: Secondary | ICD-10-CM | POA: Diagnosis not present

## 2024-02-16 DIAGNOSIS — S83262D Peripheral tear of lateral meniscus, current injury, left knee, subsequent encounter: Secondary | ICD-10-CM | POA: Diagnosis not present

## 2024-02-20 DIAGNOSIS — S83262D Peripheral tear of lateral meniscus, current injury, left knee, subsequent encounter: Secondary | ICD-10-CM | POA: Diagnosis not present

## 2024-02-20 DIAGNOSIS — M25662 Stiffness of left knee, not elsewhere classified: Secondary | ICD-10-CM | POA: Diagnosis not present

## 2024-02-23 DIAGNOSIS — M25562 Pain in left knee: Secondary | ICD-10-CM | POA: Diagnosis not present

## 2024-02-29 ENCOUNTER — Other Ambulatory Visit: Payer: Self-pay | Admitting: Gastroenterology

## 2024-02-29 DIAGNOSIS — K449 Diaphragmatic hernia without obstruction or gangrene: Secondary | ICD-10-CM

## 2024-02-29 DIAGNOSIS — R110 Nausea: Secondary | ICD-10-CM

## 2024-02-29 DIAGNOSIS — K2100 Gastro-esophageal reflux disease with esophagitis, without bleeding: Secondary | ICD-10-CM

## 2024-03-02 ENCOUNTER — Encounter: Admitting: Internal Medicine

## 2024-03-07 ENCOUNTER — Encounter: Payer: Self-pay | Admitting: Gastroenterology

## 2024-03-07 ENCOUNTER — Ambulatory Visit (INDEPENDENT_AMBULATORY_CARE_PROVIDER_SITE_OTHER): Admitting: Gastroenterology

## 2024-03-07 VITALS — BP 108/68 | HR 69 | Ht 63.0 in | Wt 138.2 lb

## 2024-03-07 DIAGNOSIS — K581 Irritable bowel syndrome with constipation: Secondary | ICD-10-CM | POA: Diagnosis not present

## 2024-03-07 DIAGNOSIS — Z860101 Personal history of adenomatous and serrated colon polyps: Secondary | ICD-10-CM | POA: Diagnosis not present

## 2024-03-07 DIAGNOSIS — K2100 Gastro-esophageal reflux disease with esophagitis, without bleeding: Secondary | ICD-10-CM

## 2024-03-07 DIAGNOSIS — K449 Diaphragmatic hernia without obstruction or gangrene: Secondary | ICD-10-CM

## 2024-03-07 NOTE — Progress Notes (Signed)
 Chief Complaint:    GERD with erosive esophagitis, hiatal hernia  GI History: 58 year old female follows in the GI clinic for the following:  1) GERD with hiatal hernia and erosive esophagitis.  Longstanding history of reflux with index symptoms of heartburn, regurgitation.  EGD in 7//25 with 2 cm hiatal hernia and erosive esophagitis.  Changed pantoprazole  to Aciphex  with good effect.  2) H. pylori gastritis.  Diagnosed on EGD in 2022.  Treated with quadruple therapy with confirmation of eradication.  3) IBS-C.  Previously tried and failed MiraLAX, Dulcolax, Trulance .  Symptoms better controlled with Linzess  290 mcg daily.  3) Diverticulosis with history of diverticulitis.  Diverticulitis in 11/2023, treated with Augmentin .  Did have some postinfectious IBS, treated with FD guard.  4) History of colon polyps.  Colonoscopy in 08/2020 with 2 small 4-5 mm ascending colon adenomas, left-sided diverticulosis with 7-year recall.  5) Hepatic steatosis.  Diagnosed by CT in 12/2022.  Normal liver enzymes.  Endoscopic History: - 08/2020: Colonoscopy: 2 small 4-5 mm ascending colon adenomas, left-sided diverticulosis.  Repeat in 08/2027 - 08/2020: EGD: Small hiatal hernia, mild H. pylori gastritis, normal duodenum, normal esophagus - 02/07/2024: EGD: LA Grade B erosive esophagitis, nonobstructing Schatzki's ring, 2 cm hiatal hernia, Hill grade 3 valve, mild non-H. pylori gastritis, normal duodenum (path benign).  Changed pantoprazole  to Aciphex   HPI:     Patient is a 58 y.o. female presenting to the Gastroenterology Clinic for follow-up.  Was last seen by Alan Coombs on 12/28/2023 for continued breakthrough reflux despite Protonix .  Completed EGD on 02/07/2024 as above, notable for LA Grade B erosive esophagitis and was changed to Aciphex .  Today, she states reflux much better controlled since switching from Protonix  to Aciphex .  Otherwise, no new labs or abdominal imaging for review since last  OV.  Review of systems:     No chest pain, no SOB, no fevers, no urinary sx   Past Medical History:  Diagnosis Date   Birth control    husband's vasectomy   Chronic constipation 06/30/2011   Diverticulitis    GERD (gastroesophageal reflux disease)    IBS (irritable bowel syndrome)    Lumbar radiculopathy     Patient's surgical history, family medical history, social history, medications and allergies were all reviewed in Epic    Current Outpatient Medications  Medication Sig Dispense Refill   cholecalciferol (VITAMIN D3) 25 MCG (1000 UT) tablet Take 1,000 Units by mouth daily.     LINZESS  290 MCG CAPS capsule TAKE 1 CAPSULE DAILY BEFORE BREAKFAST 90 capsule 3   RABEprazole  (ACIPHEX ) 20 MG tablet TAKE 1 TABLET BY MOUTH TWICE A DAY 180 tablet 1   traMADol  (ULTRAM ) 50 MG tablet Take 50 mg by mouth every 6 (six) hours as needed. Usually take 1 tablet 1-2 times a week     No current facility-administered medications for this visit.    Physical Exam:     BP 108/68   Pulse 69   Ht 5' 3 (1.6 m)   Wt 138 lb 4 oz (62.7 kg)   LMP 07/15/2012   BMI 24.49 kg/m   GENERAL:  Pleasant female in NAD PSYCH: : Cooperative, normal affect Musculoskeletal:  Normal muscle tone, normal strength NEURO: Alert and oriented x 3, no focal neurologic deficits   IMPRESSION and PLAN:    1) GERD with erosive esophagitis 2) Hiatal hernia Reflux better controlled since changing from pantoprazole  to Aciphex .  No breakthrough symptoms since starting Aciphex . - Resume Aciphex  20  mg BID for 4 more weeks, then reduce to 20 mg daily to try to titrate to lowest effective dose.  If breakthrough with daily dosing, patient will message me and go back to twice daily dosing - Resume antireflux lifestyle/dietary modifications - Will do routine annual labs at follow-up  3) History of colon polyps - Repeat colonoscopy in 08/2025 for ongoing polyp surveillance  4) IBS-C - Well-controlled on Linzess  290 mcg  daily  5) Hepatic steatosis - Liver enzymes normal - Continue carb modified, fat modified diet   RTC in 1 year or sooner as needed  I spent 30 minutes of time, including in depth chart review, independent review of results as outlined above, communicating results with the patient directly, face-to-face time with the patient, coordinating care, and ordering studies and medications as appropriate, and documentation.           Sandor LULLA Flatter ,DO, FACG 03/07/2024, 9:37 AM

## 2024-03-07 NOTE — Patient Instructions (Addendum)
 _______________________________________________________  If your blood pressure at your visit was 140/90 or greater, please contact your primary care physician to follow up on this.  _______________________________________________________  If you are age 57 or older, your body mass index should be between 23-30. Your Body mass index is 24.49 kg/m. If this is out of the aforementioned range listed, please consider follow up with your Primary Care Provider.  If you are age 50 or younger, your body mass index should be between 19-25. Your Body mass index is 24.49 kg/m. If this is out of the aformentioned range listed, please consider follow up with your Primary Care Provider.   ________________________________________________________  The [facility] providers would like to encourage you to use MYCHART to communicate with providers for non-urgent requests or questions.  Due to long hold times on the telephone, sending your provider a message by [facility] may be a faster and more efficient way to get a response.  Please allow 48 business hours for a response.  Please remember that this is for non-urgent requests.  _______________________________________________________  [facility] is using a team-based approach to care.  Your team is made up of your doctor and two to three APPS. Our APPS (Nurse Practitioners and Physician Assistants) work with your physician to ensure care continuity for you. They are fully qualified to address your health concerns and develop a treatment plan. They communicate directly with your gastroenterologist to care for you. Seeing the Advanced Practice Practitioners on your physician's team can help you by facilitating care more promptly, often allowing for earlier appointments, access to diagnostic testing, procedures, and other specialty referrals.   It was a pleasure to see you today!  Vito Cirigliano, D.O.

## 2024-03-22 DIAGNOSIS — M1712 Unilateral primary osteoarthritis, left knee: Secondary | ICD-10-CM | POA: Diagnosis not present

## 2024-04-11 ENCOUNTER — Encounter: Payer: Self-pay | Admitting: Internal Medicine

## 2024-04-11 ENCOUNTER — Ambulatory Visit (INDEPENDENT_AMBULATORY_CARE_PROVIDER_SITE_OTHER): Admitting: Internal Medicine

## 2024-04-11 VITALS — BP 126/72 | HR 76 | Temp 98.0°F | Resp 16 | Ht 63.0 in | Wt 141.0 lb

## 2024-04-11 DIAGNOSIS — Z0000 Encounter for general adult medical examination without abnormal findings: Secondary | ICD-10-CM

## 2024-04-11 DIAGNOSIS — M8580 Other specified disorders of bone density and structure, unspecified site: Secondary | ICD-10-CM

## 2024-04-11 DIAGNOSIS — Z780 Asymptomatic menopausal state: Secondary | ICD-10-CM

## 2024-04-11 NOTE — Patient Instructions (Addendum)
 GO TO THE LAB :  Get the blood work    Then, go to the front desk for the checkout Please make an appointment for a physical exam in 1 year   STOP BY THE FIRST FLOOR: Arrange a bone density test  Proceed with a COVID booster.  Watch your diet carefully  Stay physically active  Continue vitamin D 

## 2024-04-11 NOTE — Progress Notes (Signed)
 "  Subjective:    Patient ID: Karen Case, female    DOB: 23-Mar-1966, 58 y.o.   MRN: [id number]  DOS:  04/11/2024 Type of visit - description:  CPX  Discussed the use of AI scribe software for clinical note transcription with the patient, who gave verbal consent to proceed.  History of Present Illness  Abdominal discomfort - Experienced a brief episode of lower abdominal discomfort two days ago - Symptoms resolved spontaneously - No associated nausea, vomiting, diarrhea, or blood in stool  - No recent chest pain, shortness of breath, or urinary issues  - Adheres to Linzess  and Aciphex   Musculoskeletal pain - Occasionally uses tramadol  for knee pain   Review of Systems  Other than above, a 14 point review of systems is negative     Past Medical History:  Diagnosis Date   Birth control    husband's vasectomy   Chronic constipation 06/30/2011   Diverticulitis    GERD (gastroesophageal reflux disease)    IBS (irritable bowel syndrome)    Lumbar radiculopathy     Past Surgical History:  Procedure Laterality Date   BIOPSY TEAR [facility] Right 2012   Right lacrimal gland biopsy, chronic inflammation, no malignancy   BUNIONECTOMY  2000   Right   left knee surgery Left 11/07/2023   LIPOMA EXCISION Right 11/22/2019   R arm    Current Outpatient Medications  Medication Instructions   cholecalciferol (VITAMIN D3) 1,000 Units, Daily   LINZESS  290 MCG CAPS capsule TAKE 1 CAPSULE DAILY BEFORE BREAKFAST   RABEprazole  (ACIPHEX ) 20 mg, Oral, 2 times daily   traMADol  (ULTRAM ) 50 mg, Every 6 hours PRN       Objective:   Physical Exam BP 126/72   Pulse 76   Temp 98 F (36.7 C) (Oral)   Resp 16   Ht 5' 3 (1.6 m)   Wt 141 lb (64 kg)   LMP 07/15/2012   SpO2 99%   BMI 24.98 kg/m  General: Well developed, NAD, BMI noted Neck: No  thyromegaly  HEENT:  Normocephalic . Face symmetric, atraumatic Lungs:  CTA B Normal respiratory effort, no intercostal retractions, no  accessory muscle use. Heart: RRR,  no murmur.  Abdomen:  Not distended, soft, non-tender. No rebound or rigidity.   Lower extremities: no pretibial edema bilaterally  Skin: Exposed areas without rash. Not pale. Not jaundice Neurologic:  alert & oriented X3.  Speech normal, gait appropriate for age and unassisted Strength symmetric and appropriate for age.  Psych: Cognition and judgment appear intact.  Cooperative with normal attention span and concentration.  Behavior appropriate. No anxious or depressed appearing.     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    Assessment & Plan Here for CPX -Td 2022 - s/p Shingrix x2 - had a flu shot  -Vaccines I recommend: COVID booster  -CCS:   cscope, 09/2017,Cscope 08/2020, next 2027 per GI note -Female care per gyn-- PAP 11/12/2022 , MMG 11/2023  ([facility]) - labs: See orders - Diet and exercise discussed  Other issues Diverticulitis: See last office visit, symptoms quickly resolved GERD, erosive's esophagitis and hiatal hernia, IBS:  Saw GI 03/07/2024 was Rx to resume Aciphex  20 mg twice daily x 4 weeks then 20 daily For IBS continue Linzess  Next colonoscopy 08/2025 Osteopenia: T-score 02-2022 -  2.2.  Repeat DEXA, check vitamin D  levels RTC 1 year      "

## 2024-04-12 ENCOUNTER — Ambulatory Visit: Payer: Self-pay | Admitting: Internal Medicine

## 2024-04-12 ENCOUNTER — Encounter: Payer: Self-pay | Admitting: Internal Medicine

## 2024-04-12 LAB — LIPID PANEL
Cholesterol: 191 mg/dL (ref 0–200)
HDL: 47.7 mg/dL (ref >39.00)
LDL Cholesterol: 115 mg/dL — ABNORMAL HIGH (ref 0–99)
NonHDL: 143.24
Total CHOL/HDL Ratio: 4
Triglycerides: 142 mg/dL (ref 0.0–149.0)
VLDL: 28.4 mg/dL (ref 0.0–40.0)

## 2024-04-12 LAB — BASIC METABOLIC PANEL WITH GFR
BUN: 15 mg/dL (ref 6–23)
CO2: 29 meq/L (ref 19–32)
Calcium: 9.5 mg/dL (ref 8.4–10.5)
Chloride: 102 meq/L (ref 96–112)
Creatinine, Ser: 0.7 mg/dL (ref 0.40–1.20)
GFR: 95.99 mL/min (ref >60.00)
Glucose, Bld: 86 mg/dL (ref 70–99)
Potassium: 3.6 meq/L (ref 3.5–5.1)
Sodium: 141 meq/L (ref 135–145)

## 2024-04-12 LAB — VITAMIN D 25 HYDROXY (VIT D DEFICIENCY, FRACTURES): VITD: 25.8 ng/mL — ABNORMAL LOW (ref 30.00–100.00)

## 2024-04-12 MED ORDER — VITAMIN D (ERGOCALCIFEROL) 1.25 MG (50000 UNIT) PO CAPS
50000.0000 [IU] | ORAL_CAPSULE | ORAL | 0 refills | Status: AC
Start: 2024-04-12 — End: 2024-07-05

## 2024-04-12 NOTE — Assessment & Plan Note (Signed)
 Here for CPX Other issues Diverticulitis: See last office visit, symptoms quickly resolved GERD, erosive's esophagitis and hiatal hernia, IBS:  Saw GI 03/07/2024 was Rx to resume Aciphex  20 mg twice daily x 4 weeks then 20 daily For IBS continue Linzess  Next colonoscopy 08/2025 Osteopenia: T-score 02-2022 -2.2.  Repeat DEXA, check vitamin D  levels RTC 1 year

## 2024-04-12 NOTE — Assessment & Plan Note (Signed)
 Here for CPX -Td 2022 - s/p Shingrix x2 - had a flu shot  -Vaccines I recommend: COVID booster  -CCS:   cscope, 09/2017,Cscope 08/2020, next 2027 per GI note -Female care per gyn-- PAP 11/12/2022 , MMG 11/2023  ([facility]) - labs: See orders - Diet and exercise discussed

## 2024-05-03 DIAGNOSIS — M25561 Pain in right knee: Secondary | ICD-10-CM | POA: Diagnosis not present

## 2024-05-15 ENCOUNTER — Ambulatory Visit (HOSPITAL_BASED_OUTPATIENT_CLINIC_OR_DEPARTMENT_OTHER)
Admission: RE | Admit: 2024-05-15 | Discharge: 2024-05-15 | Disposition: A | Discharge status: Home / Self Care | Source: Ambulatory Visit | Attending: Internal Medicine | Admitting: Internal Medicine

## 2024-05-15 DIAGNOSIS — Z780 Asymptomatic menopausal state: Secondary | ICD-10-CM | POA: Diagnosis not present

## 2024-05-15 DIAGNOSIS — M8580 Other specified disorders of bone density and structure, unspecified site: Secondary | ICD-10-CM | POA: Diagnosis not present

## 2024-05-15 DIAGNOSIS — M810 Age-related osteoporosis without current pathological fracture: Secondary | ICD-10-CM | POA: Diagnosis not present

## 2024-05-24 DIAGNOSIS — M25561 Pain in right knee: Secondary | ICD-10-CM | POA: Diagnosis not present

## 2024-06-05 DIAGNOSIS — M7061 Trochanteric bursitis, right hip: Secondary | ICD-10-CM | POA: Diagnosis not present

## 2024-06-14 NOTE — Progress Notes (Unsigned)
    Ben Jawad Wiacek D.[facility] [address] [place] [address] Phone: [phone]   Assessment and Plan:     1. Neck pain (Primary) 2. Strain of left trapezius muscle, subsequent encounter 3. Trapezius strain, right, subsequent encounter -Chronic with exacerbation, subsequent visit - Recurrence of neck pain most consistent with bilateral trapezius strain likely due to sleeping position - Start naproxen 200 mg twice daily x2 weeks.  If still having pain after 2 weeks, complete 3rd-week of NSAID. May use remaining NSAID as needed once daily for pain control.  Do not to use additional over-the-counter NSAIDs (ibuprofen, naproxen, Advil, Aleve, etc.) while taking prescription NSAIDs.  May use Tylenol  [phone] mg 2 to 3 times a day for breakthrough pain.  -Restart HEP for neck and trapezius  Pertinent previous records reviewed include none   Follow Up:  as needed if no improved or worsening of symptoms.  Would obtain C-spine x-ray and could consider physical therapy versus trigger point injections versus OMT   Subjective:   I,  , am serving as a neurosurgeon for Doctor Morene Mace  Chief Complaint: neck pain   HPI:  06/15/2024 Patient is a 58 year old female with neck pain. Patient states intermittent  pain started 2 months ago. Pain radiates up her head and down her arm. Tylenol  did not help. Decreased ROM. Tightness when she sleeps. No MOI.    Relevant Historical Information: None pertinent  Additional pertinent review of systems negative.   Current Outpatient Medications:    celecoxib  (CELEBREX ) 200 MG capsule, Take 1 capsule (200 mg total) by mouth 2 (two) times daily., Disp: 60 capsule, Rfl: 0   cholecalciferol (VITAMIN D3) 25 MCG (1000 UT) tablet, Take 1,000 Units by mouth daily., Disp: , Rfl:    LINZESS  290 MCG CAPS capsule, TAKE 1 CAPSULE DAILY BEFORE BREAKFAST, Disp: 90 capsule, Rfl: 3   RABEprazole  (ACIPHEX ) 20 MG  tablet, TAKE 1 TABLET BY MOUTH TWICE A DAY, Disp: 180 tablet, Rfl: 1   traMADol  (ULTRAM ) 50 MG tablet, Take 50 mg by mouth every 6 (six) hours as needed. Usually take 1 tablet 1-2 times a week, Disp: , Rfl:    Vitamin D , Ergocalciferol , (DRISDOL ) 1.25 MG (50000 UNIT) CAPS capsule, Take 1 capsule (50,000 Units total) by mouth every 7 (seven) days., Disp: 12 capsule, Rfl: 0   Objective:     Vitals:   06/15/24 1123  Pulse: 78  SpO2: 98%  Weight: 142 lb (64.4 kg)  Height: 5' 3 (1.6 m)      Body mass index is 25.15 kg/m.    Physical Exam:     Neck Exam: Cervical Spine- Posture normal Skin- normal, intact  Neuro:  Strength-  Right Left   Deltoid (C5) 5/5 5/5  Bicep/Brachioradialis (C5/6) 5/5  5/5  Wrist Extension (C6) 5/5 5/5  Tricep (C7) 5/5 5/5  Wrist Flexion (C7) 5/5 5/5  Grip (C8) 5/5 5/5  Finger Abduction (T1) 5/5 5/5   Sensation: intact to light touch in upper extremities bilaterally  Spurling's:  negative bilaterally Neck ROM: Full active ROM with tension and discomfort with sidebending and rotation TTP: Bilateral trapezius, bilateral cervical paraspinal NTTP: cervical spinous processes, , thoracic paraspinal,     Electronically signed by:  Odis Mace D.[facility] 11:37 AM 06/15/24

## 2024-06-15 ENCOUNTER — Ambulatory Visit: Admitting: Sports Medicine

## 2024-06-15 VITALS — HR 78 | Ht 63.0 in | Wt 142.0 lb

## 2024-06-15 DIAGNOSIS — M7061 Trochanteric bursitis, right hip: Secondary | ICD-10-CM | POA: Diagnosis not present

## 2024-06-15 DIAGNOSIS — S46811D Strain of other muscles, fascia and tendons at shoulder and upper arm level, right arm, subsequent encounter: Secondary | ICD-10-CM | POA: Diagnosis not present

## 2024-06-15 DIAGNOSIS — S46812D Strain of other muscles, fascia and tendons at shoulder and upper arm level, left arm, subsequent encounter: Secondary | ICD-10-CM | POA: Diagnosis not present

## 2024-06-15 DIAGNOSIS — M542 Cervicalgia: Secondary | ICD-10-CM

## 2024-06-15 MED ORDER — CELECOXIB 200 MG PO CAPS: 200.0000 mg | ORAL_CAPSULE | Freq: Two times a day (BID) | ORAL | 0 refills | Status: AC

## 2024-06-15 NOTE — Patient Instructions (Addendum)
-   Start Celebrex  200 mg 2x daily .  If still having pain after 2 weeks, complete 3rd-week of NSAID. May use remaining NSAID as needed once daily for pain control.  Do not to use additional over-the-counter NSAIDs (ibuprofen, naproxen, Advil, Aleve, etc.) while taking prescription NSAIDs.  May use Tylenol  [phone] mg 2 to 3 times a day for breakthrough pain.  Recommend using heat   Neck HEP   As needed follow up

## 2024-06-19 DIAGNOSIS — M25552 Pain in left hip: Secondary | ICD-10-CM | POA: Diagnosis not present

## 2024-06-19 DIAGNOSIS — S83271D Complex tear of lateral meniscus, current injury, right knee, subsequent encounter: Secondary | ICD-10-CM | POA: Diagnosis not present

## 2024-06-25 DIAGNOSIS — M7061 Trochanteric bursitis, right hip: Secondary | ICD-10-CM | POA: Diagnosis not present

## 2024-06-28 ENCOUNTER — Other Ambulatory Visit: Payer: Self-pay | Admitting: Family

## 2024-06-28 DIAGNOSIS — M7061 Trochanteric bursitis, right hip: Secondary | ICD-10-CM | POA: Diagnosis not present

## 2024-06-28 MED ORDER — ALENDRONATE SODIUM 70 MG PO TABS: 70.0000 mg | ORAL_TABLET | ORAL | 11 refills | Status: AC

## 2024-06-29 DIAGNOSIS — M25552 Pain in left hip: Secondary | ICD-10-CM | POA: Diagnosis not present

## 2024-07-03 ENCOUNTER — Other Ambulatory Visit: Payer: Self-pay | Admitting: Internal Medicine

## 2024-07-03 DIAGNOSIS — S73192A Other sprain of left hip, initial encounter: Secondary | ICD-10-CM | POA: Diagnosis not present

## 2024-07-09 DIAGNOSIS — M7061 Trochanteric bursitis, right hip: Secondary | ICD-10-CM | POA: Diagnosis not present

## 2024-07-10 DIAGNOSIS — M1612 Unilateral primary osteoarthritis, left hip: Secondary | ICD-10-CM | POA: Diagnosis not present

## 2024-07-14 ENCOUNTER — Other Ambulatory Visit: Payer: Self-pay | Admitting: Sports Medicine

## 2025-04-12 ENCOUNTER — Encounter: Admitting: Internal Medicine
